# Patient Record
Sex: Male | Born: 1950
Health system: Southern US, Community
[De-identification: ages and names within clinical notes are randomized; demographics above are authoritative.]

## PROBLEM LIST (undated history)

## (undated) DIAGNOSIS — I5020 Unspecified systolic (congestive) heart failure: Secondary | ICD-10-CM

## (undated) DIAGNOSIS — Z8739 Personal history of other diseases of the musculoskeletal system and connective tissue: Secondary | ICD-10-CM

## (undated) DIAGNOSIS — D689 Coagulation defect, unspecified: Secondary | ICD-10-CM

## (undated) DIAGNOSIS — R51 Headache: Secondary | ICD-10-CM

## (undated) DIAGNOSIS — J45909 Unspecified asthma, uncomplicated: Secondary | ICD-10-CM

## (undated) DIAGNOSIS — I639 Cerebral infarction, unspecified: Secondary | ICD-10-CM

## (undated) DIAGNOSIS — R519 Headache, unspecified: Secondary | ICD-10-CM

## (undated) DIAGNOSIS — I671 Cerebral aneurysm, nonruptured: Secondary | ICD-10-CM

## (undated) DIAGNOSIS — I509 Heart failure, unspecified: Secondary | ICD-10-CM

## (undated) DIAGNOSIS — R0602 Shortness of breath: Secondary | ICD-10-CM

## (undated) DIAGNOSIS — M1990 Unspecified osteoarthritis, unspecified site: Secondary | ICD-10-CM

## (undated) DIAGNOSIS — Z973 Presence of spectacles and contact lenses: Secondary | ICD-10-CM

## (undated) DIAGNOSIS — D573 Sickle-cell trait: Secondary | ICD-10-CM

## (undated) DIAGNOSIS — K08109 Complete loss of teeth, unspecified cause, unspecified class: Secondary | ICD-10-CM

## (undated) DIAGNOSIS — Z972 Presence of dental prosthetic device (complete) (partial): Secondary | ICD-10-CM

## (undated) DIAGNOSIS — I10 Essential (primary) hypertension: Secondary | ICD-10-CM

## (undated) HISTORY — PX: NM PET  LYMPHOMA INITIAL: HXRAD81

## (undated) HISTORY — PX: MULTIPLE TOOTH EXTRACTIONS: SHX2053

## (undated) HISTORY — PX: CARDIAC CATHETERIZATION: SHX172

## (undated) HISTORY — DX: Unspecified systolic (congestive) heart failure: I50.20

## (undated) HISTORY — PX: HERNIA REPAIR: SHX51

---
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
## 1988-12-24 DIAGNOSIS — I639 Cerebral infarction, unspecified: Secondary | ICD-10-CM

## 1988-12-24 HISTORY — DX: Cerebral infarction, unspecified: I63.9

## 1989-01-11 DIAGNOSIS — I671 Cerebral aneurysm, nonruptured: Secondary | ICD-10-CM

## 1989-01-11 HISTORY — DX: Cerebral aneurysm, nonruptured: I67.1

## 2013-01-18 ENCOUNTER — Encounter (HOSPITAL_COMMUNITY): Payer: Self-pay | Admitting: Emergency Medicine

## 2013-01-18 ENCOUNTER — Inpatient Hospital Stay (HOSPITAL_COMMUNITY)
Admission: EM | Admit: 2013-01-18 | Discharge: 2013-01-23 | DRG: 286 | Disposition: A | Discharge status: Home / Self Care | Payer: Medicaid Other | Attending: Internal Medicine | Admitting: Internal Medicine

## 2013-01-18 ENCOUNTER — Emergency Department (HOSPITAL_COMMUNITY): Payer: Medicaid Other

## 2013-01-18 DIAGNOSIS — I080 Rheumatic disorders of both mitral and aortic valves: Secondary | ICD-10-CM | POA: Diagnosis present

## 2013-01-18 DIAGNOSIS — I110 Hypertensive heart disease with heart failure: Secondary | ICD-10-CM | POA: Diagnosis present

## 2013-01-18 DIAGNOSIS — I5022 Chronic systolic (congestive) heart failure: Secondary | ICD-10-CM

## 2013-01-18 DIAGNOSIS — I130 Hypertensive heart and chronic kidney disease with heart failure and stage 1 through stage 4 chronic kidney disease, or unspecified chronic kidney disease: Secondary | ICD-10-CM | POA: Diagnosis present

## 2013-01-18 DIAGNOSIS — I2510 Atherosclerotic heart disease of native coronary artery without angina pectoris: Secondary | ICD-10-CM | POA: Diagnosis present

## 2013-01-18 DIAGNOSIS — N400 Enlarged prostate without lower urinary tract symptoms: Secondary | ICD-10-CM | POA: Diagnosis present

## 2013-01-18 DIAGNOSIS — I2789 Other specified pulmonary heart diseases: Secondary | ICD-10-CM | POA: Diagnosis present

## 2013-01-18 DIAGNOSIS — J45909 Unspecified asthma, uncomplicated: Secondary | ICD-10-CM

## 2013-01-18 DIAGNOSIS — F1210 Cannabis abuse, uncomplicated: Secondary | ICD-10-CM | POA: Diagnosis present

## 2013-01-18 DIAGNOSIS — I5023 Acute on chronic systolic (congestive) heart failure: Principal | ICD-10-CM | POA: Diagnosis present

## 2013-01-18 DIAGNOSIS — J811 Chronic pulmonary edema: Secondary | ICD-10-CM

## 2013-01-18 DIAGNOSIS — I5021 Acute systolic (congestive) heart failure: Secondary | ICD-10-CM

## 2013-01-18 DIAGNOSIS — I129 Hypertensive chronic kidney disease with stage 1 through stage 4 chronic kidney disease, or unspecified chronic kidney disease: Secondary | ICD-10-CM | POA: Diagnosis present

## 2013-01-18 DIAGNOSIS — J984 Other disorders of lung: Secondary | ICD-10-CM

## 2013-01-18 DIAGNOSIS — Z9119 Patient's noncompliance with other medical treatment and regimen: Secondary | ICD-10-CM | POA: Diagnosis present

## 2013-01-18 DIAGNOSIS — I509 Heart failure, unspecified: Secondary | ICD-10-CM

## 2013-01-18 DIAGNOSIS — I10 Essential (primary) hypertension: Secondary | ICD-10-CM

## 2013-01-18 DIAGNOSIS — Z91199 Patient's noncompliance with other medical treatment and regimen due to unspecified reason: Secondary | ICD-10-CM | POA: Diagnosis present

## 2013-01-18 DIAGNOSIS — N182 Chronic kidney disease, stage 2 (mild): Secondary | ICD-10-CM

## 2013-01-18 DIAGNOSIS — R0603 Acute respiratory distress: Secondary | ICD-10-CM

## 2013-01-18 DIAGNOSIS — N183 Chronic kidney disease, stage 3 unspecified: Secondary | ICD-10-CM | POA: Diagnosis present

## 2013-01-18 DIAGNOSIS — Z87891 Personal history of nicotine dependence: Secondary | ICD-10-CM

## 2013-01-18 DIAGNOSIS — I379 Nonrheumatic pulmonary valve disorder, unspecified: Secondary | ICD-10-CM

## 2013-01-18 DIAGNOSIS — M129 Arthropathy, unspecified: Secondary | ICD-10-CM | POA: Diagnosis present

## 2013-01-18 DIAGNOSIS — N179 Acute kidney failure, unspecified: Secondary | ICD-10-CM

## 2013-01-18 DIAGNOSIS — I079 Rheumatic tricuspid valve disease, unspecified: Secondary | ICD-10-CM | POA: Diagnosis present

## 2013-01-18 DIAGNOSIS — I160 Hypertensive urgency: Secondary | ICD-10-CM

## 2013-01-18 DIAGNOSIS — Z8673 Personal history of transient ischemic attack (TIA), and cerebral infarction without residual deficits: Secondary | ICD-10-CM | POA: Diagnosis present

## 2013-01-18 DIAGNOSIS — J9620 Acute and chronic respiratory failure, unspecified whether with hypoxia or hypercapnia: Secondary | ICD-10-CM | POA: Diagnosis present

## 2013-01-18 DIAGNOSIS — D573 Sickle-cell trait: Secondary | ICD-10-CM | POA: Diagnosis present

## 2013-01-18 HISTORY — DX: Unspecified osteoarthritis, unspecified site: M19.90

## 2013-01-18 HISTORY — DX: Unspecified asthma, uncomplicated: J45.909

## 2013-01-18 HISTORY — DX: Heart failure, unspecified: I50.9

## 2013-01-18 HISTORY — DX: Essential (primary) hypertension: I10

## 2013-01-18 HISTORY — DX: Sickle-cell trait: D57.3

## 2013-01-18 HISTORY — DX: Shortness of breath: R06.02

## 2013-01-18 HISTORY — DX: Cerebral infarction, unspecified: I63.9

## 2013-01-18 LAB — CBC
MCV: 93.9 fL (ref 78.0–100.0)
Platelets: 232 10*3/uL (ref 150–400)
RBC: 4.43 MIL/uL (ref 4.22–5.81)
RDW: 14.4 % (ref 11.5–15.5)
WBC: 12 10*3/uL — ABNORMAL HIGH (ref 4.0–10.5)

## 2013-01-18 LAB — BASIC METABOLIC PANEL
Calcium: 9.3 mg/dL (ref 8.4–10.5)
GFR calc Af Amer: 52 mL/min — ABNORMAL LOW (ref >90)
GFR calc non Af Amer: 45 mL/min — ABNORMAL LOW (ref >90)
Glucose, Bld: 135 mg/dL — ABNORMAL HIGH (ref 70–99)
Sodium: 141 mEq/L (ref 135–145)

## 2013-01-18 LAB — CBC WITH DIFFERENTIAL/PLATELET
Basophils Absolute: 0 10*3/uL (ref 0.0–0.1)
Basophils Relative: 0 % (ref 0–1)
Eosinophils Absolute: 0.1 10*3/uL (ref 0.0–0.7)
Eosinophils Relative: 1 % (ref 0–5)
HCT: 41.7 % (ref 39.0–52.0)
Lymphocytes Relative: 15 % (ref 12–46)
MCH: 31.8 pg (ref 26.0–34.0)
MCHC: 33.8 g/dL (ref 30.0–36.0)
MCV: 93.9 fL (ref 78.0–100.0)
Platelets: 272 10*3/uL (ref 150–400)
RDW: 14.5 % (ref 11.5–15.5)
WBC: 12.8 10*3/uL — ABNORMAL HIGH (ref 4.0–10.5)

## 2013-01-18 LAB — TROPONIN I
Troponin I: <0.3 ng/mL (ref <0.30)
Troponin I: <0.3 ng/mL (ref <0.30)

## 2013-01-18 LAB — TSH: TSH: 1.124 u[IU]/mL (ref 0.350–4.500)

## 2013-01-18 LAB — POCT I-STAT, CHEM 8
Calcium, Ion: 1.19 mmol/L (ref 1.13–1.30)
HCT: 46 % (ref 39.0–52.0)
Hemoglobin: 15.6 g/dL (ref 13.0–17.0)
TCO2: 30 mmol/L (ref 0–100)

## 2013-01-18 LAB — POCT I-STAT TROPONIN I: Troponin i, poc: 0.02 ng/mL (ref 0.00–0.08)

## 2013-01-18 LAB — CREATININE, SERUM
Creatinine, Ser: 1.56 mg/dL — ABNORMAL HIGH (ref 0.50–1.35)
GFR calc Af Amer: 54 mL/min — ABNORMAL LOW (ref >90)

## 2013-01-18 LAB — URINALYSIS, ROUTINE W REFLEX MICROSCOPIC
Hgb urine dipstick: NEGATIVE
Protein, ur: NEGATIVE mg/dL
Urobilinogen, UA: 0.2 mg/dL (ref 0.0–1.0)
pH: 7 (ref 5.0–8.0)

## 2013-01-18 LAB — PRO B NATRIURETIC PEPTIDE: Pro B Natriuretic peptide (BNP): 6048 pg/mL — ABNORMAL HIGH (ref 0–125)

## 2013-01-18 MED ORDER — ONDANSETRON HCL 4 MG/2ML IJ SOLN: 4.0000 mg | Freq: Four times a day (QID) | INTRAMUSCULAR | Status: DC | PRN

## 2013-01-18 MED ORDER — PNEUMOCOCCAL VAC POLYVALENT 25 MCG/0.5ML IJ INJ
0.5000 mL | INJECTION | INTRAMUSCULAR | Status: AC
  Administered 2013-01-20: 0.5 mL via INTRAMUSCULAR
  Filled 2013-01-18 (×2): qty 0.5

## 2013-01-18 MED ORDER — ASPIRIN EC 325 MG PO TBEC: 325.0000 mg | DELAYED_RELEASE_TABLET | Freq: Every day | ORAL | Status: DC

## 2013-01-18 MED ORDER — SODIUM CHLORIDE 0.9 % IV SOLN: 250.0000 mL | INTRAVENOUS | Status: DC | PRN

## 2013-01-18 MED ORDER — POTASSIUM CHLORIDE CRYS ER 20 MEQ PO TBCR: 40.0000 meq | EXTENDED_RELEASE_TABLET | Freq: Every day | ORAL | Status: DC

## 2013-01-18 MED ORDER — HEPARIN SODIUM (PORCINE) 5000 UNIT/ML IJ SOLN
5000.0000 [IU] | Freq: Three times a day (TID) | INTRAMUSCULAR | Status: DC
  Administered 2013-01-18 – 2013-01-23 (×14): 5000 [IU] via SUBCUTANEOUS
  Filled 2013-01-18 (×19): qty 1

## 2013-01-18 MED ORDER — SODIUM CHLORIDE 0.9 % IJ SOLN
3.0000 mL | Freq: Two times a day (BID) | INTRAMUSCULAR | Status: DC
  Administered 2013-01-18 – 2013-01-20 (×2): 3 mL via INTRAVENOUS

## 2013-01-18 MED ORDER — FUROSEMIDE 10 MG/ML IJ SOLN
80.0000 mg | Freq: Once | INTRAMUSCULAR | Status: AC
  Administered 2013-01-18: 80 mg via INTRAVENOUS
  Filled 2013-01-18: qty 8

## 2013-01-18 MED ORDER — POTASSIUM CHLORIDE CRYS ER 20 MEQ PO TBCR
40.0000 meq | EXTENDED_RELEASE_TABLET | Freq: Every day | ORAL | Status: DC
  Administered 2013-01-18 – 2013-01-23 (×6): 40 meq via ORAL
  Filled 2013-01-18 (×6): qty 2

## 2013-01-18 MED ORDER — INFLUENZA VAC SPLIT QUAD 0.5 ML IM SUSP
0.5000 mL | INTRAMUSCULAR | Status: DC
  Filled 2013-01-18: qty 0.5

## 2013-01-18 MED ORDER — FUROSEMIDE 10 MG/ML IJ SOLN
INTRAMUSCULAR | Status: AC
  Filled 2013-01-18: qty 4

## 2013-01-18 MED ORDER — FUROSEMIDE 10 MG/ML IJ SOLN
40.0000 mg | Freq: Once | INTRAMUSCULAR | Status: AC
  Administered 2013-01-18: 40 mg via INTRAVENOUS
  Filled 2013-01-18: qty 4

## 2013-01-18 MED ORDER — ONDANSETRON HCL 4 MG PO TABS: 4.0000 mg | ORAL_TABLET | Freq: Four times a day (QID) | ORAL | Status: DC | PRN

## 2013-01-18 MED ORDER — CARVEDILOL 6.25 MG PO TABS
6.2500 mg | ORAL_TABLET | Freq: Two times a day (BID) | ORAL | Status: DC
  Administered 2013-01-18 – 2013-01-23 (×11): 6.25 mg via ORAL
  Filled 2013-01-18 (×13): qty 1

## 2013-01-18 MED ORDER — DOCUSATE SODIUM 100 MG PO CAPS
100.0000 mg | ORAL_CAPSULE | Freq: Two times a day (BID) | ORAL | Status: DC
  Administered 2013-01-20: 100 mg via ORAL
  Filled 2013-01-18 (×12): qty 1

## 2013-01-18 MED ORDER — SODIUM CHLORIDE 0.9 % IJ SOLN: 3.0000 mL | INTRAMUSCULAR | Status: DC | PRN

## 2013-01-18 MED ORDER — FUROSEMIDE 40 MG PO TABS
40.0000 mg | ORAL_TABLET | Freq: Every day | ORAL | Status: DC
  Filled 2013-01-18: qty 1

## 2013-01-18 MED ORDER — ASPIRIN 81 MG PO CHEW
324.0000 mg | CHEWABLE_TABLET | Freq: Once | ORAL | Status: AC
  Administered 2013-01-18: 324 mg via ORAL
  Filled 2013-01-18: qty 4

## 2013-01-18 MED ORDER — NITROGLYCERIN 2 % TD OINT
1.0000 [in_us] | TOPICAL_OINTMENT | Freq: Once | TRANSDERMAL | Status: AC
  Administered 2013-01-18: 1 [in_us] via TOPICAL
  Filled 2013-01-18: qty 1

## 2013-01-18 MED ORDER — FUROSEMIDE 10 MG/ML IJ SOLN: 40.0000 mg | Freq: Two times a day (BID) | INTRAMUSCULAR | Status: DC

## 2013-01-18 MED ORDER — SODIUM CHLORIDE 0.9 % IJ SOLN
3.0000 mL | Freq: Two times a day (BID) | INTRAMUSCULAR | Status: DC
  Administered 2013-01-18 – 2013-01-22 (×7): 3 mL via INTRAVENOUS

## 2013-01-18 MED ORDER — DOXAZOSIN MESYLATE 8 MG PO TABS
8.0000 mg | ORAL_TABLET | Freq: Every day | ORAL | Status: DC
  Administered 2013-01-18 – 2013-01-21 (×4): 8 mg via ORAL
  Filled 2013-01-18 (×5): qty 1

## 2013-01-18 MED ORDER — METOPROLOL TARTRATE 1 MG/ML IV SOLN
2.5000 mg | INTRAVENOUS | Status: DC | PRN
  Administered 2013-01-18: 2.5 mg via INTRAVENOUS
  Filled 2013-01-18: qty 5

## 2013-01-18 MED ORDER — ASPIRIN 81 MG PO CHEW
81.0000 mg | CHEWABLE_TABLET | Freq: Every day | ORAL | Status: DC
  Administered 2013-01-18 – 2013-01-23 (×6): 81 mg via ORAL
  Filled 2013-01-18 (×5): qty 1

## 2013-01-18 MED ORDER — FUROSEMIDE 10 MG/ML IJ SOLN
40.0000 mg | Freq: Two times a day (BID) | INTRAMUSCULAR | Status: DC
  Administered 2013-01-19 – 2013-01-23 (×8): 40 mg via INTRAVENOUS
  Filled 2013-01-18 (×12): qty 4

## 2013-01-18 NOTE — Progress Notes (Signed)
  Echocardiogram 2D Echocardiogram has been performed.  [name] [name] 01/18/2013, 4:27 PM

## 2013-01-18 NOTE — ED Notes (Signed)
MD at bedside. 

## 2013-01-18 NOTE — H&P (Signed)
[facility] History and Physical  Tudor Chandley ZOX:[id number] DOB: [birth year]    PCP:   None.  Chief Complaint: shortness of breath and orthopnea.  HPI: [name] [name] is an 62 y.o. male with hx of longstanding HTN on Lisinopril/HCT combination for many years, hx of asthma, prior CVA, presents to the ER as he has progressive increase shortness of breath, increased DOE, and orthopnea for the past 3 weeks.  He denied chest pain, fever, chills or sputum productive coughs.  He was seen at the ER in [place], [place], where he used to live in March 2014, and was diagnosed with fluid overload where upon he was given Lasix 40mg  per day.  He continued to take this diuretic since then, without any blood work.  As far as he can tell, he never had any "kidney problem" before. He also has been taking Moloxicam for his arthritis.  He has hip pain and was contemplating hip surgery.   Evalaution in the ER tonight showed CXR with cardiomegaly, Cr of 1.8, K of 3.3 mEq/L, and EKG showed nonspecific ST T changes.  His BNP was elevated to 6K, and troponin was negative.  He was given 80mg  IV lasix, with some improvement of his breathing, and he started to diurese. He was found to be hypertensive with BP 170/130.  Hospitalist was asked to admit him for HTN CMP and pulmonary edema.  Rewiew of Systems:  Constitutional: Negative for malaise, fever and chills. No significant weight loss or weight gain Eyes: Negative for eye pain, redness and discharge, diplopia, visual changes, or flashes of light. ENMT: Negative for ear pain, hoarseness, nasal congestion, sinus pressure and sore throat. No headaches; tinnitus, drooling, or problem swallowing. Cardiovascular: Negative for chest pain, palpitations, diaphoresis. Respiratory: Negative for cough, hemoptysis, wheezing and stridor. No pleuritic chestpain. Gastrointestinal: Negative for nausea, vomiting, diarrhea, constipation, abdominal pain, melena, blood in stool,  hematemesis, jaundice and rectal bleeding.    Genitourinary: Negative for frequency, dysuria, incontinence,flank pain and hematuria; Musculoskeletal: Negative for back pain and neck pain. Negative for swelling and trauma.;  Skin: . Negative for pruritus, rash, abrasions, bruising and skin lesion.; ulcerations Neuro: Negative for headache, lightheadedness and neck stiffness. Negative for weakness, altered level of consciousness , altered mental status, extremity weakness, burning feet, involuntary movement, seizure and syncope.  Psych: negative for anxiety, depression, insomnia, tearfulness, panic attacks, hallucinations, paranoia, suicidal or homicidal ideation.   Past Medical History  Diagnosis Date  . Hypertension   . Stroke   . Asthma     Past Surgical History  Procedure Laterality Date  . Hernia repair    . Nm pet  lymphoma initial      Medications:  HOME MEDS: Prior to Admission medications   Medication Sig Start Date End Date Taking? Authorizing Provider  doxazosin (CARDURA) 8 MG tablet Take 8 mg by mouth daily.   Yes Historical Provider, MD  furosemide (LASIX) 40 MG tablet Take 40 mg by mouth daily.   Yes Historical Provider, MD  lisinopril-hydrochlorothiazide (PRINZIDE,ZESTORETIC) 20-25 MG per tablet Take 1 tablet by mouth daily.   Yes Historical Provider, MD  MAGNESIUM PO Take 1 tablet by mouth daily. OTC   Yes Historical Provider, MD  meloxicam (MOBIC) 15 MG tablet Take 15 mg by mouth daily.   Yes Historical Provider, MD  POTASSIUM PO Take 1 tablet by mouth daily. OTC   Yes Historical Provider, MD     Allergies:  No Known Allergies  Social History:   reports that  he has quit smoking. He does not have any smokeless tobacco history on file. He reports that he does not drink alcohol or use illicit drugs.  Family History: No family history on file.   Physical Exam: Filed Vitals:   01/18/13 0226 01/18/13 0230 01/18/13 0300 01/18/13 0403  BP: 165/108 150/107 170/60  170/100  Pulse: 98 96 96   Temp: 97.6 F (36.4 C)  98 F (36.7 C) 98 F (36.7 C)  TempSrc: Oral  Oral Oral  Resp: 24 33 23 18  SpO2: 93% 99% 98% 100%   Blood pressure 170/100, pulse 96, temperature 98 F (36.7 C), temperature source Oral, resp. rate 18, SpO2 100.00%.  GEN:  Pleasant patient lying in the stretcher in no acute distress; cooperative with exam. PSYCH:  alert and oriented x4; does not appear anxious or depressed; affect is appropriate. HEENT: Mucous membranes pink and anicteric; PERRLA; EOM intact; no cervical lymphadenopathy nor thyromegaly or carotid bruit; no JVD; There were no stridor. Neck is very supple. Breasts:: Not examined CHEST WALL: No tenderness CHEST: Normal respiration, No wheezing but he has bilateral rales. HEART: Regular rate and rhythm.  There are no murmur, rub, or gallops.   BACK: No kyphosis or scoliosis; no CVA tenderness ABDOMEN: soft and non-tender; no masses, no organomegaly, normal abdominal bowel sounds; no pannus; no intertriginous candida. There is no rebound and no distention. Rectal Exam: Not done EXTREMITIES: No bone or joint deformity; age-appropriate arthropathy of the hands and knees; no edema; no ulcerations.  There is no calf tenderness. Genitalia: not examined PULSES: 2+ and symmetric SKIN: Normal hydration no rash or ulceration CNS: Cranial nerves 2-12 grossly intact no focal lateralizing neurologic deficit.  Speech is fluent; uvula elevated with phonation, facial symmetry and tongue midline. DTR are normal bilaterally, cerebella exam is intact, barbinski is negative and strengths are equaled bilaterally.  No sensory loss.   Labs on Admission:  Basic Metabolic Panel:  Recent Labs Lab 01/18/13 0228 01/18/13 0230  NA 144 141  K 3.3* 3.3*  CL 101 100  CO2  --  30  GLUCOSE 136* 135*  BUN 24* 25*  CREATININE 1.80* 1.62*  CALCIUM  --  9.3   Liver Function Tests: No results found for this basename: AST, ALT, ALKPHOS, BILITOT,  PROT, ALBUMIN,  in the last 168 hours No results found for this basename: LIPASE, AMYLASE,  in the last 168 hours No results found for this basename: AMMONIA,  in the last 168 hours CBC:  Recent Labs Lab 01/18/13 0221 01/18/13 0228  WBC 12.8*  --   NEUTROABS 10.0*  --   HGB 14.1 15.6  HCT 41.7 46.0  MCV 93.9  --   PLT 272  --    Cardiac Enzymes: No results found for this basename: CKTOTAL, CKMB, CKMBINDEX, TROPONINI,  in the last 168 hours  CBG: No results found for this basename: GLUCAP,  in the last 168 hours   Radiological Exams on Admission: Dg Chest Port 1 View  01/18/2013   CLINICAL DATA:  Shortness of breath, history of hypertension, stroke and asthma.  EXAM: PORTABLE CHEST - 1 VIEW  COMPARISON:  None available for comparison at time of study interpretation.  FINDINGS: The cardiac silhouette appears mild to moderately enlarged. Mediastinal silhouette is unremarkable. Linear densities in right lung base. No pleural effusions or focal consolidations. No pneumothorax.  Soft tissue planes and included osseous structures are nonsuspicious. Moderate degenerative change of the right shoulder. Multiple EKG lines overlie the patient  and may obscure subtle underlying pathology.  IMPRESSION: Cardiomegaly ; right lower lobe atelectasis versus scarring.   Electronically Signed   By: Awilda Metro   On: 01/18/2013 02:46    EKG: Independently reviewed. NSR with nonspecific ST-T segment changes.   Assessment/Plan Present on Admission:  . CHF (congestive heart failure) . HTN (hypertension) . Asthma . AKI (acute kidney injury)  PLAN:  His shortness of breath is concerning for CHF.  I suspect his main problem is that of hypertensive cardiomyopathy, but I cannot exclude ischemic CMP.  His AKI is likely a combination of Moloxicam, ACE-I, and could be cardiorenal syndrome.  Unfortunately, if he has CKD, his baseline GFR is unclear.  I don't think his shortness of breath is his asthma  though he has hx of it.  Will admit him for further Tx.  For his HTN, will continue Cardura, and add Coreg at 6.25mg  BID to his regimen.  I will replenish his K, and d/c his lisinopril Hct and d/c Mobic.  He will need an ECHO of his heart, TSH, and will add nitropaste to his regimen for now.  Will obtain UA.  Currently he is stable, full code, and will be admitted to telemetry under [facility] service.  We will proceed to further evaluation at this time.  Record from [place] has been requested but will not be available at this time.  Thank you for asking me to participate in his care.  I explained in detail about his condition to him and his family, and all questions were answered.    Other plans as per orders.  Code Status: FULL Unk Lightning, MD. [facility] Pager [phone] 7pm to 7am.  01/18/2013, 4:21 AM

## 2013-01-18 NOTE — ED Notes (Signed)
Manly, MD at bedside.  

## 2013-01-18 NOTE — ED Notes (Signed)
Old records on 01/2012 unable to be obtain till the am dr Nedra Hai aware

## 2013-01-18 NOTE — ED Notes (Addendum)
Sob, congested in throat, heaviness, productive cough - clear. No fever/chills. When laying supine feels more sob. Noticed some swelling in ankles. Symptoms x 2 weeks.

## 2013-01-18 NOTE — ED Provider Notes (Signed)
CSN: [id number]     Arrival date & time 01/18/13  0210 History   First MD Initiated Contact with Patient 01/18/13 0214     Chief Complaint  Patient presents with  . Shortness of Breath   (Consider location/radiation/quality/duration/timing/severity/associated sxs/prior Treatment) HPI Mr. Breton is a pleasant 62 yo man with a history of HTN, stroke and asthma who presents with complaints of approximately 8 hrs of increasingly severe SOB. The patient reports a mild cough productive of frothy sputum. He denies chest pain. Denies fever. He has had 2 weeks of orthopnea and PND which has significantly interfered with his sleep.   Patient has noticed lower extremity dependent edema for the past 3 days and doubled his dose of lasix yesterday and the day before.  Denies any other changes in medications.   The patient reports that he recently moved to [place] from [facility], [place] and does not yet have a physician. He reports that he was admitted to [facility] in [place] earlier in the year for treatment of similar sx and started on diuretic at discharge. But, says he has not been told of a dx of CHF.   Past Medical History  Diagnosis Date  . Hypertension   . Stroke   . Asthma    Past Surgical History  Procedure Laterality Date  . Hernia repair    . Nm pet  lymphoma initial     No family history on file. History  Substance Use Topics  . Smoking status: Former Games developer  . Smokeless tobacco: Not on file  . Alcohol Use: No    Review of Systems Ten point review of symptoms performed and is negative with the exception of symptoms noted above.   Allergies  Review of patient's allergies indicates no known allergies.  Home Medications  Physical Exam Gen: well developed and well nourished appearing Head: NCAT Eyes: PERL, EOMI Nose: no epistaixis or rhinorrhea Mouth/throat: mucosa is moist and pink Neck: supple, no stridor Lungs: fiant bibiasilar rales, RR 28 to 32/min with  use of abdominal musculature, no wheezing, rhonchi or rales CV: rapid and regular rate and rythm, good distal pulses.  Abd: soft, obese, notender, nondistended Back: no ttp, no cva ttp Skin: warm and dry Ext: no edema, normal to inspection Neuro: CN ii-xii grossly intact, no focal deficits Psyche; normal affect,  calm and cooperative. ED Course  Procedures (including critical care time) Labs Review      DG Chest Port 1 View (Final result)  Result time: 01/18/13 02:46:22    Procedure changed from [facility] Chest Portable 2 Views       Final result by Rad Results In Interface (01/18/13 02:46:22)    Narrative:   CLINICAL DATA: Shortness of breath, history of hypertension, stroke and asthma.  EXAM: PORTABLE CHEST - 1 VIEW  COMPARISON: None available for comparison at time of study interpretation.  FINDINGS: The cardiac silhouette appears mild to moderately enlarged. Mediastinal silhouette is unremarkable. Linear densities in right lung base. No pleural effusions or focal consolidations. No pneumothorax.  Soft tissue planes and included osseous structures are nonsuspicious. Moderate degenerative change of the right shoulder. Multiple EKG lines overlie the patient and may obscure subtle underlying pathology.  IMPRESSION: Cardiomegaly ; right lower lobe atelectasis versus scarring.  Results for orders placed during the hospital encounter of 01/18/13 (from the past 24 hour(s))  CBC WITH DIFFERENTIAL     Status: Abnormal   Collection Time    01/18/13  2:21 AM  Result Value Range   WBC 12.8 (*) 4.0 - 10.5 K/uL   RBC 4.44  4.22 - 5.81 MIL/uL   Hemoglobin 14.1  13.0 - 17.0 g/dL   HCT 40.9  81.1 - 91.4 %   MCV 93.9  78.0 - 100.0 fL   MCH 31.8  26.0 - 34.0 pg   MCHC 33.8  30.0 - 36.0 g/dL   RDW 78.2  95.6 - 21.3 %   Platelets 272  150 - 400 K/uL   Neutrophils Relative % 78 (*) 43 - 77 %   Neutro Abs 10.0 (*) 1.7 - 7.7 K/uL   Lymphocytes Relative 15  12 - 46 %   Lymphs Abs  1.9  0.7 - 4.0 K/uL   Monocytes Relative 6  3 - 12 %   Monocytes Absolute 0.8  0.1 - 1.0 K/uL   Eosinophils Relative 1  0 - 5 %   Eosinophils Absolute 0.1  0.0 - 0.7 K/uL   Basophils Relative 0  0 - 1 %   Basophils Absolute 0.0  0.0 - 0.1 K/uL  PRO B NATRIURETIC PEPTIDE     Status: Abnormal   Collection Time    01/18/13  2:21 AM      Result Value Range   Pro B Natriuretic peptide (BNP) 6048.0 (*) 0 - 125 pg/mL  POCT I-STAT TROPONIN I     Status: None   Collection Time    01/18/13  2:27 AM      Result Value Range   Troponin i, poc 0.02  0.00 - 0.08 ng/mL   Comment 3           POCT I-STAT, CHEM 8     Status: Abnormal   Collection Time    01/18/13  2:28 AM      Result Value Range   Sodium 144  135 - 145 mEq/L   Potassium 3.3 (*) 3.5 - 5.1 mEq/L   Chloride 101  96 - 112 mEq/L   BUN 24 (*) 6 - 23 mg/dL   Creatinine, Ser 0.86 (*) 0.50 - 1.35 mg/dL   Glucose, Bld 578 (*) 70 - 99 mg/dL   Calcium, Ion 4.69  6.29 - 1.30 mmol/L   TCO2 30  0 - 100 mmol/L   Hemoglobin 15.6  13.0 - 17.0 g/dL   HCT 52.8  41.3 - 24.4 %    Electronically Signed By: Awilda Metro On: 01/18/2013 02:46    MDM    CRITICAL CARE Performed by: Brandt Loosen   Total critical care time 83m  Critical care time was exclusive of separately billable procedures and treating other patients.  Critical care was necessary to treat or prevent imminent or life-threatening deterioration.  Critical care was time spent personally by me on the following activities: development of treatment plan with patient and/or surrogate as well as nursing, discussions with consultants, evaluation of patient's response to treatment, examination of patient, obtaining history from patient or surrogate, ordering and performing treatments and interventions, ordering and review of laboratory studies, ordering and review of radiographic studies, pulse oximetry and re-evaluation of patient's condition.  Patient with acute respiratory  distress and hypertension. Hx and exam most consistent with pulmonary edema, although the patient denies any knowledge of history of cardiomyopathy. We are treating HTN with NTG and treating with IV Lasix. Awaiting results of BNP. First troponin is wnl and there are no acute ischemic changes identified on the patient's EKG. He is afebrile. Although, his sx could be consistent with  influenza/ILI.  We will check a d dimer as well. Anticipate admission.   I have requested copy of the patient's 2014 discharge summary from [facility] in [place], [place].     Brandt Loosen, MD 01/31/13 [phone]

## 2013-01-18 NOTE — Progress Notes (Signed)
Patient seen and examined admitted after midnight secondary to SOB and increase LE swelling. Patient with vasc congestion on CXR and elevated BNP. He reported medication non compliance and diet indiscretion. Referred to Dr. Irwin Brakeman H&P for further info/details on admission.   Plan: -continue IV lasix -follow 2D echo -Strict I's and O's -daily weights and low sodium diet   [name] [phone]

## 2013-01-19 DIAGNOSIS — J811 Chronic pulmonary edema: Secondary | ICD-10-CM

## 2013-01-19 DIAGNOSIS — I509 Heart failure, unspecified: Secondary | ICD-10-CM

## 2013-01-19 DIAGNOSIS — I5021 Acute systolic (congestive) heart failure: Secondary | ICD-10-CM

## 2013-01-19 DIAGNOSIS — N179 Acute kidney failure, unspecified: Secondary | ICD-10-CM

## 2013-01-19 LAB — BASIC METABOLIC PANEL
CO2: 32 mEq/L (ref 19–32)
Chloride: 101 mEq/L (ref 96–112)
Creatinine, Ser: 1.5 mg/dL — ABNORMAL HIGH (ref 0.50–1.35)
GFR calc Af Amer: 57 mL/min — ABNORMAL LOW (ref >90)
Potassium: 3.3 mEq/L — ABNORMAL LOW (ref 3.5–5.1)
Sodium: 141 mEq/L (ref 135–145)

## 2013-01-19 MED ORDER — LIVING BETTER WITH HEART FAILURE BOOK
Freq: Once | Status: AC
  Administered 2013-01-23: 15:00:00
  Filled 2013-01-19: qty 1

## 2013-01-19 MED ORDER — ISOSORB DINITRATE-HYDRALAZINE 20-37.5 MG PO TABS
1.0000 | ORAL_TABLET | Freq: Two times a day (BID) | ORAL | Status: DC
  Administered 2013-01-19 – 2013-01-21 (×6): 1 via ORAL
  Filled 2013-01-19 (×11): qty 1

## 2013-01-19 NOTE — Progress Notes (Signed)
At 2132 pt had 7 beats of Vtach/ SVT. Pt asymptomatic. Strip posted and M. Burnadette Peter, NP made aware. No new orders. Cont to monitor.

## 2013-01-19 NOTE — Consult Note (Addendum)
CONSULT NOTE  Date: 01/19/2013               Patient Name:  [name] [name] MRN: [id number]  DOB: 1950-11-26 Age / Sex: 62 y.o., male        PCP: No PCP Per Patient Primary Cardiologist: New/Nahser            Referring Physician: Vassie Loll, MD              Reason for Consult: CHf           History of Present Illness: Patient is a 62 y.o. male  From [place]. with a PMHx of hypertension, asthma, previous CVA, cerebral aneurism, who was admitted to [facility] on 01/18/2013 for evaluation of progressive shortness of breath, orthopnea for the past several weeks. He denies any chest pain.[name] He has mild to moderate chronic renal insufficiency at baseline.  He's previously been diagnosed with congestive heart failure when he lived out of [place]. He received Lasix but apparently has not been taking it on a regular basis..  An echocardiogram performed yesterday reveals moderately reduced left ventricle systolic function with an ejection fraction of 35-40%. He has diffuse hypokinesis. There is mild aortic insufficiency, mild mitral regurgitation, moderate tricuspid regurgitation and moderate pulmonic insufficiency. There is moderate pulmonary hypertension with an estimated PA pressure of 49.  He has a long hx of HTN - skips his meds frequently.   He runs out of meds.  He eats salty foods.   Rarely eats fast foods.  He prefers to do his own cooking.   He recently moved from [place].  He reports 2-3 weeks of progressive dyspnea.   No fever.  Has had a cough. White , frothy sputum.     Medications: Outpatient medications: Prescriptions prior to admission  Medication Sig Dispense Refill  . doxazosin (CARDURA) 8 MG tablet Take 8 mg by mouth daily.      . furosemide (LASIX) 40 MG tablet Take 40 mg by mouth daily.      [name] lisinopril-hydrochlorothiazide (PRINZIDE,ZESTORETIC) 20-25 MG per tablet Take 1 tablet by mouth daily.      [name] MAGNESIUM PO Take 1 tablet by mouth daily. OTC      .  meloxicam (MOBIC) 15 MG tablet Take 15 mg by mouth daily.      [name] POTASSIUM PO Take 1 tablet by mouth daily. OTC        Current medications: Current Facility-Administered Medications  Medication Dose Route Frequency Provider Last Rate Last Dose  . 0.9 %  sodium chloride infusion  250 mL Intravenous PRN Houston Siren, MD      . aspirin chewable tablet 81 mg  81 mg Oral Daily Vassie Loll, MD   81 mg at 01/19/13 1135  . carvedilol (COREG) tablet 6.25 mg  6.25 mg Oral BID WC Houston Siren, MD   6.25 mg at 01/19/13 4098  . docusate sodium (COLACE) capsule 100 mg  100 mg Oral BID Houston Siren, MD      . doxazosin (CARDURA) tablet 8 mg  8 mg Oral Daily Houston Siren, MD   8 mg at 01/19/13 1135  . furosemide (LASIX) injection 40 mg  40 mg Intravenous Q12H Vassie Loll, MD   40 mg at 01/19/13 0610  . heparin injection 5,000 Units  5,000 Units Subcutaneous Q8H Houston Siren, MD   5,000 Units at 01/19/13 [phone]  . influenza vac split quadrivalent PF (FLUARIX) injection 0.5 mL  0.5 mL  Intramuscular Tomorrow-1000 Houston Siren, MD      . isosorbide-hydrALAZINE (BIDIL) 20-37.5 MG per tablet 1 tablet  1 tablet Oral BID Vassie Loll, MD   1 tablet at 01/19/13 1318  . ondansetron (ZOFRAN) tablet 4 mg  4 mg Oral Q6H PRN Houston Siren, MD       Or  . ondansetron [facility]) injection 4 mg  4 mg Intravenous Q6H PRN Houston Siren, MD      . pneumococcal 23 valent vaccine (PNU-IMMUNE) injection 0.5 mL  0.5 mL Intramuscular Tomorrow-1000 Houston Siren, MD      . potassium chloride SA (K-DUR,KLOR-CON) CR tablet 40 mEq  40 mEq Oral Daily Houston Siren, MD   40 mEq at 01/19/13 1135  . sodium chloride 0.9 % injection 3 mL  3 mL Intravenous Q12H Houston Siren, MD   3 mL at 01/18/13 1051  . sodium chloride 0.9 % injection 3 mL  3 mL Intravenous Q12H Houston Siren, MD   3 mL at 01/19/13 1146  . sodium chloride 0.9 % injection 3 mL  3 mL Intravenous PRN Houston Siren, MD         No Known Allergies   Past Medical History  Diagnosis Date  . Hypertension   . CHF (congestive heart  failure)     "just today" (01/18/2013)  . Asthma     'as a child"   . Shortness of breath     "comes on at anytime" (01/18/2013)  . Sickle cell trait   . Stroke 12/1988  . Arthritis     "left hip; right knee" (01/18/2013)    Past Surgical History  Procedure Laterality Date  . 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 Hernia repair  2004?    "above the navel" (01/18/2013)  . Cardiac catheterization  12/1988    History reviewed. No pertinent family history.  Social History:  reports that he has never smoked. He has never used smokeless tobacco. He reports that he drinks alcohol. He reports that he uses illicit drugs (Marijuana).   Review of Systems: Constitutional:  denies fever, chills, diaphoresis, appetite change and fatigue.  HEENT: denies photophobia, eye pain, redness, hearing loss, ear pain, congestion, sore throat, rhinorrhea, sneezing, neck pain, neck stiffness and tinnitus.  Respiratory: admits to SOB, DOE, cough, chest tightness, and wheezing.  Cardiovascular: denies chest pain, palpitations and leg swelling.  Gastrointestinal: denies nausea, vomiting, abdominal pain, diarrhea, constipation, blood in stool.  Genitourinary: denies dysuria, urgency, frequency, hematuria, flank pain and difficulty urinating.  Musculoskeletal: denies  myalgias, back pain, joint swelling, arthralgias and gait problem.   Skin: denies pallor, rash and wound.  Neurological: denies dizziness, seizures, syncope, weakness, light-headedness, numbness and headaches.   Hematological: denies adenopathy, easy bruising, personal or family bleeding history.  Psychiatric/ Behavioral: denies suicidal ideation, mood changes, confusion, nervousness, sleep disturbance and agitation.    Physical Exam: BP 141/97  Pulse 67  Temp(Src) 97.5 F (36.4 C) (Oral)  Resp 17  Ht 6\' 4"  (1.93 m)  Wt 269 lb (122.018 kg)  BMI 32.76 kg/m2  SpO2 93%  General: Vital signs reviewed and noted. Well-developed,  well-nourished, in no acute distress; alert, appropriate and cooperative throughout examination.  Head: Normocephalic, atraumatic, sclera anicteric, mucus membranes are moist  Neck: Supple. Negative for carotid bruits. JVD not elevated.  Lungs:  Clear bilaterally to auscultation without wheezes, rales, or rhonchi. Breathing is unlabored.  Heart: RRR with S1 S2. No murmurs, rubs, or gallops  Abdomen:  Soft, non-tender, non-distended with normoactive bowel sounds. No hepatomegaly. No rebound/guarding. No obvious abdominal masses  MSK: Strength and the appear normal for age.  Extremities: No clubbing or cyanosis. No edema.  Distal pedal pulses are 2+ and equal bilaterally.  Neurologic: Alert and oriented X 3. Moves all extremities spontaneously.  Psych: Responds to questions appropriately with a normal affect.    Lab results: Basic Metabolic Panel:  Recent Labs Lab 01/18/13 0228 01/18/13 0230 01/18/13 0645  NA 144 141  --   K 3.3* 3.3*  --   CL 101 100  --   CO2  --  30  --   GLUCOSE 136* 135*  --   BUN 24* 25*  --   CREATININE 1.80* 1.62* 1.56*  CALCIUM  --  9.3  --     Liver Function Tests: No results found for this basename: AST, ALT, ALKPHOS, BILITOT, PROT, ALBUMIN,  in the last 168 hours No results found for this basename: LIPASE, AMYLASE,  in the last 168 hours No results found for this basename: AMMONIA,  in the last 168 hours  CBC:  Recent Labs Lab 01/18/13 0221 01/18/13 0228 01/18/13 0645  WBC 12.8*  --  12.0*  NEUTROABS 10.0*  --   --   HGB 14.1 15.6 13.9  HCT 41.7 46.0 41.6  MCV 93.9  --  93.9  PLT 272  --  232    Cardiac Enzymes:  Recent Labs Lab 01/18/13 0645 01/18/13 1158 01/18/13 1916  TROPONINI <0.30 <0.30 <0.30    BNP: No components found with this basename: POCBNP,   CBG: No results found for this basename: GLUCAP,  in the last 168 hours  Coagulation Studies: No results found for this basename: LABPROT, INR,  in the last 72  hours   Other results:  EK    Imaging: Dg Chest Port 1 View  01/18/2013   CLINICAL DATA:  Shortness of breath, history of hypertension, stroke and asthma.  EXAM: PORTABLE CHEST - 1 VIEW  COMPARISON:  None available for comparison at time of study interpretation.  FINDINGS: The cardiac silhouette appears mild to moderately enlarged. Mediastinal silhouette is unremarkable. Linear densities in right lung base. No pleural effusions or focal consolidations. No pneumothorax.  Soft tissue planes and included osseous structures are nonsuspicious. Moderate degenerative change of the right shoulder. Multiple EKG lines overlie the patient and may obscure subtle underlying pathology.  IMPRESSION: Cardiomegaly ; right lower lobe atelectasis versus scarring.   Electronically Signed   By: Awilda Metro   On: 01/18/2013 02:46      Last Echo (01/18/13) Left ventricle: The cavity size was moderately dilated. Wall thickness was increased in a pattern of moderate LVH. Systolic function was moderately reduced. The estimated ejection fraction was in the range of 35% to 40%. Diffuse hypokinesis. Regional wall motion abnormalities cannot be excluded. The study is not technically sufficient to allow evaluation of LV diastolic function. - Aortic valve: Mild regurgitation. - Mitral valve: Mild regurgitation. - Left atrium: The atrium was mildly dilated. - Right ventricle: The cavity size was mildly dilated. - Right atrium: The atrium was mildly dilated. - Tricuspid valve: Moderate regurgitation. - Pulmonic valve: Moderate regurgitation. - Pulmonary arteries: PA peak pressure: 49mm Hg      Assessment & Plan:  1. Acute on Chronic systolic congestive heart failure:  The patient presents with acute on chronic systolic congestive heart failure. His ejection fraction by echo was 35-40%. I suspect that the etiology is  due to long-standing, poorly controlled hypertension. He also takes nonsteroidal  anti-inflammatory agents.  He's never had episodes of angina but I cannot completely exclude coronary artery disease.  He is already feeling better having received Lasix. He has had some leg edema which has since resolved.  We will schedule him for a right left heart catheterization on Monday. We will start him on carvedilol. We will resume his lisinopril. We will also continue Lasix and will likely at Aldactone.  I have asked him to check with his medical Dr. to see if he can arthritis medications that are not nonsteroidal anti-inflammatory agents.  We talked about the importance of sticking to a good diet and exercise program. He does have lots of arthritis in his right knee and left hip which seem to limit his activity.  2. Hypertension: The patient has long-standing hypertension. He admits to not taking his medications all the time. I stressed the importance of a good low-salt diet.  3. Chronic renal insufficiency: I suspect this is also due to is chronic hypertension. He also has been taking nonsteroidal anti-inflammatory agents for quite some time. Further evaluation per the internal medicine team.  Alvia Grove., MD, [facility] 01/19/2013, 2:44 PM

## 2013-01-19 NOTE — Progress Notes (Signed)
[facility] PROGRESS NOTE  Filed Weights   01/18/13 0513 01/19/13 0500  Weight: 122.743 kg (270 lb 9.6 oz) 122.018 kg (269 lb)        Intake/Output Summary (Last 24 hours) at 01/19/13 1531 Last data filed at 01/19/13 1300  Gross per 24 hour  Intake    600 ml  Output   2075 ml  Net  -1475 ml     Assessment/Plan: 1-acute resp failure with hypoxia due to acute on chronic systolic heart failure: -continue daily weight, strict I's and O's and low sodium diet -will continue IV lasix one more day and transition to PO/adjust dose base on clinical response -start bidil BID (better BP controlled and improved perfusion) -will hold lisinopril until patient received heart cath; at that time if renal function remains stable will resume -PRN oxygen saturation -follow cardiology rec's -daily BMET and electrolytes repletion as needed  2-HTN (hypertension): uncontrolled. Will add bidil. Continue lasix and coreg. Plan as per cardiology,  is for aldactone and lisinopril after cath if renal function stable.  3-Asthma: no wheezing and SOB improved with diuresis. -continue PRN albuterol  4-AKI (acute kidney injury): unknown of baseline creatinine but suspect underline renal disease base on GFR.  -will monitor for now -continue holding lisinopril -Cr has improved some with diuresis.  5-BPH: continue cardura  DVT: heparin    Code Status: Full Family Communication: wife at bedside Disposition Plan: right and left cath on Monday, continue inpatient therapy   Consultants:  Cardiology (Dr. Elease Hashimoto)  Procedures: ECHO: diffuse hypokinesis, EF 35-40%; valvular disease with mild AI, MR and moderate tricuspid regurgitation and pulmonic insufficiency  Antibiotics:  None   HPI/Subjective: Feeling better and breathing better. No edema on his legs.  Objective: Filed Vitals:   01/19/13 0500 01/19/13 0917 01/19/13 1112 01/19/13 1344  BP: 137/106 157/114 138/100 141/97  Pulse: 71 72 72  67  Temp: 98.5 F (36.9 C)   97.5 F (36.4 C)  TempSrc:    Oral  Resp: 20   17  Height:      Weight: 122.018 kg (269 lb)     SpO2: 99%   93%     Exam:  General: Alert, awake, oriented x3, in no acute distress; reports he is breathing better.  HEENT: No bruits, no goiter and no JVD Heart: Regular rate and rhythm, without rubs or gallops. Positive SEM. No edema on LE Lungs: improved air movement bilaterally, still mild decreased at bases; no frank crackles  Abdomen: Soft, nontender, nondistended, positive bowel sounds.  Neuro: Grossly intact, nonfocal.   Data Reviewed: Basic Metabolic Panel:  Recent Labs Lab 01/18/13 0228 01/18/13 0230 01/18/13 0645 01/19/13 1320  NA 144 141  --  141  K 3.3* 3.3*  --  3.3*  CL 101 100  --  101  CO2  --  30  --  32  GLUCOSE 136* 135*  --  90  BUN 24* 25*  --  27*  CREATININE 1.80* 1.62* 1.56* 1.50*  CALCIUM  --  9.3  --  8.6   CBC:  Recent Labs Lab 01/18/13 0221 01/18/13 0228 01/18/13 0645  WBC 12.8*  --  12.0*  NEUTROABS 10.0*  --   --   HGB 14.1 15.6 13.9  HCT 41.7 46.0 41.6  MCV 93.9  --  93.9  PLT 272  --  232   Cardiac Enzymes:  Recent Labs Lab 01/18/13 0645 01/18/13 1158 01/18/13 1916  TROPONINI <0.30 <0.30 <0.30   BNP (last  3 results)  Recent Labs  01/18/13 0221  PROBNP 6048.0*    Studies: Dg Chest Port 1 View  01/18/2013   CLINICAL DATA:  Shortness of breath, history of hypertension, stroke and asthma.  EXAM: PORTABLE CHEST - 1 VIEW  COMPARISON:  None available for comparison at time of study interpretation.  FINDINGS: The cardiac silhouette appears mild to moderately enlarged. Mediastinal silhouette is unremarkable. Linear densities in right lung base. No pleural effusions or focal consolidations. No pneumothorax.  Soft tissue planes and included osseous structures are nonsuspicious. Moderate degenerative change of the right shoulder. Multiple EKG lines overlie the patient and may obscure subtle  underlying pathology.  IMPRESSION: Cardiomegaly ; right lower lobe atelectasis versus scarring.   Electronically Signed   By: Awilda Metro   On: 01/18/2013 02:46    Scheduled Meds: . aspirin  81 mg Oral Daily  . carvedilol  6.25 mg Oral BID WC  . docusate sodium  100 mg Oral BID  . doxazosin  8 mg Oral Daily  . furosemide  40 mg Intravenous Q12H  . heparin  5,000 Units Subcutaneous Q8H  . influenza vac split quadrivalent PF  0.5 mL Intramuscular Tomorrow-1000  . isosorbide-hydrALAZINE  1 tablet Oral BID  . pneumococcal 23 valent vaccine  0.5 mL Intramuscular Tomorrow-1000  . potassium chloride  40 mEq Oral Daily  . sodium chloride  3 mL Intravenous Q12H  . sodium chloride  3 mL Intravenous Q12H   Continuous Infusions:    Declin Rajan  [facility] Pager [phone]. If 8PM-8AM, please contact night-coverage at [email or web address], password [facility] 01/19/2013, 3:31 PM  LOS: 1 day

## 2013-01-20 DIAGNOSIS — J9600 Acute respiratory failure, unspecified whether with hypoxia or hypercapnia: Secondary | ICD-10-CM

## 2013-01-20 DIAGNOSIS — I10 Essential (primary) hypertension: Secondary | ICD-10-CM

## 2013-01-20 LAB — BASIC METABOLIC PANEL
BUN: 27 mg/dL — ABNORMAL HIGH (ref 6–23)
Chloride: 102 mEq/L (ref 96–112)
Creatinine, Ser: 1.43 mg/dL — ABNORMAL HIGH (ref 0.50–1.35)
GFR calc Af Amer: 60 mL/min — ABNORMAL LOW (ref >90)
Sodium: 141 mEq/L (ref 135–145)

## 2013-01-20 LAB — MAGNESIUM: Magnesium: 2 mg/dL (ref 1.5–2.5)

## 2013-01-20 LAB — GLUCOSE, CAPILLARY: Glucose-Capillary: 133 mg/dL — ABNORMAL HIGH (ref 70–99)

## 2013-01-20 MED ORDER — ACETAMINOPHEN 325 MG PO TABS
650.0000 mg | ORAL_TABLET | Freq: Once | ORAL | Status: AC
  Administered 2013-01-20: 650 mg via ORAL
  Filled 2013-01-20: qty 2

## 2013-01-20 MED ORDER — SODIUM CHLORIDE 0.9 % IJ SOLN
3.0000 mL | Freq: Two times a day (BID) | INTRAMUSCULAR | Status: DC
  Administered 2013-01-20: 3 mL via INTRAVENOUS

## 2013-01-20 MED ORDER — ASPIRIN 81 MG PO CHEW
81.0000 mg | CHEWABLE_TABLET | ORAL | Status: AC
  Administered 2013-01-21: 81 mg via ORAL
  Filled 2013-01-20: qty 1

## 2013-01-20 MED ORDER — SODIUM CHLORIDE 0.9 % IV SOLN
1.0000 mL/kg/h | INTRAVENOUS | Status: DC
  Administered 2013-01-21: 0.164 mL/kg/h via INTRAVENOUS

## 2013-01-20 MED ORDER — ACETAMINOPHEN 325 MG PO TABS
650.0000 mg | ORAL_TABLET | Freq: Four times a day (QID) | ORAL | Status: DC | PRN
  Administered 2013-01-20 – 2013-01-23 (×4): 650 mg via ORAL
  Filled 2013-01-20 (×4): qty 2

## 2013-01-20 MED ORDER — SODIUM CHLORIDE 0.9 % IV SOLN: 250.0000 mL | INTRAVENOUS | Status: DC | PRN

## 2013-01-20 MED ORDER — SODIUM CHLORIDE 0.9 % IJ SOLN: 3.0000 mL | INTRAMUSCULAR | Status: DC | PRN

## 2013-01-20 NOTE — Progress Notes (Signed)
At 0311 pt had 16 beats of Vtach/ SVT.  Pt asymptomatic. BP 140/106. Strip posted and M. Burnadette Peter, NP made aware. No new orders. Cont to monitor.

## 2013-01-20 NOTE — Progress Notes (Signed)
[facility] PROGRESS NOTE  Filed Weights   01/18/13 0513 01/19/13 0500 01/20/13 0500  Weight: 122.743 kg (270 lb 9.6 oz) 122.018 kg (269 lb) 122.743 kg (270 lb 9.6 oz)        Intake/Output Summary (Last 24 hours) at 01/20/13 1027 Last data filed at 01/20/13 0920  Gross per 24 hour  Intake   1200 ml  Output   2250 ml  Net  -1050 ml     Assessment/Plan: 1-acute resp failure with hypoxia due to acute on chronic systolic heart failure: -continue daily weight, strict I's and O's and low sodium diet -will continue IV lasix one more day and transition to PO/adjust dose base on clinical response -started bidil BID (better BP controlled and improved perfusion) -will cont to hold lisinopril until patient received heart cath; at that time if renal function remains stable will resume -PRN oxygen saturation -daily BMET and electrolytes repletion as needed -Cardiology recs for heart cath tomorrow  2-HTN (hypertension): uncontrolled. Will add bidil. Continue lasix and coreg. Plan as per cardiology, consider aldactone and lisinopril after cath if renal function stable.  3-Asthma: no wheezing and SOB improved with diuresis. -continue PRN albuterol  4-AKI (acute kidney injury): unknown of baseline creatinine but suspect underline renal disease base on GFR.  -will monitor for now -continue holding lisinopril -Cr has improved some with diuresis.  5-BPH: continue cardura  DVT: heparin    Code Status: Full Family Communication: Pt in room Disposition Plan: right and left cath on Monday, continue inpatient therapy   Consultants:  Cardiology (Dr. Elease Hashimoto)  Procedures: ECHO: diffuse hypokinesis, EF 35-40%; valvular disease with mild AI, MR and moderate tricuspid regurgitation and pulmonic insufficiency  Antibiotics:  None   HPI/Subjective: No acute events noted overnight.  Objective: Filed Vitals:   01/19/13 1344 01/19/13 1742 01/19/13 2100 01/20/13 0500  BP: 141/97  147/88 138/102 149/102  Pulse: 67 73 70 73  Temp: 97.5 F (36.4 C)  97.5 F (36.4 C) 97.5 F (36.4 C)  TempSrc: Oral     Resp: 17  20 20   Height:      Weight:    122.743 kg (270 lb 9.6 oz)  SpO2: 93%  98% 98%     Exam:  General: Alert, awake, oriented x3, in no acute distress; reports he is breathing better.  HEENT: No bruits, no goiter and no JVD Heart: Regular rate and rhythm, without rubs or gallops. Positive SEM. No edema on LE Lungs: improved air movement bilaterally, still mild decreased at bases; no frank crackles  Abdomen: Soft, nontender, nondistended, positive bowel sounds.  Neuro: Grossly intact, nonfocal.   Data Reviewed: Basic Metabolic Panel:  Recent Labs Lab 01/18/13 0228 01/18/13 0230 01/18/13 0645 01/19/13 1320 01/20/13 0600  NA 144 141  --  141 141  K 3.3* 3.3*  --  3.3* 3.4*  CL 101 100  --  101 102  CO2  --  30  --  32 31  GLUCOSE 136* 135*  --  90 115*  BUN 24* 25*  --  27* 27*  CREATININE 1.80* 1.62* 1.56* 1.50* 1.43*  CALCIUM  --  9.3  --  8.6 8.7  MG  --   --   --   --  2.0   CBC:  Recent Labs Lab 01/18/13 0221 01/18/13 0228 01/18/13 0645  WBC 12.8*  --  12.0*  NEUTROABS 10.0*  --   --   HGB 14.1 15.6 13.9  HCT 41.7 46.0 41.6  MCV  93.9  --  93.9  PLT 272  --  232   Cardiac Enzymes:  Recent Labs Lab 01/18/13 0645 01/18/13 1158 01/18/13 1916  TROPONINI <0.30 <0.30 <0.30   BNP (last 3 results)  Recent Labs  01/18/13 0221  PROBNP 6048.0*    Studies: No results found.  Scheduled Meds: . aspirin  81 mg Oral Daily  . carvedilol  6.25 mg Oral BID WC  . docusate sodium  100 mg Oral BID  . doxazosin  8 mg Oral Daily  . furosemide  40 mg Intravenous Q12H  . heparin  5,000 Units Subcutaneous Q8H  . influenza vac split quadrivalent PF  0.5 mL Intramuscular Tomorrow-1000  . isosorbide-hydrALAZINE  1 tablet Oral BID  . Living Better with Heart Failure Book   Does not apply Once  . pneumococcal 23 valent vaccine  0.5 mL  Intramuscular Tomorrow-1000  . potassium chloride  40 mEq Oral Daily  . sodium chloride  3 mL Intravenous Q12H  . sodium chloride  3 mL Intravenous Q12H   Continuous Infusions:    CHIU, STEPHEN K  [facility] Pager [phone]. If 8PM-8AM, please contact night-coverage at [email or web address], password [facility] 01/20/2013, 10:27 AM  LOS: 2 days

## 2013-01-20 NOTE — Progress Notes (Addendum)
Progress  NOTE  Date: 01/20/2013               Patient Name:  [name] [name] MRN: [id number]  DOB: 1950-08-14 Age / Sex: 62 y.o., male        PCP: No PCP Per Patient Primary Cardiologist: New/Layza Summa            Referring Physician: Vassie Loll, MD              Reason for Consult: CHf          History of Present Illness: Patient is a 62 y.o. male  From [place]. with a PMHx of hypertension, asthma, previous CVA, cerebral aneurism, who was admitted to [facility] on 01/18/2013 for evaluation of progressive shortness of breath, orthopnea for the past several weeks. He denies any chest pain.[name] He has mild to moderate chronic renal insufficiency at baseline.  He has diuresed well overnight    Medications: Outpatient medications: Prescriptions prior to admission  Medication Sig Dispense Refill  . doxazosin (CARDURA) 8 MG tablet Take 8 mg by mouth daily.      . furosemide (LASIX) 40 MG tablet Take 40 mg by mouth daily.      [name] lisinopril-hydrochlorothiazide (PRINZIDE,ZESTORETIC) 20-25 MG per tablet Take 1 tablet by mouth daily.      [name] MAGNESIUM PO Take 1 tablet by mouth daily. OTC      . meloxicam (MOBIC) 15 MG tablet Take 15 mg by mouth daily.      [name] POTASSIUM PO Take 1 tablet by mouth daily. OTC        Current medications: Current Facility-Administered Medications  Medication Dose Route Frequency Provider Last Rate Last Dose  . 0.9 %  sodium chloride infusion  250 mL Intravenous PRN Houston Siren, MD      . aspirin chewable tablet 81 mg  81 mg Oral Daily Vassie Loll, MD   81 mg at 01/19/13 1135  . carvedilol (COREG) tablet 6.25 mg  6.25 mg Oral BID WC Houston Siren, MD   6.25 mg at 01/19/13 1746  . docusate sodium (COLACE) capsule 100 mg  100 mg Oral BID Houston Siren, MD      . doxazosin (CARDURA) tablet 8 mg  8 mg Oral Daily Houston Siren, MD   8 mg at 01/19/13 1135  . furosemide (LASIX) injection 40 mg  40 mg Intravenous Q12H Vassie Loll, MD   40 mg at 01/20/13 0549  . heparin  injection 5,000 Units  5,000 Units Subcutaneous Q8H Houston Siren, MD   5,000 Units at 01/20/13 0549  . influenza vac split quadrivalent PF (FLUARIX) injection 0.5 mL  0.5 mL Intramuscular Tomorrow-1000 Houston Siren, MD      . isosorbide-hydrALAZINE (BIDIL) 20-37.5 MG per tablet 1 tablet  1 tablet Oral BID Vassie Loll, MD   1 tablet at 01/19/13 2237  . Living Better with Heart Failure Book   Does not apply Once Vassie Loll, MD      . ondansetron [facility]) tablet 4 mg  4 mg Oral Q6H PRN Houston Siren, MD       Or  . ondansetron [facility]) injection 4 mg  4 mg Intravenous Q6H PRN Houston Siren, MD      . pneumococcal 23 valent vaccine (PNU-IMMUNE) injection 0.5 mL  0.5 mL Intramuscular Tomorrow-1000 Houston Siren, MD      . potassium chloride SA (K-DUR,KLOR-CON) CR tablet 40 mEq  40 mEq Oral Daily Houston Siren, MD  40 mEq at 01/19/13 1135  . sodium chloride 0.9 % injection 3 mL  3 mL Intravenous Q12H Houston Siren, MD   3 mL at 01/18/13 1051  . sodium chloride 0.9 % injection 3 mL  3 mL Intravenous Q12H Houston Siren, MD   3 mL at 01/19/13 2229  . sodium chloride 0.9 % injection 3 mL  3 mL Intravenous PRN Houston Siren, MD         No Known Allergies   Past Medical History  Diagnosis Date  . Hypertension   . CHF (congestive heart failure)     "just today" (01/18/2013)  . Asthma     'as a child"   . Shortness of breath     "comes on at anytime" (01/18/2013)  . Sickle cell trait   . Stroke 12/1988  . Arthritis     "left hip; right knee" (01/18/2013)    Past Surgical History  Procedure Laterality Date  . 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 Hernia repair  2004?    "above the navel" (01/18/2013)  . Cardiac catheterization  12/1988    History reviewed. No pertinent family history.  Social History:  reports that he has never smoked. He has never used smokeless tobacco. He reports that he drinks alcohol. He reports that he uses illicit drugs (Marijuana).     Physical Exam: BP 149/102  Pulse 73  Temp(Src)  97.5 F (36.4 C) (Oral)  Resp 20  Ht 6\' 4"  (1.93 m)  Wt 270 lb 9.6 oz (122.743 kg)  BMI 32.95 kg/m2  SpO2 98%  General: Vital signs reviewed and noted. Well-developed, well-nourished, in no acute distress; alert, appropriate and cooperative throughout examination.  Head: Normocephalic, atraumatic, sclera anicteric, mucus membranes are moist  Neck: Supple. Negative for carotid bruits. JVD not elevated.  Lungs:  Clear bilaterally to auscultation without wheezes, rales, or rhonchi. Breathing is unlabored.  Heart: RRR with S1 S2. No murmurs, rubs, or gallops    Abdomen:  Soft, non-tender, non-distended with normoactive bowel sounds. No hepatomegaly. No rebound/guarding. No obvious abdominal masses  MSK: Strength and the appear normal for age.  Extremities: No clubbing or cyanosis. No edema.  Distal pedal pulses are 2+ and equal bilaterally.  Neurologic: Alert and oriented X 3. Moves all extremities spontaneously.  Psych: Responds to questions appropriately with a normal affect.    Lab results: Basic Metabolic Panel:  Recent Labs Lab 01/18/13 0230 01/18/13 0645 01/19/13 1320 01/20/13 0600  NA 141  --  141 141  K 3.3*  --  3.3* 3.4*  CL 100  --  101 102  CO2 30  --  32 31  GLUCOSE 135*  --  90 115*  BUN 25*  --  27* 27*  CREATININE 1.62* 1.56* 1.50* 1.43*  CALCIUM 9.3  --  8.6 8.7  MG  --   --   --  2.0    Liver Function Tests: No results found for this basename: AST, ALT, ALKPHOS, BILITOT, PROT, ALBUMIN,  in the last 168 hours No results found for this basename: LIPASE, AMYLASE,  in the last 168 hours No results found for this basename: AMMONIA,  in the last 168 hours  CBC:  Recent Labs Lab 01/18/13 0221 01/18/13 0228 01/18/13 0645  WBC 12.8*  --  12.0*  NEUTROABS 10.0*  --   --   HGB 14.1 15.6 13.9  HCT 41.7 46.0 41.6  MCV 93.9  --  93.9  PLT 272  --  232    Cardiac Enzymes:  Recent Labs Lab 01/18/13 0645 01/18/13 1158 01/18/13 1916  TROPONINI <0.30 <0.30  <0.30    BNP: No components found with this basename: POCBNP,   CBG: No results found for this basename: GLUCAP,  in the last 168 hours  Coagulation Studies: No results found for this basename: LABPROT, INR,  in the last 72 hours   Other results:  EK    Imaging: No results found.   Last Echo (01/18/13) Left ventricle: The cavity size was moderately dilated. Wall thickness was increased in a pattern of moderate LVH. Systolic function was moderately reduced. The estimated ejection fraction was in the range of 35% to 40%. Diffuse hypokinesis. Regional wall motion abnormalities cannot be excluded. The study is not technically sufficient to allow evaluation of LV diastolic function. - Aortic valve: Mild regurgitation. - Mitral valve: Mild regurgitation. - Left atrium: The atrium was mildly dilated. - Right ventricle: The cavity size was mildly dilated. - Right atrium: The atrium was mildly dilated. - Tricuspid valve: Moderate regurgitation. - Pulmonic valve: Moderate regurgitation. - Pulmonary arteries: PA peak pressure: 49mm Hg      Assessment & Plan:  1. Acute on Chronic systolic congestive heart failure:  The patient presents with acute on chronic systolic congestive heart failure. His ejection fraction by echo was 35-40%. I suspect that the etiology is due to long-standing, poorly controlled hypertension. He also takes nonsteroidal anti-inflammatory agents.  He's never had episodes of angina but I cannot completely exclude coronary artery disease.  He is already feeling better having received Lasix. He has had some leg edema which has since resolved.  We will schedule him for a right left heart catheterization on Monday. We will start him on carvedilol. We will resume his lisinopril. We will also continue Lasix and will likely at Aldactone.  He reports having a "blood clotting disorder" .  He has had other surgical procedures in the past ( ie hernia repair) without  problems.   I do not think this will be an issue.    2. Hypertension: The patient has long-standing hypertension. He admits to not taking his medications all the time. I stressed the importance of a good low-salt diet.  3. Chronic renal insufficiency: I suspect this is also due to is chronic hypertension. He also has been taking nonsteroidal anti-inflammatory agents for quite some time. Further evaluation per the internal medicine team.  4.  "clotting disorder"  He informed me this am that he has "thin blood" similar to hemophilia.  He does not know the name of his disease.  He has had hernia surgery in the past without problems.    [name] [name], Montez Hageman., MD, [facility] 01/20/2013, 9:10 AM

## 2013-01-21 ENCOUNTER — Encounter (HOSPITAL_COMMUNITY)
Admission: EM | Disposition: A | Discharge status: Home / Self Care | Payer: Self-pay | Source: Home / Self Care | Attending: Internal Medicine

## 2013-01-21 DIAGNOSIS — I428 Other cardiomyopathies: Secondary | ICD-10-CM

## 2013-01-21 DIAGNOSIS — I2510 Atherosclerotic heart disease of native coronary artery without angina pectoris: Secondary | ICD-10-CM

## 2013-01-21 HISTORY — PX: LEFT AND RIGHT HEART CATHETERIZATION WITH CORONARY ANGIOGRAM: SHX5449

## 2013-01-21 LAB — POCT I-STAT 3, VENOUS BLOOD GAS (G3P V)
Bicarbonate: 30.9 mEq/L — ABNORMAL HIGH (ref 20.0–24.0)
Bicarbonate: 31.1 mEq/L — ABNORMAL HIGH (ref 20.0–24.0)
O2 Saturation: 33 %
O2 Saturation: 39 %
TCO2: 32 mmol/L (ref 0–100)
TCO2: 33 mmol/L (ref 0–100)
pCO2, Ven: 50.7 mmHg — ABNORMAL HIGH (ref 45.0–50.0)
pCO2, Ven: 51.6 mmHg — ABNORMAL HIGH (ref 45.0–50.0)
pH, Ven: 7.386 — ABNORMAL HIGH (ref 7.250–7.300)
pH, Ven: 7.396 — ABNORMAL HIGH (ref 7.250–7.300)
pO2, Ven: 21 mmHg — CL (ref 30.0–45.0)
pO2, Ven: 23 mmHg — CL (ref 30.0–45.0)

## 2013-01-21 LAB — BASIC METABOLIC PANEL
BUN: 22 mg/dL (ref 6–23)
CO2: 31 mEq/L (ref 19–32)
Calcium: 8.8 mg/dL (ref 8.4–10.5)
Chloride: 103 mEq/L (ref 96–112)
GFR calc non Af Amer: 58 mL/min — ABNORMAL LOW (ref >90)
Potassium: 3.5 mEq/L (ref 3.5–5.1)
Sodium: 143 mEq/L (ref 135–145)

## 2013-01-21 LAB — POCT I-STAT 3, ART BLOOD GAS (G3+)
Acid-Base Excess: 2 mmol/L (ref 0.0–2.0)
Bicarbonate: 28.4 mEq/L — ABNORMAL HIGH (ref 20.0–24.0)
O2 Saturation: 91 %
TCO2: 30 mmol/L (ref 0–100)
pO2, Arterial: 65 mmHg — ABNORMAL LOW (ref 80.0–100.0)

## 2013-01-21 LAB — PROTIME-INR
INR: 1.12 (ref 0.00–1.49)
Prothrombin Time: 14.2 seconds (ref 11.6–15.2)

## 2013-01-21 SURGERY — LEFT AND RIGHT HEART CATHETERIZATION WITH CORONARY ANGIOGRAM
Anesthesia: LOCAL

## 2013-01-21 MED ORDER — LIDOCAINE HCL (PF) 1 % IJ SOLN
INTRAMUSCULAR | Status: AC
  Filled 2013-01-21: qty 30

## 2013-01-21 MED ORDER — FENTANYL CITRATE 0.05 MG/ML IJ SOLN
INTRAMUSCULAR | Status: AC
Start: 2013-01-21 — End: 2013-01-21
  Filled 2013-01-21: qty 2

## 2013-01-21 MED ORDER — HEPARIN (PORCINE) IN NACL 2-0.9 UNIT/ML-% IJ SOLN
INTRAMUSCULAR | Status: AC
  Filled 2013-01-21: qty 1500

## 2013-01-21 MED ORDER — SODIUM CHLORIDE 0.9 % IV SOLN: INTRAVENOUS | Status: AC

## 2013-01-21 MED ORDER — SODIUM CHLORIDE 0.9 % IV SOLN: INTRAVENOUS | Status: DC

## 2013-01-21 MED ORDER — NITROGLYCERIN 0.2 MG/ML ON CALL CATH LAB
INTRAVENOUS | Status: AC
  Filled 2013-01-21: qty 1

## 2013-01-21 MED ORDER — LABETALOL HCL 5 MG/ML IV SOLN
INTRAVENOUS | Status: AC
  Filled 2013-01-21: qty 4

## 2013-01-21 NOTE — Progress Notes (Signed)
   SUBJECTIVE:  He denies any acute SOB.  No chest pain.     PHYSICAL EXAM Filed Vitals:   01/20/13 0500 01/20/13 1355 01/20/13 2100 01/21/13 0500  BP: 149/102 137/78 159/108 166/95  Pulse: 73 74 76 83  Temp: 97.5 F (36.4 C) 97.7 F (36.5 C) 98.3 F (36.8 C) 97.9 F (36.6 C)  TempSrc:  Oral    Resp: 20 20 18 18  Height:      Weight: 270 lb 9.6 oz (122.743 kg)   272 lb 9.6 oz (123.651 kg)  SpO2: 98% 96% 98% 96%   General:  No distress Neck:  JVD to jaw at 45 degrees Lungs:  Clear Heart:  RRR Abdomen:  Positive bowel sounds, no rebound no guarding Extremities:  No edema Neuro:  Nonfocal  LABS: Lab Results  Component Value Date   TROPONINI <0.30 01/18/2013   Results for orders placed during the hospital encounter of 01/18/13 (from the past 24 hour(s))  GLUCOSE, CAPILLARY     Status: Abnormal   Collection Time    01/20/13  9:11 PM      Result Value Range   Glucose-Capillary 133 (*) 70 - 99 mg/dL  BASIC METABOLIC PANEL     Status: Abnormal   Collection Time    01/21/13  6:40 AM      Result Value Range   Sodium 143  135 - 145 mEq/L   Potassium 3.5  3.5 - 5.1 mEq/L   Chloride 103  96 - 112 mEq/L   CO2 31  19 - 32 mEq/L   Glucose, Bld 105 (*) 70 - 99 mg/dL   BUN 22  6 - 23 mg/dL   Creatinine, Ser 1.31  0.50 - 1.35 mg/dL   Calcium 8.8  8.4 - 10.5 mg/dL   GFR calc non Af Amer 58 (*) >90 mL/min   GFR calc Af Amer 67 (*) >90 mL/min  PROTIME-INR     Status: None   Collection Time    01/21/13  6:40 AM      Result Value Range   Prothrombin Time 14.2  11.6 - 15.2 seconds   INR 1.12  0.00 - 1.49    Intake/Output Summary (Last 24 hours) at 01/21/13 0755 Last data filed at 01/21/13 0600  Gross per 24 hour  Intake    720 ml  Output    900 ml  Net   -180 ml     ASSESSMENT AND PLAN:  ACUTE ON CHRONIC SYSTOLIC AND DIASTOLIC HF:  Right and left heart cath today.  The patient understands that risks included but are not limited to stroke (1 in 1000), death (1 in 1000),  kidney failure [usually temporary] (1 in 500), bleeding (1 in 200), allergic reaction [possibly serious] (1 in 200).  The patient understands and agrees to proceed.   CKD:  CKD is improved.  Limit dye however.    HTN:  He can have his Bidil and Coreg titrated over time.    [name] [name] 01/21/2013 7:55 AM   

## 2013-01-21 NOTE — H&P (View-Only) (Signed)
   SUBJECTIVE:  He denies any acute SOB.  No chest pain.     PHYSICAL EXAM Filed Vitals:   01/20/13 0500 01/20/13 1355 01/20/13 2100 01/21/13 0500  BP: 149/102 137/78 159/108 166/95  Pulse: 73 74 76 83  Temp: 97.5 F (36.4 C) 97.7 F (36.5 C) 98.3 F (36.8 C) 97.9 F (36.6 C)  TempSrc:  Oral    Resp: 20 20 18 18   Height:      Weight: 270 lb 9.6 oz (122.743 kg)   272 lb 9.6 oz (123.651 kg)  SpO2: 98% 96% 98% 96%   General:  No distress Neck:  JVD to jaw at 45 degrees Lungs:  Clear Heart:  RRR Abdomen:  Positive bowel sounds, no rebound no guarding Extremities:  No edema Neuro:  Nonfocal  LABS: Lab Results  Component Value Date   TROPONINI <0.30 01/18/2013   Results for orders placed during the hospital encounter of 01/18/13 (from the past 24 hour(s))  GLUCOSE, CAPILLARY     Status: Abnormal   Collection Time    01/20/13  9:11 PM      Result Value Range   Glucose-Capillary 133 (*) 70 - 99 mg/dL  BASIC METABOLIC PANEL     Status: Abnormal   Collection Time    01/21/13  6:40 AM      Result Value Range   Sodium 143  135 - 145 mEq/L   Potassium 3.5  3.5 - 5.1 mEq/L   Chloride 103  96 - 112 mEq/L   CO2 31  19 - 32 mEq/L   Glucose, Bld 105 (*) 70 - 99 mg/dL   BUN 22  6 - 23 mg/dL   Creatinine, Ser 0.34  0.50 - 1.35 mg/dL   Calcium 8.8  8.4 - 74.2 mg/dL   GFR calc non Af Amer 58 (*) >90 mL/min   GFR calc Af Amer 67 (*) >90 mL/min  PROTIME-INR     Status: None   Collection Time    01/21/13  6:40 AM      Result Value Range   Prothrombin Time 14.2  11.6 - 15.2 seconds   INR 1.12  0.00 - 1.49    Intake/Output Summary (Last 24 hours) at 01/21/13 0755 Last data filed at 01/21/13 0600  Gross per 24 hour  Intake    720 ml  Output    900 ml  Net   -180 ml     ASSESSMENT AND PLAN:  ACUTE ON CHRONIC SYSTOLIC AND DIASTOLIC HF:  Right and left heart cath today.  The patient understands that risks included but are not limited to stroke (1 in 1000), death (1 in 1000),  kidney failure [usually temporary] (1 in 500), bleeding (1 in 200), allergic reaction [possibly serious] (1 in 200).  The patient understands and agrees to proceed.   CKD:  CKD is improved.  Limit dye however.    HTN:  He can have his Bidil and Coreg titrated over time.    Rollene Rotunda 01/21/2013 7:55 AM

## 2013-01-21 NOTE — Progress Notes (Signed)
[facility] PROGRESS NOTE  Filed Weights   01/19/13 0500 01/20/13 0500 01/21/13 0500  Weight: 122.018 kg (269 lb) 122.743 kg (270 lb 9.6 oz) 123.651 kg (272 lb 9.6 oz)        Intake/Output Summary (Last 24 hours) at 01/21/13 1605 Last data filed at 01/21/13 0600  Gross per 24 hour  Intake    480 ml  Output    700 ml  Net   -220 ml     Assessment/Plan: 1-acute resp failure with hypoxia due to acute on chronic systolic heart failure: -continue daily weight, strict I's and O's and low sodium diet -will continue IV lasix one more day and transition to PO/adjust dose base on clinical response -started bidil BID (better BP controlled and improved perfusion) -if renal function remains stable will resume acei -PRN oxygen saturation -daily BMET and electrolytes repletion as needed -Cardiology following - s/p cath today. Results noted.  2-HTN (hypertension): uncontrolled. Will add bidil. Continue lasix and coreg. Plan as per cardiology, consider aldactone and lisinopril after cath if renal function stable.  3-Asthma: no wheezing and SOB improved with diuresis. -continue PRN albuterol  4-AKI (acute kidney injury): unknown of baseline creatinine but suspect underline renal disease base on GFR.  -will monitor for now -continue holding lisinopril -Cr has improved some with diuresis.  5-BPH: continue cardura  DVT: heparin    Code Status: Full Family Communication: Pt in room Disposition Plan: right and left cath on Monday, continue inpatient therapy   Consultants:  Cardiology (Dr. Elease Hashimoto)  Procedures: ECHO: diffuse hypokinesis, EF 35-40%; valvular disease with mild AI, MR and moderate tricuspid regurgitation and pulmonic insufficiency  Antibiotics:  None   HPI/Subjective: No acute events noted overnight.  Objective: Filed Vitals:   01/21/13 1304 01/21/13 1315 01/21/13 1330 01/21/13 1345  BP: 159/112 152/118 176/113 162/110  Pulse:    79  Temp:      TempSrc:       Resp:      Height:      Weight:      SpO2:         Exam:  General: Alert, awake, oriented x3, in no acute distress; reports he is breathing better.  HEENT: No bruits, no goiter and no JVD Heart: Regular rate and rhythm, without rubs or gallops. Positive SEM. No edema on LE Lungs: improved air movement bilaterally, still mild decreased at bases; no frank crackles  Abdomen: Soft, nontender, nondistended, positive bowel sounds.  Neuro: Grossly intact, nonfocal.   Data Reviewed: Basic Metabolic Panel:  Recent Labs Lab 01/18/13 0228 01/18/13 0230 01/18/13 0645 01/19/13 1320 01/20/13 0600 01/21/13 0640  NA 144 141  --  141 141 143  K 3.3* 3.3*  --  3.3* 3.4* 3.5  CL 101 100  --  101 102 103  CO2  --  30  --  32 31 31  GLUCOSE 136* 135*  --  90 115* 105*  BUN 24* 25*  --  27* 27* 22  CREATININE 1.80* 1.62* 1.56* 1.50* 1.43* 1.31  CALCIUM  --  9.3  --  8.6 8.7 8.8  MG  --   --   --   --  2.0  --    CBC:  Recent Labs Lab 01/18/13 0221 01/18/13 0228 01/18/13 0645  WBC 12.8*  --  12.0*  NEUTROABS 10.0*  --   --   HGB 14.1 15.6 13.9  HCT 41.7 46.0 41.6  MCV 93.9  --  93.9  PLT 272  --  232   Cardiac Enzymes:  Recent Labs Lab 01/18/13 0645 01/18/13 1158 01/18/13 1916  TROPONINI <0.30 <0.30 <0.30   BNP (last 3 results)  Recent Labs  01/18/13 0221  PROBNP 6048.0*    Studies: No results found.  Scheduled Meds: . aspirin  81 mg Oral Daily  . carvedilol  6.25 mg Oral BID WC  . docusate sodium  100 mg Oral BID  . doxazosin  8 mg Oral Daily  . furosemide  40 mg Intravenous Q12H  . heparin  5,000 Units Subcutaneous Q8H  . influenza vac split quadrivalent PF  0.5 mL Intramuscular Tomorrow-1000  . isosorbide-hydrALAZINE  1 tablet Oral BID  . Living Better with Heart Failure Book   Does not apply Once  . potassium chloride  40 mEq Oral Daily  . sodium chloride  3 mL Intravenous Q12H  . sodium chloride  3 mL Intravenous Q12H   Continuous Infusions: .  sodium chloride 50 mL/hr at 01/21/13 1315     Zephyr Ridley K  [facility] Pager [phone]. If 8PM-8AM, please contact night-coverage at [email or web address], password [facility] 01/21/2013, 4:05 PM  LOS: 3 days

## 2013-01-21 NOTE — Interval H&P Note (Signed)
History and Physical Interval Note:  01/21/2013 10:09 AM  [name] [name]  has presented today for surgery, with the diagnosis of cp  The various methods of treatment have been discussed with the patient and family. After consideration of risks, benefits and other options for treatment, the patient has consented to  Procedure(s): LEFT AND RIGHT HEART CATHETERIZATION WITH CORONARY ANGIOGRAM (N/A) as a surgical intervention .  The patient's history has been reviewed, patient examined, no change in status, stable for surgery.  I have reviewed the patient's chart and labs.  Questions were answered to the patient's satisfaction.     Rollene Rotunda

## 2013-01-21 NOTE — CV Procedure (Signed)
   Cardiac Catheterization Procedure Note  Name: [name] [name] MRN: [id number] DOB: 1950-07-22  Procedure: Right Heart Cath, Left Heart Cath, Selective Coronary Angiography, LV angiography  Indication:  Cardiomyopathy.  Newly diagnosed.    Procedural Details: The right groin and left groin were prepped, draped, and anesthetized with 1% lidocaine. Using the modified Seldinger technique a 5 French sheath was placed in the right femoral artery and a 7 French sheath was placed in the left femoral vein. A Swan-Ganz catheter was used for the right heart catheterization. Standard protocol was followed for recording of right heart pressures and sampling of oxygen saturations. Fick cardiac output was calculated. Standard Judkins catheters were used for selective coronary angiography. There were no immediate procedural complications. The patient was transferred to the post catheterization recovery area for further monitoring.  I was unable to cannulate the femoral vein from the right side and I had to switch to the left side.    Procedural Findings:  Hemodynamics:               RA 21    RV 55/19    PA 61/21  Mean 42    PCWP 26    LV 146/19  29 EDP    AO 160/117   Oxygen saturations:    PA 36    AO 91%      Cardiac Output (Fick) 3.01                               Cardiac Index (Fick) 1.21   Coronary angiography:  Coronary dominance: right  Left mainstem:   Long and normal.  Left anterior descending (LAD): Normal.  D1 large and normal.    Left circumflex (LCx):   AV groove normal.  OM large and normal.  Right coronary artery (RCA): Very large.  Mild diffuse luminal irregularities.  Long mid 40% stenosis.  AM very large with PDA.  Mild diffuse luminal irregularities.  PL large nd normal  Left ventriculography:   Left ventricle was crossed for pressures but I did not do an LV gram secondary to elevated end diastolic pressure and CKD  Final Conclusions:  Mild coronary plaque.  Elevated  right sided pressures.    Recommendations:  Elevated right sided pressures appear to be related in part to systolic and diastolic heart failure with elevated LVEDP.  However, the elevated right sided pressures were somewhat out of proportion to the EDP elevation.  If he does not improve with usual treatment and lifestyle changes for acute on chronic systolic and diastolic HF I would consider pulmonary vasodilator therapy.    [name] 01/21/2013, 11:08 AM

## 2013-01-22 LAB — BASIC METABOLIC PANEL
BUN: 22 mg/dL (ref 6–23)
CO2: 30 mEq/L (ref 19–32)
Chloride: 102 mEq/L (ref 96–112)
GFR calc Af Amer: 66 mL/min — ABNORMAL LOW (ref >90)
Potassium: 3.7 mEq/L (ref 3.7–5.3)

## 2013-01-22 MED ORDER — LISINOPRIL 5 MG PO TABS
5.0000 mg | ORAL_TABLET | Freq: Every day | ORAL | Status: DC
  Administered 2013-01-22 – 2013-01-23 (×2): 5 mg via ORAL
  Filled 2013-01-22 (×3): qty 1

## 2013-01-22 MED ORDER — DOXAZOSIN MESYLATE 4 MG PO TABS
4.0000 mg | ORAL_TABLET | Freq: Every day | ORAL | Status: DC
  Administered 2013-01-22: 4 mg via ORAL
  Filled 2013-01-22 (×2): qty 1

## 2013-01-22 MED ORDER — ISOSORB DINITRATE-HYDRALAZINE 20-37.5 MG PO TABS
2.0000 | ORAL_TABLET | Freq: Two times a day (BID) | ORAL | Status: DC
  Administered 2013-01-22 – 2013-01-23 (×3): 2 via ORAL
  Filled 2013-01-22 (×4): qty 2

## 2013-01-22 MED ORDER — LISINOPRIL 20 MG PO TABS
20.0000 mg | ORAL_TABLET | Freq: Every day | ORAL | Status: DC
  Filled 2013-01-22: qty 1

## 2013-01-22 NOTE — Care Management Note (Unsigned)
    Page 1 of 1   01/22/2013     10:33:39 AM   CARE MANAGEMENT NOTE 01/22/2013  Patient:  [name] [name]   Account Number:  [id number]  Date Initiated:  01/22/2013  Documentation initiated by:  GRAVES-BIGELOW,Bellami Farrelly  Subjective/Objective Assessment:   Pt admitted for shortness of breath and orthopnea. Treating for CHF and HTN. Pt continues on iv lasix today and possible d/c home Wed.     Action/Plan:   CM will conitnue to monitor for disposiiton needs.   Anticipated DC Date:  01/23/2013   Anticipated DC Plan:  HOME/SELF CARE      DC Planning Services  CM consult      Choice offered to / List presented to:             Status of service:  In process, will continue to follow Medicare Important Message given?   (If response is "NO", the following Medicare IM given date fields will be blank) Date Medicare IM given:   Date Additional Medicare IM given:    Discharge Disposition:    Per UR Regulation:  Reviewed for med. necessity/level of care/duration of stay  If discussed at Long Length of Stay Meetings, dates discussed:    Comments:

## 2013-01-22 NOTE — Plan of Care (Signed)
Problem: Food- and Nutrition-Related Knowledge Deficit (NB-1.1) Goal: Nutrition education Formal process to instruct or train a patient/client in a skill or to impart knowledge to help patients/clients voluntarily manage or modify food choices and eating behavior to maintain or improve health. Outcome: Completed/Met Date Met:  01/22/13 Nutrition Education Note  RD consulted for nutrition education regarding new onset CHF.  RD provided "Low Sodium Nutrition Therapy" handout from the [employer]. Reviewed patient's dietary recall. Provided examples on ways to decrease sodium intake in diet. Discouraged intake of processed foods and use of salt shaker. Encouraged fresh fruits and vegetables as well as whole grain sources of carbohydrates to maximize fiber intake.   RD discussed why it is important for patient to adhere to diet recommendations, and emphasized the role of fluids, foods to avoid, and importance of weighing self daily. Teach back method used.  Expect good compliance.  Body mass index is 33.2 kg/(m^2). Pt meets criteria for obesity, class 1, based on current BMI.  Current diet order is heart healthy, patient is consuming approximately >75% of meals at this time. Labs and medications reviewed. No further nutrition interventions warranted at this time. RD contact information provided. If additional nutrition issues arise, please re-consult RD.   Joaquin Courts, RD, LDN, CNSC Pager [phone] After Hours Pager [phone]

## 2013-01-22 NOTE — Progress Notes (Signed)
[facility] PROGRESS NOTE  Filed Weights   01/19/13 0500 01/20/13 0500 01/21/13 0500  Weight: 122.018 kg (269 lb) 122.743 kg (270 lb 9.6 oz) 123.651 kg (272 lb 9.6 oz)        Intake/Output Summary (Last 24 hours) at 01/22/13 1505 Last data filed at 01/21/13 1934  Gross per 24 hour  Intake      0 ml  Output   1100 ml  Net  -1100 ml     Assessment/Plan: 1-acute resp failure with hypoxia due to acute on chronic systolic heart failure: -continue daily weight, strict I's and O's and low sodium diet -started bidil BID (better BP controlled and improved perfusion) -if renal function remains stable will resume acei -PRN oxygen saturation -daily BMET and electrolytes repletion as needed -Cardiology following - s/p cath today. Results noted.  2-HTN (hypertension): uncontrolled. Added bidil. Continue lasix and coreg. Plan as per cardiology, will resume lisinopril, albeit at lower dose, as renal function stable  3-Asthma: no wheezing and SOB improved with diuresis. -continue PRN albuterol  4-AKI (acute kidney injury): unknown of baseline creatinine but suspect underline renal disease base on GFR.  -will monitor for now -consider resuming lisinopril -Cr has improved some with diuresis.  5-BPH: continue cardura  DVT: heparin    Code Status: Full Family Communication: Pt in room Disposition Plan: poss home in AM   Consultants:  Cardiology (Dr. Elease Hashimoto)  Procedures: ECHO: diffuse hypokinesis, EF 35-40%; valvular disease with mild AI, MR and moderate tricuspid regurgitation and pulmonic insufficiency  Antibiotics:  None   HPI/Subjective: No acute events noted overnight.  Objective: Filed Vitals:   01/21/13 1345 01/21/13 2052 01/22/13 0507 01/22/13 1342  BP: 162/110 151/90 159/99 125/75  Pulse: 79 74 80 85  Temp:  98 F (36.7 C) 97.9 F (36.6 C) 98 F (36.7 C)  TempSrc:  Oral Oral Oral  Resp:  18 18 17   Height:      Weight:      SpO2:  96% 95% 95%      Exam:  General: Alert, awake, oriented x3, in no acute distress; reports he is breathing better.  HEENT: No bruits, no goiter and no JVD Heart: Regular rate and rhythm, without rubs or gallops. Positive SEM. No edema on LE Lungs: improved air movement bilaterally, still mild decreased at bases; no frank crackles  Abdomen: Soft, nontender, nondistended, positive bowel sounds.  Neuro: Grossly intact, nonfocal.   Data Reviewed: Basic Metabolic Panel:  Recent Labs Lab 01/18/13 0230 01/18/13 0645 01/19/13 1320 01/20/13 0600 01/21/13 0640 01/22/13 0543  NA 141  --  141 141 143 144  K 3.3*  --  3.3* 3.4* 3.5 3.7  CL 100  --  101 102 103 102  CO2 30  --  32 31 31 30   GLUCOSE 135*  --  90 115* 105* 106*  BUN 25*  --  27* 27* 22 22  CREATININE 1.62* 1.56* 1.50* 1.43* 1.31 1.32  CALCIUM 9.3  --  8.6 8.7 8.8 8.8  MG  --   --   --  2.0  --   --    CBC:  Recent Labs Lab 01/18/13 0221 01/18/13 0228 01/18/13 0645  WBC 12.8*  --  12.0*  NEUTROABS 10.0*  --   --   HGB 14.1 15.6 13.9  HCT 41.7 46.0 41.6  MCV 93.9  --  93.9  PLT 272  --  232   Cardiac Enzymes:  Recent Labs Lab 01/18/13 0645 01/18/13 1158  01/18/13 1916  TROPONINI <0.30 <0.30 <0.30   BNP (last 3 results)  Recent Labs  01/18/13 0221  PROBNP 6048.0*    Studies: No results found.  Scheduled Meds: . aspirin  81 mg Oral Daily  . carvedilol  6.25 mg Oral BID WC  . docusate sodium  100 mg Oral BID  . doxazosin  4 mg Oral Daily  . furosemide  40 mg Intravenous Q12H  . heparin  5,000 Units Subcutaneous Q8H  . influenza vac split quadrivalent PF  0.5 mL Intramuscular Tomorrow-1000  . isosorbide-hydrALAZINE  2 tablet Oral BID  . Living Better with Heart Failure Book   Does not apply Once  . potassium chloride  40 mEq Oral Daily  . sodium chloride  3 mL Intravenous Q12H  . sodium chloride  3 mL Intravenous Q12H   Continuous Infusions:     CHIU, STEPHEN K  [facility] Pager [phone].  If 8PM-8AM, please contact night-coverage at [email or web address], password [facility] 01/22/2013, 3:05 PM  LOS: 4 days

## 2013-01-22 NOTE — Progress Notes (Signed)
SUBJECTIVE:  He is breathing better but not at baseline.  No acute distress   PHYSICAL EXAM Filed Vitals:   01/21/13 1330 01/21/13 1345 01/21/13 2052 01/22/13 0507  BP: 176/113 162/110 151/90 159/99  Pulse:  79 74 80  Temp:   98 F (36.7 C) 97.9 F (36.6 C)  TempSrc:   Oral Oral  Resp:   18 18  Height:      Weight:      SpO2:   96% 95%   General:  No distress Neck:  JVD to jaw at 45 degrees Lungs:  Clear Heart:  RRR Abdomen:  Positive bowel sounds, no rebound no guarding Extremities:  No edema  LABS:  Results for orders placed during the hospital encounter of 01/18/13 (from the past 24 hour(s))  POCT I-STAT 3, BLOOD GAS (G3+)     Status: Abnormal   Collection Time    01/21/13 10:31 AM      Result Value Range   pH, Arterial 7.359  7.350 - 7.450   pCO2 arterial 50.4 (*) 35.0 - 45.0 mmHg   pO2, Arterial 65.0 (*) 80.0 - 100.0 mmHg   Bicarbonate 28.4 (*) 20.0 - 24.0 mEq/L   TCO2 30  0 - 100 mmol/L   O2 Saturation 91.0     Acid-Base Excess 2.0  0.0 - 2.0 mmol/L   Sample type ARTERIAL    POCT I-STAT 3, BLOOD GAS (G3P V)     Status: Abnormal   Collection Time    01/21/13 11:10 AM      Result Value Range   pH, Ven 7.396 (*) 7.250 - 7.300   pCO2, Ven 50.7 (*) 45.0 - 50.0 mmHg   pO2, Ven 23.0 (*) 30.0 - 45.0 mmHg   Bicarbonate 31.1 (*) 20.0 - 24.0 mEq/L   TCO2 33  0 - 100 mmol/L   O2 Saturation 39.0     Acid-Base Excess 5.0 (*) 0.0 - 2.0 mmol/L   Sample type VENOUS     Comment NOTIFIED PHYSICIAN    POCT I-STAT 3, BLOOD GAS (G3P V)     Status: Abnormal   Collection Time    01/21/13 11:14 AM      Result Value Range   pH, Ven 7.386 (*) 7.250 - 7.300   pCO2, Ven 51.6 (*) 45.0 - 50.0 mmHg   pO2, Ven 21.0 (*) 30.0 - 45.0 mmHg   Bicarbonate 30.9 (*) 20.0 - 24.0 mEq/L   TCO2 32  0 - 100 mmol/L   O2 Saturation 33.0     Acid-Base Excess 5.0 (*) 0.0 - 2.0 mmol/L   Sample type VENOUS     Comment NOTIFIED PHYSICIAN    BASIC METABOLIC PANEL     Status: Abnormal   Collection Time    01/22/13  5:43 AM      Result Value Range   Sodium 144  137 - 147 mEq/L   Potassium 3.7  3.7 - 5.3 mEq/L   Chloride 102  96 - 112 mEq/L   CO2 30  19 - 32 mEq/L   Glucose, Bld 106 (*) 70 - 99 mg/dL   BUN 22  6 - 23 mg/dL   Creatinine, Ser 1.61  0.50 - 1.35 mg/dL   Calcium 8.8  8.4 - 09.6 mg/dL   GFR calc non Af Amer 57 (*) >90 mL/min   GFR calc Af Amer 66 (*) >90 mL/min    Intake/Output Summary (Last 24 hours) at 01/22/13 0454 Last data filed at 01/21/13 0981  Gross per 24 hour  Intake      0 ml  Output   1100 ml  Net  -1100 ml     ASSESSMENT AND PLAN:  ACUTE ON CHRONIC SYSTOLIC AND DIASTOLIC HF:  No CAD.  Elevated right sided pressures.  I will increase the Bidil today.  I am going to reduce and then eventually eliminate the Cardura and try to titrate the beta blocker further.  I will continue IV Lasix today.  Possibly home in the AM.  Ambulate.  Consult dietary for low Na diet.   CKD:  CKD is stable today.    HTN:  He can have his Bidil and Coreg titrated over time as above.   Rollene Rotunda 01/22/2013 8:22 AM

## 2013-01-23 ENCOUNTER — Telehealth: Payer: Self-pay | Admitting: Cardiovascular Disease

## 2013-01-23 LAB — BASIC METABOLIC PANEL
CO2: 32 mEq/L (ref 19–32)
Chloride: 106 mEq/L (ref 96–112)
Creatinine, Ser: 1.23 mg/dL (ref 0.50–1.35)
Glucose, Bld: 106 mg/dL — ABNORMAL HIGH (ref 70–99)
Sodium: 148 mEq/L — ABNORMAL HIGH (ref 137–147)

## 2013-01-23 LAB — CBC
HCT: 37.4 % — ABNORMAL LOW (ref 39.0–52.0)
MCH: 30.8 pg (ref 26.0–34.0)
MCV: 94.4 fL (ref 78.0–100.0)
RBC: 3.96 MIL/uL — ABNORMAL LOW (ref 4.22–5.81)
RDW: 14.6 % (ref 11.5–15.5)
WBC: 10.6 10*3/uL — ABNORMAL HIGH (ref 4.0–10.5)

## 2013-01-23 MED ORDER — CARVEDILOL 6.25 MG PO TABS: 6.2500 mg | ORAL_TABLET | Freq: Two times a day (BID) | ORAL | Status: DC

## 2013-01-23 MED ORDER — FUROSEMIDE 40 MG PO TABS: 40.0000 mg | ORAL_TABLET | Freq: Two times a day (BID) | ORAL | Status: DC

## 2013-01-23 MED ORDER — POTASSIUM CHLORIDE CRYS ER 20 MEQ PO TBCR: 40.0000 meq | EXTENDED_RELEASE_TABLET | Freq: Every day | ORAL | Status: DC

## 2013-01-23 MED ORDER — ISOSORB DINITRATE-HYDRALAZINE 20-37.5 MG PO TABS: 2.0000 | ORAL_TABLET | Freq: Two times a day (BID) | ORAL | Status: DC

## 2013-01-23 MED ORDER — LISINOPRIL 5 MG PO TABS: 5.0000 mg | ORAL_TABLET | Freq: Every day | ORAL | Status: DC

## 2013-01-23 NOTE — Progress Notes (Signed)
Discharge instructions and prescriptions as well as note given to patient.  Left via wheelchair with family member.  Rubye Oaks

## 2013-01-23 NOTE — Discharge Summary (Signed)
Physician Discharge Summary  [name] [name] GEX:[id number] DOB: 20-Oct-1950 DOA: 01/18/2013  PCP: No PCP Per Patient  Admit date: 01/18/2013 Discharge date: 01/23/2013  Time spent: 35 minutes  Recommendations for Outpatient Follow-up:  1. Establish and follow up with PCP in 1-2 weeks 2. Would repeat Basic Metabolic panel in 1-2 weeks 3. Follow up with Cardiology as scheduled  Discharge Diagnoses:  Principal Problem:   CHF (congestive heart failure) Active Problems:   HTN (hypertension)   Asthma   AKI (acute kidney injury)   Discharge Condition: Improved  Diet recommendation: heart healthy  Filed Weights   01/19/13 0500 01/20/13 0500 01/21/13 0500  Weight: 122.018 kg (269 lb) 122.743 kg (270 lb 9.6 oz) 123.651 kg (272 lb 9.6 oz)   History of present illness:  [name] [name] is an 62 y.o. male with hx of longstanding HTN on Lisinopril/HCT combination for many years, hx of asthma, prior CVA, presents to the ER as he has progressive increase shortness of breath, increased DOE, and orthopnea for the past 3 weeks. He denied chest pain, fever, chills or sputum productive coughs. He was seen at the ER in [place], [place], where he used to live in March 2014, and was diagnosed with fluid overload where upon he was given Lasix 40mg  per day. He continued to take this diuretic since then, without any blood work. As far as he can tell, he never had any "kidney problem" before. He also has been taking Moloxicam for his arthritis. He has hip pain and was contemplating hip surgery. Evalaution in the ER tonight showed CXR with cardiomegaly, Cr of 1.8, K of 3.3 mEq/L, and EKG showed nonspecific ST T changes. His BNP was elevated to 6K, and troponin was negative. He was given 80mg  IV lasix, with some improvement of his breathing, and he started to diurese. He was found to be hypertensive with BP 170/130. Hospitalist was asked to admit him for HTN CMP and pulmonary edema.  Hospital Course:  1-acute  resp failure with hypoxia due to acute on chronic systolic heart failure:  -continued daily weights, strict I's and O's and low sodium diet  -started bidil BID (better BP controlled and improved perfusion) and resumed acei  -Cardiology was consulted and was following - s/p cath 01/21/13 with findings of mild coronary plaque with elevated right sided pressures.  2-HTN (hypertension): uncontrolled. Added bidil. Continued BID lasix and coreg. Plan as per cardiology, resumed lisinopril, albeit at lower dose, as renal function later improved  3-Asthma: no wheezing and SOB improved with diuresis.  -continued PRN albuterol  -remained stable 4-AKI (acute kidney injury): unknown of baseline creatinine but suspect underline renal disease base on GFR.   -Cr has improved with diuresis -Lisinopril was resumed per above. 5-BPH: weaned Cardura to off per Cardiology recommendations  Procedures: ECHO: diffuse hypokinesis, EF 35-40%; valvular disease with mild AI, MR and moderate tricuspid regurgitation and pulmonic insufficiency  Heart cath 01/21/13  Consultations:  Cardiology  Discharge Exam: Filed Vitals:   01/22/13 1342 01/22/13 2106 01/23/13 0522 01/23/13 0700  BP: 125/75 131/75 171/98   Pulse: 85 76 78   Temp: 98 F (36.7 C) 98.4 F (36.9 C) 98 F (36.7 C)   TempSrc: Oral     Resp: 17 18 20    Height:      Weight:      SpO2: 95% 95% 98% 94%    General: Awake, in nad Cardiovascular: regular, s1, s2 Respiratory: normal resp effort, no wheezing  Discharge Instructions  Future Appointments Provider Department Dept Phone   02/01/2013 11:30 AM Rosalio Macadamia, NP [name] [place] Office [phone]       Medication List    STOP taking these medications       doxazosin 8 MG tablet  Commonly known as:  CARDURA     lisinopril-hydrochlorothiazide 20-25 MG per tablet  Commonly known as:  PRINZIDE,ZESTORETIC      TAKE these medications       carvedilol 6.25 MG  tablet  Commonly known as:  COREG  Take 1 tablet (6.25 mg total) by mouth 2 (two) times daily with a meal.     furosemide 40 MG tablet  Commonly known as:  LASIX  Take 1 tablet (40 mg total) by mouth 2 (two) times daily.     isosorbide-hydrALAZINE 20-37.5 MG per tablet  Commonly known as:  BIDIL  Take 2 tablets by mouth 2 (two) times daily.     lisinopril 5 MG tablet  Commonly known as:  PRINIVIL,ZESTRIL  Take 1 tablet (5 mg total) by mouth daily.     MAGNESIUM PO  Take 1 tablet by mouth daily. OTC     meloxicam 15 MG tablet  Commonly known as:  MOBIC  Take 15 mg by mouth daily.     potassium chloride SA 20 MEQ tablet  Commonly known as:  K-DUR,KLOR-CON  Take 2 tablets (40 mEq total) by mouth daily.     POTASSIUM PO  Take 1 tablet by mouth daily. OTC       No Known Allergies Follow-up Information   Call establish and follow up with PCP in 1-2 weeks.      Follow up with Norma Fredrickson, NP On 02/01/2013. (11:30 AM - Dr. Harvie Bridge Nurse Practitioner)    Specialty:  Nurse Practitioner   Contact information:   [address] [place] [address] [phone]        The results of significant diagnostics from this hospitalization (including imaging, microbiology, ancillary and laboratory) are listed below for reference.    Significant Diagnostic Studies: Dg Chest Port 1 View  01/18/2013   CLINICAL DATA:  Shortness of breath, history of hypertension, stroke and asthma.  EXAM: PORTABLE CHEST - 1 VIEW  COMPARISON:  None available for comparison at time of study interpretation.  FINDINGS: The cardiac silhouette appears mild to moderately enlarged. Mediastinal silhouette is unremarkable. Linear densities in right lung base. No pleural effusions or focal consolidations. No pneumothorax.  Soft tissue planes and included osseous structures are nonsuspicious. Moderate degenerative change of the right shoulder. Multiple EKG lines overlie the patient and may obscure  subtle underlying pathology.  IMPRESSION: Cardiomegaly ; right lower lobe atelectasis versus scarring.   Electronically Signed   By: Awilda Metro   On: 01/18/2013 02:46    Microbiology: No results found for this or any previous visit (from the past 240 hour(s)).   Labs: Basic Metabolic Panel:  Recent Labs Lab 01/19/13 1320 01/20/13 0600 01/21/13 0640 01/22/13 0543 01/23/13 0455  NA 141 141 143 144 148*  K 3.3* 3.4* 3.5 3.7 4.0  CL 101 102 103 102 106  CO2 32 31 31 30  32  GLUCOSE 90 115* 105* 106* 106*  BUN 27* 27* 22 22 19   CREATININE 1.50* 1.43* 1.31 1.32 1.23  CALCIUM 8.6 8.7 8.8 8.8 9.3  MG  --  2.0  --   --   --    Liver Function Tests: No results found for this basename:  AST, ALT, ALKPHOS, BILITOT, PROT, ALBUMIN,  in the last 168 hours No results found for this basename: LIPASE, AMYLASE,  in the last 168 hours No results found for this basename: AMMONIA,  in the last 168 hours CBC:  Recent Labs Lab 01/18/13 0221 01/18/13 0228 01/18/13 0645 01/23/13 0455  WBC 12.8*  --  12.0* 10.6*  NEUTROABS 10.0*  --   --   --   HGB 14.1 15.6 13.9 12.2*  HCT 41.7 46.0 41.6 37.4*  MCV 93.9  --  93.9 94.4  PLT 272  --  232 253   Cardiac Enzymes:  Recent Labs Lab 01/18/13 0645 01/18/13 1158 01/18/13 1916  TROPONINI <0.30 <0.30 <0.30   BNP: BNP (last 3 results)  Recent Labs  01/18/13 0221  PROBNP 6048.0*   CBG:  Recent Labs Lab 01/20/13 2111  GLUCAP 133*    Signed:  CHIU, STEPHEN K  [facility] 01/23/2013, 11:29 AM

## 2013-01-23 NOTE — Progress Notes (Signed)
    SUBJECTIVE:  He is breathing better but not at baseline.  No acute distress.  I ambulated him in the hallway and his sats stayed in the high 90s.     PHYSICAL EXAM Filed Vitals:   01/22/13 0507 01/22/13 1342 01/22/13 2106 01/23/13 0522  BP: 159/99 125/75 131/75 171/98  Pulse: 80 85 76 78  Temp: 97.9 F (36.6 C) 98 F (36.7 C) 98.4 F (36.9 C) 98 F (36.7 C)  TempSrc: Oral Oral    Resp: 18 17 18 20   Height:      Weight:      SpO2: 95% 95% 95% 98%   General:  No distress Neck:  JVD to jaw at 45 degrees Lungs:  Clear Heart:  RRR Abdomen:  Positive bowel sounds, no rebound no guarding Extremities:  No edema  LABS:  Results for orders placed during the hospital encounter of 01/18/13 (from the past 24 hour(s))  BASIC METABOLIC PANEL     Status: Abnormal   Collection Time    01/23/13  4:55 AM      Result Value Range   Sodium 148 (*) 137 - 147 mEq/L   Potassium 4.0  3.7 - 5.3 mEq/L   Chloride 106  96 - 112 mEq/L   CO2 32  19 - 32 mEq/L   Glucose, Bld 106 (*) 70 - 99 mg/dL   BUN 19  6 - 23 mg/dL   Creatinine, Ser 1.61  0.50 - 1.35 mg/dL   Calcium 9.3  8.4 - 09.6 mg/dL   GFR calc non Af Amer 62 (*) >90 mL/min   GFR calc Af Amer 72 (*) >90 mL/min  CBC     Status: Abnormal   Collection Time    01/23/13  4:55 AM      Result Value Range   WBC 10.6 (*) 4.0 - 10.5 K/uL   RBC 3.96 (*) 4.22 - 5.81 MIL/uL   Hemoglobin 12.2 (*) 13.0 - 17.0 g/dL   HCT 04.5 (*) 40.9 - 81.1 %   MCV 94.4  78.0 - 100.0 fL   MCH 30.8  26.0 - 34.0 pg   MCHC 32.6  30.0 - 36.0 g/dL   RDW 91.4  78.2 - 95.6 %   Platelets 253  150 - 400 K/uL   No intake or output data in the 24 hours ending 01/23/13 0813   ASSESSMENT AND PLAN:  ACUTE ON CHRONIC SYSTOLIC AND DIASTOLIC HF:  No CAD.  Elevated right sided pressures.  I increased the Bidil yesterday.  I reduced the Cardura and I will discontinue this today.  Increase the beta blocker.  OK to transfer home with Lasix 40 mg bid.  Needs transition of care  follow up next week.    CKD:  CKD is stable today.    HTN:  This is being managed in the context of treating his CHF  Rollene Rotunda 01/23/2013 8:13 AM

## 2013-01-23 NOTE — Telephone Encounter (Signed)
New problem    7 days tcm with lori g appt on 1/9 @ 11:30 per chris b pa

## 2013-01-25 NOTE — Telephone Encounter (Signed)
Pt was called for TCM post hospital DC. Pt aware of date and time of OV, He has all of his meds/ reviewed 2 new meds carvedilol and bidil. Pt states recommendations as stated below / he has a sore ankle and will follow Dr Lavonia Drafts recommendation. Pt has an app with new establish pcp on Monday per pt.

## 2013-01-25 NOTE — Telephone Encounter (Signed)
I was called and the patient regarding painful ankle. Patient reported pain 9/10 in severity. Very tender to touch. Patient denies previous symptoms he does before. Denied chills or fevers, nausea vomiting. Patient was wondering if lites he'll could be the cause of that his symptoms.  Given recent heart failure admission, IV diuresis and description of symptoms the patient likely having a flare of gout or pseudogout. Recommended ibuprofen 800 mg now and one more dose in the morning. Given recent kidney dysfunction and heart failure exacerbation the patient was instructed to call his primary care physician or go to the urgent care to get his ankle and evaluated. He was instructed in a not to take more than 2 doses of NSAIDs without further evaluation. he will likely needs colchicine prescription.

## 2013-02-01 ENCOUNTER — Encounter: Payer: Self-pay | Admitting: Nurse Practitioner

## 2013-02-01 ENCOUNTER — Ambulatory Visit (INDEPENDENT_AMBULATORY_CARE_PROVIDER_SITE_OTHER): Payer: Medicaid Other | Admitting: Nurse Practitioner

## 2013-02-01 VITALS — BP 160/120 | HR 80 | Ht 76.0 in | Wt 271.0 lb

## 2013-02-01 DIAGNOSIS — I10 Essential (primary) hypertension: Secondary | ICD-10-CM

## 2013-02-01 DIAGNOSIS — R0609 Other forms of dyspnea: Secondary | ICD-10-CM

## 2013-02-01 DIAGNOSIS — I5022 Chronic systolic (congestive) heart failure: Secondary | ICD-10-CM

## 2013-02-01 DIAGNOSIS — R0989 Other specified symptoms and signs involving the circulatory and respiratory systems: Secondary | ICD-10-CM

## 2013-02-01 DIAGNOSIS — R0600 Dyspnea, unspecified: Secondary | ICD-10-CM

## 2013-02-01 LAB — BASIC METABOLIC PANEL
BUN: 15 mg/dL (ref 6–23)
CO2: 29 mEq/L (ref 19–32)
Calcium: 9.4 mg/dL (ref 8.4–10.5)
Chloride: 105 mEq/L (ref 96–112)
Creatinine, Ser: 1.3 mg/dL (ref 0.4–1.5)
GFR: 72.83 mL/min (ref >60.00)
Glucose, Bld: 105 mg/dL — ABNORMAL HIGH (ref 70–99)
Potassium: 3.6 mEq/L (ref 3.5–5.1)
Sodium: 141 mEq/L (ref 135–145)

## 2013-02-01 LAB — BRAIN NATRIURETIC PEPTIDE: Pro B Natriuretic peptide (BNP): 723 pg/mL — ABNORMAL HIGH (ref 0.0–100.0)

## 2013-02-01 MED ORDER — POTASSIUM CHLORIDE CRYS ER 20 MEQ PO TBCR: 20.0000 meq | EXTENDED_RELEASE_TABLET | Freq: Every day | ORAL | Status: DC

## 2013-02-01 NOTE — Progress Notes (Signed)
[name] [name] Date of Birth: [birth year] Medical Record #[id number]  History of Present Illness: Mr. Desa is seen back today for a post hospital/TOC visit. Seen for Dr. Acie Fredrickson. He has had longstanding HTN, asthma, and prior CVA. Originally from the [place].   Most recently presented with increased SOB, DOE and orthopnea for 3 weeks - he was found to be hypertensive, using NSAID, etc. Had been taking some Lasix that he was prescribed while living in [place]. He was cathed - had mild CAD with elevated right sided pressures. Bidil added along with ACE. Renal function improved. EF was 35 to 40%.   Comes back today. Here alone. Medicines are all mixed up - on both OTC potassium and prescription and only taking the OTC agent. He is not taking Mobic (which was left on his discharge meds). Has not had his medicines today. His car had to be towed and so he did not eat and thus did not take his medicines. He notes that his breathing is much better. Weight is stable at home. His swelling has improved but not totally resolved. Some ankle pain. Has chronic hip pain and wanted to have THR in the future. No chest pain. Avoiding salt. Does not drink alcohol. Trying to get on Medicaid. Works part time in [place] and needs to return to work so he can buy his medicines. Some headache and he thought it was due to his medicine. He does not have a primary care doctor.   Current Outpatient Prescriptions  Medication Sig Dispense Refill  . carvedilol (COREG) 6.25 MG tablet Take 1 tablet (6.25 mg total) by mouth 2 (two) times daily with a meal.  60 tablet  0  . furosemide (LASIX) 40 MG tablet Take 1 tablet (40 mg total) by mouth 2 (two) times daily.  60 tablet  0  . isosorbide-hydrALAZINE (BIDIL) 20-37.5 MG per tablet Take 2 tablets by mouth 2 (two) times daily.  60 tablet  0  . lisinopril (PRINIVIL,ZESTRIL) 5 MG tablet Take 1 tablet (5 mg total) by mouth daily.  30 tablet  0  . MAGNESIUM PO Take 1 tablet by mouth  daily. OTC      . POTASSIUM PO Take 1 tablet by mouth daily. OTC      . meloxicam (MOBIC) 15 MG tablet Take 15 mg by mouth daily.      . potassium chloride SA (K-DUR,KLOR-CON) 20 MEQ tablet Take 2 tablets (40 mEq total) by mouth daily.  30 tablet  0   No current facility-administered medications for this visit.    No Known Allergies  Past Medical History  Diagnosis Date  . Hypertension   . CHF (congestive heart failure)     "just today" (01/18/2013)  . Asthma     'as a child"   . Shortness of breath     "comes on at anytime" (01/18/2013)  . Sickle cell trait   . Stroke 12/1988  . Arthritis     "left hip; right knee" (01/18/2013)    Past Surgical History  Procedure Laterality Date  . 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 Hernia repair  2004?    "above the navel" (01/18/2013)  . Cardiac catheterization  12/1988    History  Smoking status  . Never Smoker   Smokeless tobacco  . Never Used    Comment: 01/18/2013 "quit smoking weed ~ 2005"    History  Alcohol Use  . Yes    Comment:  01/18/2013 "might have part of a drink on special occasions"    History reviewed. No pertinent family history.  Review of Systems: The review of systems is per the HPI.  All other systems were reviewed and are negative.  Physical Exam: BP 160/120  Pulse 80  Ht 6\' 4"  (1.93 m)  Wt 271 lb (122.925 kg)  BMI 33.00 kg/m2  SpO2 96% He has had no medicines today.  Patient is very pleasant and in no acute distress. He is of large stature. Skin is warm and dry. Color is normal.  HEENT is unremarkable. Normocephalic/atraumatic. PERRL. Sclera are nonicteric. Neck is supple. No masses. No JVD. Lungs are clear. Cardiac exam shows a regular rate and rhythm. Rate a little faster. +S4.  Abdomen is soft. Extremities are with 1+ edema. Gait and ROM are intact. No gross neurologic deficits noted.  Wt Readings from Last 3 Encounters:  02/01/13 271 lb (122.925 kg)  01/21/13 272 lb 9.6 oz  (123.651 kg)  01/21/13 272 lb 9.6 oz (123.651 kg)     LABORATORY DATA: BMET and BNP are pending  Lab Results  Component Value Date   WBC 10.6* 01/23/2013   HGB 12.2* 01/23/2013   HCT 37.4* 01/23/2013   PLT 253 01/23/2013   GLUCOSE 106* 01/23/2013   NA 148* 01/23/2013   K 4.0 01/23/2013   CL 106 01/23/2013   CREATININE 1.23 01/23/2013   BUN 19 01/23/2013   CO2 32 01/23/2013   TSH 1.124 01/18/2013   INR 1.12 01/21/2013   Echo Study Conclusions from December 2014  - Left ventricle: The cavity size was moderately dilated. Wall thickness was increased in a pattern of moderate LVH. Systolic function was moderately reduced. The estimated ejection fraction was in the range of 35% to 40%. Diffuse hypokinesis. Regional wall motion abnormalities cannot be excluded. The study is not technically sufficient to allow evaluation of LV diastolic function. - Aortic valve: Mild regurgitation. - Mitral valve: Mild regurgitation. - Left atrium: The atrium was mildly dilated. - Right ventricle: The cavity size was mildly dilated. - Right atrium: The atrium was mildly dilated. - Tricuspid valve: Moderate regurgitation. - Pulmonic valve: Moderate regurgitation. - Pulmonary arteries: PA peak pressure: 33mm Hg (S).  Coronary angiography:  Coronary dominance: right  Left mainstem: Long and normal.  Left anterior descending (LAD): Normal. D1 large and normal.  Left circumflex (LCx): AV groove normal. OM large and normal.  Right coronary artery (RCA): Very large. Mild diffuse luminal irregularities. Long mid 40% stenosis. AM very large with PDA. Mild diffuse luminal irregularities. PL large nd normal  Left ventriculography: Left ventricle was crossed for pressures but I did not do an LV gram secondary to elevated end diastolic pressure and CKD  Final Conclusions: Mild coronary plaque. Elevated right sided pressures.  Recommendations: Elevated right sided pressures appear to be related in part to  systolic and diastolic heart failure with elevated LVEDP. However, the elevated right sided pressures were somewhat out of proportion to the EDP elevation. If he does not improve with usual treatment and lifestyle changes for acute on chronic systolic and diastolic HF I would consider pulmonary vasodilator therapy.  Jeneen Rinks Hochrein  01/21/2013, 11:08 AM   Assessment / Plan: 1. Systolic HF - EF of [id number] - probably from long standing HTN - no medicines taken today. I am increasing his Coreg to 12.5 mg BID. See him back in 2 weeks. Hope to increase his ACE on return. Consider aldactone if needed as well. Check follow  up labs today. Reviewed the need for salt restriction, daily weights, etc. He seems quite motivated.   2. HTN - Coreg is increased today.   3. CKD - rechecking labs today  Will try to arrange primary care follow up. He may return to work.   Patient is agreeable to this plan and will call if any problems develop in the interim.   Burtis Junes, RN, [facility] [address] [place], [place]  [id number] [phone]

## 2013-02-01 NOTE — Patient Instructions (Addendum)
We will check labs today  Stay on your current medicines but we are increasing your Coreg to 12.5 mg two times a day - you can take 2 of your 6.25 mg tablets two times a day to equal this dose - I have sent the prescription for the 12.5 mg tablet to the drug store  Stop your over the counter potassium  Take the prescription potassium but just one pill a day - 20 meq  Stay off the Mobic and all other NSAIDs  I will see you in 2 weeks - our goal is to increase your medicines so that the pumping function of your heart gets better  Avoid salt  Keep weighing every day  We will try to get you to a primary care doctor  Call the [place] office at [phone] if you have any questions, problems or concerns.

## 2013-02-07 ENCOUNTER — Telehealth: Payer: Self-pay | Admitting: Nurse Practitioner

## 2013-02-07 NOTE — Telephone Encounter (Signed)
New message   Patient is very concerned about the swelling in his legs, please call and advise.

## 2013-02-08 NOTE — Telephone Encounter (Signed)
Follow up     Called yesterday regarding legs swelling.  Waiting on a nurse to return his call

## 2013-02-08 NOTE — Telephone Encounter (Signed)
S/w pt is agreeable to plan to increase Lasix ( 60 mg) bid for three days and ( 20 meq) potassium for three days. Weight is not up and is wearing support hose. Stated out of Bidil, [name] stated that is probably why swelling, Pt stated could not afford so I left pt samples up front pt will pick up today pt agreeable to plan and will call us if any more concerns after the 3 days of increasing medication also has ov appt next week

## 2013-02-08 NOTE — Telephone Encounter (Signed)
?  weight up? Ok to increase his Lasix to 60 mg BID for the next 3 days with an extra potassium 20 meq x 3 days Really restrict salt, knee high support stockings, etc.  See back as planned unless no response.

## 2013-02-12 ENCOUNTER — Telehealth: Payer: Self-pay | Admitting: Nurse Practitioner

## 2013-02-12 ENCOUNTER — Other Ambulatory Visit: Payer: Self-pay | Admitting: *Deleted

## 2013-02-12 MED ORDER — CARVEDILOL 6.25 MG PO TABS: 6.2500 mg | ORAL_TABLET | Freq: Two times a day (BID) | ORAL | Status: DC

## 2013-02-12 NOTE — Telephone Encounter (Signed)
Pt received a call from our office today didn't know what it was about so called me, it was to confirm appointment on Friday, Pt also stated needed carvedilol filled sent into pharmacy, [facility],  confirmed with  Patient

## 2013-02-12 NOTE — Telephone Encounter (Signed)
New Problem:  Pt has questions for Danielle. Pt would like a call back.

## 2013-02-15 ENCOUNTER — Ambulatory Visit (INDEPENDENT_AMBULATORY_CARE_PROVIDER_SITE_OTHER): Payer: Medicaid Other | Admitting: Nurse Practitioner

## 2013-02-15 ENCOUNTER — Encounter: Payer: Self-pay | Admitting: Nurse Practitioner

## 2013-02-15 VITALS — BP 138/80 | HR 80 | Ht 76.0 in | Wt 274.1 lb

## 2013-02-15 DIAGNOSIS — R0609 Other forms of dyspnea: Secondary | ICD-10-CM

## 2013-02-15 DIAGNOSIS — R0600 Dyspnea, unspecified: Secondary | ICD-10-CM

## 2013-02-15 DIAGNOSIS — M25572 Pain in left ankle and joints of left foot: Secondary | ICD-10-CM

## 2013-02-15 DIAGNOSIS — I5022 Chronic systolic (congestive) heart failure: Secondary | ICD-10-CM

## 2013-02-15 DIAGNOSIS — M25579 Pain in unspecified ankle and joints of unspecified foot: Secondary | ICD-10-CM

## 2013-02-15 DIAGNOSIS — R0989 Other specified symptoms and signs involving the circulatory and respiratory systems: Secondary | ICD-10-CM

## 2013-02-15 LAB — BASIC METABOLIC PANEL
BUN: 19 mg/dL (ref 6–23)
CO2: 32 mEq/L (ref 19–32)
Calcium: 8.9 mg/dL (ref 8.4–10.5)
Chloride: 102 mEq/L (ref 96–112)
Creatinine, Ser: 1.3 mg/dL (ref 0.4–1.5)
GFR: 70.91 mL/min (ref >60.00)
Glucose, Bld: 105 mg/dL — ABNORMAL HIGH (ref 70–99)
Potassium: 3.7 mEq/L (ref 3.5–5.1)
Sodium: 139 mEq/L (ref 135–145)

## 2013-02-15 LAB — BRAIN NATRIURETIC PEPTIDE: Pro B Natriuretic peptide (BNP): 603 pg/mL — ABNORMAL HIGH (ref 0.0–100.0)

## 2013-02-15 LAB — URIC ACID: Uric Acid, Serum: 7.6 mg/dL (ref 4.0–7.8)

## 2013-02-15 MED ORDER — LISINOPRIL 10 MG PO TABS: 10.0000 mg | ORAL_TABLET | Freq: Every day | ORAL | Status: DC

## 2013-02-15 NOTE — Progress Notes (Signed)
[name] [name] Date of Birth: 1950-09-05 Medical Record #[id number]  History of Present Illness: [name] [name] is seen back today for a 2 week visit. Seen for Dr. Acie Fredrickson. He has had longstanding HTN, asthma, and prior CVA. Originally from the [place].   Most recently presented with increased SOB, DOE and orthopnea for 3 weeks - he was found to be hypertensive, using NSAID, etc. Had been taking some Lasix that he was prescribed while living in [place]. He was cathed - had mild CAD with elevated right sided pressures. Bidil added along with ACE. Renal function improved. EF was 35 to 40%.   Seen 2 weeks ago - medicines were all mixed up - on both OTC potassium and prescription and only taking the OTC agent. He was not taking Mobic (which was left on his discharge meds). Had not had his medicines today. Breathing was much better. Weight was stable at home. His swelling had improved. Trying to get on Medicaid. Works part time in [place] and needed to return to work so he could buy his medicines. Probably will not be able to afford his Bidil. No primary care doctor.   Called since his last visit - had to increase his Lasix for a couple of days.   Comes back today. Here alone. Has DOE - probably NYHA stage II/III. No cough. Weight averaging 268 at home. Trying to avoid salt. Trying to work. Already turned down for disability several months ago. Has not heard about his Medicaid yet. Has PCP arranged for next month. Still with left ankle joint pain/swelling. No chest pain. Little confused about his medicines. He is not going to be able to afford any brand drug.   Current Outpatient Prescriptions  Medication Sig Dispense Refill  . carvedilol (COREG) 6.25 MG tablet Take 1 tablet (6.25 mg total) by mouth 2 (two) times daily with a meal.  60 tablet  1  . furosemide (LASIX) 40 MG tablet Take 1 tablet (40 mg total) by mouth 2 (two) times daily.  60 tablet  0  . isosorbide-hydrALAZINE (BIDIL) 20-37.5 MG per  tablet Take 2 tablets by mouth 2 (two) times daily.  60 tablet  0  . lisinopril (PRINIVIL,ZESTRIL) 5 MG tablet Take 1 tablet (5 mg total) by mouth daily.  30 tablet  0  . MAGNESIUM PO Take 1 tablet by mouth daily. OTC      . potassium chloride SA (K-DUR,KLOR-CON) 20 MEQ tablet Take 1 tablet (20 mEq total) by mouth daily.  30 tablet  0   No current facility-administered medications for this visit.    No Known Allergies  Past Medical History  Diagnosis Date  . Hypertension   . CHF (congestive heart failure)     "just today" (01/18/2013)  . Asthma     'as a child"   . Shortness of breath     "comes on at anytime" (01/18/2013)  . Sickle cell trait   . Stroke 12/1988  . Arthritis     "left hip; right knee" (01/18/2013)    Past Surgical History  Procedure Laterality Date  . 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 Hernia repair  2004?    "above the navel" (01/18/2013)  . Cardiac catheterization  12/1988    History  Smoking status  . Never Smoker   Smokeless tobacco  . Never Used    Comment: 01/18/2013 "quit smoking weed ~ 2005"    History  Alcohol Use  . Yes  Comment: 01/18/2013 "might have part of a drink on special occasions"    Family History  Problem Relation Age of Onset  . Heart attack Mother   . Hypertension Mother     Review of Systems: The review of systems is per the HPI.  All other systems were reviewed and are negative.  Physical Exam: BP 138/80  Pulse 80  Ht 6\' 4"  (1.93 m)  Wt 274 lb 1.9 oz (124.34 kg)  BMI 33.38 kg/m2 Patient is very pleasant and in no acute distress. Skin is warm and dry. Color is normal.  HEENT is unremarkable. Normocephalic/atraumatic. PERRL. Sclera are nonicteric. Neck is supple. No masses. No JVD. Lungs are clear. Cardiac exam shows a regular rate and rhythm. Occasional ectopic noted. Abdomen is obese but soft. +HJR noted. Extremities are with trace edema - left greater than right. Gait and ROM are intact. No gross  neurologic deficits noted.  Wt Readings from Last 3 Encounters:  02/15/13 274 lb 1.9 oz (124.34 kg)  02/01/13 271 lb (122.925 kg)  01/21/13 272 lb 9.6 oz (123.651 kg)     LABORATORY DATA: PENDING  Lab Results  Component Value Date   WBC 10.6* 01/23/2013   HGB 12.2* 01/23/2013   HCT 37.4* 01/23/2013   PLT 253 01/23/2013   GLUCOSE 105* 02/01/2013   NA 141 02/01/2013   K 3.6 02/01/2013   CL 105 02/01/2013   CREATININE 1.3 02/01/2013   BUN 15 02/01/2013   CO2 29 02/01/2013   TSH 1.124 01/18/2013   INR 1.12 01/21/2013   Echo Study Conclusions from December 2014  - Left ventricle: The cavity size was moderately dilated. Wall thickness was increased in a pattern of moderate LVH. Systolic function was moderately reduced. The estimated ejection fraction was in the range of 35% to 40%. Diffuse hypokinesis. Regional wall motion abnormalities cannot be excluded. The study is not technically sufficient to allow evaluation of LV diastolic function. - Aortic valve: Mild regurgitation. - Mitral valve: Mild regurgitation. - Left atrium: The atrium was mildly dilated. - Right ventricle: The cavity size was mildly dilated. - Right atrium: The atrium was mildly dilated. - Tricuspid valve: Moderate regurgitation. - Pulmonic valve: Moderate regurgitation. - Pulmonary arteries: PA peak pressure: 97mm Hg (S).  Coronary angiography:  Coronary dominance: right  Left mainstem: Long and normal.  Left anterior descending (LAD): Normal. D1 large and normal.  Left circumflex (LCx): AV groove normal. OM large and normal.  Right coronary artery (RCA): Very large. Mild diffuse luminal irregularities. Long mid 40% stenosis. AM very large with PDA. Mild diffuse luminal irregularities. PL large nd normal  Left ventriculography: Left ventricle was crossed for pressures but I did not do an LV gram secondary to elevated end diastolic pressure and CKD  Final Conclusions: Mild coronary plaque. Elevated right sided  pressures.  Recommendations: Elevated right sided pressures appear to be related in part to systolic and diastolic heart failure with elevated LVEDP. However, the elevated right sided pressures were somewhat out of proportion to the EDP elevation. If he does not improve with usual treatment and lifestyle changes for acute on chronic systolic and diastolic HF I would consider pulmonary vasodilator therapy.  Jeneen Rinks Hochrein  01/21/2013, 11:08 AM     Assessment / Plan:  1. Systolic HF - EF of 08% - probably from long standing HTN - he is not going to be able to afford any brand name drug. Will stop the Bidil and focus on his other medicines instead. Increase the  Lisinopril today to 10 mg. Still looks to be stage II/III. See back in 3 weeks with Dr. Acie Fredrickson. Check BMET today as well. Continue with daily weights/salt restriction etc. Consider aldactone in the future. Will plan for repeat echo in a couple of months to reassess his EF.   2. HTN - BP ok - still have room to titrate his medicines  3. CKD   4. Left ankle pain - will check a uric acid - I wonder if he has gout.   See him back in 3 weeks. Check lab today.   Patient is agreeable to this plan and will call if any problems develop in the interim.   Burtis Junes, RN, [facility]  [address] [place]  [place], [place] [address]  [phone]

## 2013-02-15 NOTE — Patient Instructions (Addendum)
Verify your dose of Coreg - should be 12.5 mg two times a day. You should either have a 12.5 mg prescription or taking 2 of your 6.25 mg tablets - two times a day - Let me know if this is not the dose on your bottle  Increase the Lisinopril to 10 mg a day - this will be 2 of your 5 mg tablets once a day - the prescription for the 10 mg is at the drug store.   Stop the Bidil - I am going to work on increasing your other medicines in its place  We will check labs today  Keep weighing yourself each morning and record.  Take extra dose of diuretic (Furosemide) for weight gain of 3 pounds in 24 hours.   Limit sodium intake. Goal is to have less than 2000 mg (2gm) of salt per day.  See me in 2 to 3 weeks on a day that Dr. Acie Fredrickson is here as well  Call the [place] office at [phone] if you have any questions, problems or concerns.

## 2013-02-18 ENCOUNTER — Telehealth: Payer: Self-pay | Admitting: Nurse Practitioner

## 2013-02-18 NOTE — Telephone Encounter (Signed)
Called pt with his lab results and to continue current medications.  He states his ankle is more swollen and painful now than before.  Suggested he see his PCP for evaluation.  He has an appointment but it not until 2/16.  He sees Lori again 2/11.

## 2013-02-18 NOTE — Telephone Encounter (Signed)
Called pt and informed him to go to urgent care for his ankle pain and swelling.  Verbalizes understanding.

## 2013-02-18 NOTE — Telephone Encounter (Signed)
New message         Pt wants lab results and wanted to let lori know that his left ankle had swelled up on Saturday. Not swelled now.

## 2013-02-18 NOTE — Telephone Encounter (Signed)
He should go to the Urgent Care.

## 2013-02-19 ENCOUNTER — Other Ambulatory Visit: Payer: Self-pay | Admitting: *Deleted

## 2013-02-19 MED ORDER — FUROSEMIDE 40 MG PO TABS: 40.0000 mg | ORAL_TABLET | Freq: Two times a day (BID) | ORAL | Status: DC

## 2013-02-28 ENCOUNTER — Encounter (HOSPITAL_COMMUNITY): Payer: Self-pay | Admitting: Emergency Medicine

## 2013-02-28 ENCOUNTER — Telehealth: Payer: Self-pay | Admitting: Nurse Practitioner

## 2013-02-28 ENCOUNTER — Emergency Department (HOSPITAL_COMMUNITY): Payer: Medicaid Other

## 2013-02-28 ENCOUNTER — Inpatient Hospital Stay (HOSPITAL_COMMUNITY)
Admission: EM | Admit: 2013-02-28 | Discharge: 2013-03-06 | DRG: 292 | Disposition: A | Discharge status: Home / Self Care | Payer: Medicaid Other | Attending: Internal Medicine | Admitting: Internal Medicine

## 2013-02-28 DIAGNOSIS — Z79899 Other long term (current) drug therapy: Secondary | ICD-10-CM

## 2013-02-28 DIAGNOSIS — Z8673 Personal history of transient ischemic attack (TIA), and cerebral infarction without residual deficits: Secondary | ICD-10-CM

## 2013-02-28 DIAGNOSIS — I472 Ventricular tachycardia, unspecified: Secondary | ICD-10-CM | POA: Diagnosis not present

## 2013-02-28 DIAGNOSIS — I509 Heart failure, unspecified: Secondary | ICD-10-CM

## 2013-02-28 DIAGNOSIS — I4729 Other ventricular tachycardia: Secondary | ICD-10-CM | POA: Diagnosis not present

## 2013-02-28 DIAGNOSIS — D573 Sickle-cell trait: Secondary | ICD-10-CM | POA: Diagnosis present

## 2013-02-28 DIAGNOSIS — I130 Hypertensive heart and chronic kidney disease with heart failure and stage 1 through stage 4 chronic kidney disease, or unspecified chronic kidney disease: Secondary | ICD-10-CM | POA: Diagnosis present

## 2013-02-28 DIAGNOSIS — I5023 Acute on chronic systolic (congestive) heart failure: Principal | ICD-10-CM | POA: Diagnosis present

## 2013-02-28 DIAGNOSIS — M1710 Unilateral primary osteoarthritis, unspecified knee: Secondary | ICD-10-CM | POA: Diagnosis present

## 2013-02-28 DIAGNOSIS — I428 Other cardiomyopathies: Secondary | ICD-10-CM | POA: Diagnosis present

## 2013-02-28 DIAGNOSIS — R0989 Other specified symptoms and signs involving the circulatory and respiratory systems: Secondary | ICD-10-CM

## 2013-02-28 DIAGNOSIS — M109 Gout, unspecified: Secondary | ICD-10-CM | POA: Diagnosis present

## 2013-02-28 DIAGNOSIS — N400 Enlarged prostate without lower urinary tract symptoms: Secondary | ICD-10-CM | POA: Diagnosis present

## 2013-02-28 DIAGNOSIS — M25579 Pain in unspecified ankle and joints of unspecified foot: Secondary | ICD-10-CM

## 2013-02-28 DIAGNOSIS — Z8249 Family history of ischemic heart disease and other diseases of the circulatory system: Secondary | ICD-10-CM

## 2013-02-28 DIAGNOSIS — N182 Chronic kidney disease, stage 2 (mild): Secondary | ICD-10-CM | POA: Diagnosis present

## 2013-02-28 DIAGNOSIS — I129 Hypertensive chronic kidney disease with stage 1 through stage 4 chronic kidney disease, or unspecified chronic kidney disease: Secondary | ICD-10-CM | POA: Diagnosis present

## 2013-02-28 DIAGNOSIS — M25471 Effusion, right ankle: Secondary | ICD-10-CM

## 2013-02-28 DIAGNOSIS — I5020 Unspecified systolic (congestive) heart failure: Secondary | ICD-10-CM

## 2013-02-28 DIAGNOSIS — R635 Abnormal weight gain: Secondary | ICD-10-CM | POA: Diagnosis present

## 2013-02-28 DIAGNOSIS — I10 Essential (primary) hypertension: Secondary | ICD-10-CM

## 2013-02-28 DIAGNOSIS — I110 Hypertensive heart disease with heart failure: Secondary | ICD-10-CM | POA: Diagnosis present

## 2013-02-28 DIAGNOSIS — M1610 Unilateral primary osteoarthritis, unspecified hip: Secondary | ICD-10-CM | POA: Diagnosis present

## 2013-02-28 DIAGNOSIS — R0609 Other forms of dyspnea: Secondary | ICD-10-CM

## 2013-02-28 DIAGNOSIS — IMO0002 Reserved for concepts with insufficient information to code with codable children: Secondary | ICD-10-CM

## 2013-02-28 LAB — BASIC METABOLIC PANEL
BUN: 23 mg/dL (ref 6–23)
CO2: 29 mEq/L (ref 19–32)
CREATININE: 1.34 mg/dL (ref 0.50–1.35)
Calcium: 9.1 mg/dL (ref 8.4–10.5)
Chloride: 103 mEq/L (ref 96–112)
GFR calc Af Amer: 64 mL/min — ABNORMAL LOW (ref >90)
GFR calc non Af Amer: 56 mL/min — ABNORMAL LOW (ref >90)
Glucose, Bld: 109 mg/dL — ABNORMAL HIGH (ref 70–99)
POTASSIUM: 4.2 meq/L (ref 3.7–5.3)
SODIUM: 142 meq/L (ref 137–147)

## 2013-02-28 LAB — URINALYSIS, ROUTINE W REFLEX MICROSCOPIC
BILIRUBIN URINE: NEGATIVE
Glucose, UA: NEGATIVE mg/dL
HGB URINE DIPSTICK: NEGATIVE
Ketones, ur: NEGATIVE mg/dL
Leukocytes, UA: NEGATIVE
Nitrite: NEGATIVE
PH: 7 (ref 5.0–8.0)
Protein, ur: NEGATIVE mg/dL
Specific Gravity, Urine: 1.009 (ref 1.005–1.030)
Urobilinogen, UA: 0.2 mg/dL (ref 0.0–1.0)

## 2013-02-28 LAB — CBC
HEMATOCRIT: 36.9 % — AB (ref 39.0–52.0)
Hemoglobin: 12.7 g/dL — ABNORMAL LOW (ref 13.0–17.0)
MCH: 30.2 pg (ref 26.0–34.0)
MCHC: 34.4 g/dL (ref 30.0–36.0)
MCV: 87.6 fL (ref 78.0–100.0)
PLATELETS: 232 10*3/uL (ref 150–400)
RBC: 4.21 MIL/uL — ABNORMAL LOW (ref 4.22–5.81)
RDW: 14.8 % (ref 11.5–15.5)
WBC: 8.9 10*3/uL (ref 4.0–10.5)

## 2013-02-28 LAB — PRO B NATRIURETIC PEPTIDE: PRO B NATRI PEPTIDE: 6365 pg/mL — AB (ref 0–125)

## 2013-02-28 LAB — POCT I-STAT TROPONIN I: Troponin i, poc: 0.01 ng/mL (ref 0.00–0.08)

## 2013-02-28 LAB — TROPONIN I: Troponin I: <0.3 ng/mL (ref <0.30)

## 2013-02-28 LAB — MAGNESIUM: Magnesium: 2.2 mg/dL (ref 1.5–2.5)

## 2013-02-28 MED ORDER — CLONIDINE HCL 0.1 MG PO TABS
0.1000 mg | ORAL_TABLET | Freq: Every day | ORAL | Status: DC
  Filled 2013-02-28: qty 1

## 2013-02-28 MED ORDER — FUROSEMIDE 10 MG/ML IJ SOLN
80.0000 mg | Freq: Once | INTRAMUSCULAR | Status: AC
  Administered 2013-02-28: 80 mg via INTRAVENOUS
  Filled 2013-02-28: qty 8

## 2013-02-28 MED ORDER — POTASSIUM CHLORIDE CRYS ER 20 MEQ PO TBCR
20.0000 meq | EXTENDED_RELEASE_TABLET | Freq: Every day | ORAL | Status: DC
  Administered 2013-03-01 – 2013-03-06 (×6): 20 meq via ORAL
  Filled 2013-02-28 (×7): qty 1

## 2013-02-28 MED ORDER — FUROSEMIDE 10 MG/ML IJ SOLN: 60.0000 mg | Freq: Three times a day (TID) | INTRAMUSCULAR | Status: DC

## 2013-02-28 MED ORDER — FUROSEMIDE 10 MG/ML IJ SOLN
60.0000 mg | Freq: Three times a day (TID) | INTRAMUSCULAR | Status: DC
  Administered 2013-02-28 – 2013-03-02 (×8): 60 mg via INTRAVENOUS
  Filled 2013-02-28 (×10): qty 6

## 2013-02-28 MED ORDER — POTASSIUM CHLORIDE CRYS ER 20 MEQ PO TBCR
40.0000 meq | EXTENDED_RELEASE_TABLET | Freq: Once | ORAL | Status: AC
  Administered 2013-02-28: 40 meq via ORAL
  Filled 2013-02-28: qty 2

## 2013-02-28 MED ORDER — HEPARIN SODIUM (PORCINE) 5000 UNIT/ML IJ SOLN
5000.0000 [IU] | Freq: Three times a day (TID) | INTRAMUSCULAR | Status: DC
  Administered 2013-02-28 – 2013-03-06 (×16): 5000 [IU] via SUBCUTANEOUS
  Filled 2013-02-28 (×20): qty 1

## 2013-02-28 MED ORDER — CLONIDINE HCL 0.1 MG PO TABS
0.1000 mg | ORAL_TABLET | Freq: Once | ORAL | Status: AC
  Administered 2013-02-28: 0.1 mg via ORAL
  Filled 2013-02-28: qty 1

## 2013-02-28 MED ORDER — INFLUENZA VAC SPLIT QUAD 0.5 ML IM SUSP
0.5000 mL | INTRAMUSCULAR | Status: AC
  Administered 2013-03-01: 0.5 mL via INTRAMUSCULAR
  Filled 2013-02-28: qty 0.5

## 2013-02-28 MED ORDER — LISINOPRIL 20 MG PO TABS
20.0000 mg | ORAL_TABLET | Freq: Every day | ORAL | Status: DC
  Administered 2013-02-28 – 2013-03-06 (×7): 20 mg via ORAL
  Filled 2013-02-28 (×8): qty 1

## 2013-02-28 MED ORDER — CARVEDILOL 12.5 MG PO TABS
12.5000 mg | ORAL_TABLET | Freq: Two times a day (BID) | ORAL | Status: DC
  Administered 2013-03-01 – 2013-03-03 (×5): 12.5 mg via ORAL
  Filled 2013-02-28 (×7): qty 1

## 2013-02-28 MED ORDER — NITROGLYCERIN 2 % TD OINT
1.0000 [in_us] | TOPICAL_OINTMENT | Freq: Once | TRANSDERMAL | Status: AC
  Administered 2013-02-28: 1 [in_us] via TOPICAL
  Filled 2013-02-28: qty 1

## 2013-02-28 MED ORDER — ACETAMINOPHEN 500 MG PO TABS
1000.0000 mg | ORAL_TABLET | Freq: Three times a day (TID) | ORAL | Status: DC | PRN
  Administered 2013-02-28: 1000 mg via ORAL
  Filled 2013-02-28: qty 2

## 2013-02-28 MED ORDER — SODIUM CHLORIDE 0.9 % IJ SOLN
3.0000 mL | Freq: Two times a day (BID) | INTRAMUSCULAR | Status: DC
  Administered 2013-02-28 – 2013-03-05 (×10): 3 mL via INTRAVENOUS

## 2013-02-28 NOTE — Telephone Encounter (Signed)
Pt reports wt gain of 10 LBS since yesterday.  Was 35 yesterday and this am was 278.  Has some unchanged BLE edema but reports dyspnea.  Has felt SOB since yesterday.  Slept poorly in recliner overnight.  Has ordered 40 mg Lasix BID, take an extra dose if needed. He has taken 80 mg already this am.   He rechecked his weight during phone call and it is 274 lb.  He also reports a dry cough. Spoke with Dr. Cathie Olden, who recommends pt go to [facility] ED for evaluation.   Pt is agreeable.  Will call his daughter for a ride. Left message for card master Wannetta Sender) on cell phone.

## 2013-02-28 NOTE — H&P (Signed)
Date: 02/28/2013               Patient Name:  [name] [name] MRN: [id number]  DOB: 27-Dec-1950 Age / Sex: 63 y.o., male   PCP: Angelica Chessman, MD         Medical Service: Internal Medicine Teaching Service         Attending Physician: Dr. Karren Cobble, MD    First Contact: Dr. Duwaine Maxin Pager: [phone]  Second Contact: Dr. Clayburn Pert Pager: [phone]       After Hours (After 5p/  First Contact Pager: [phone]  weekends / holidays): Second Contact Pager: [phone]   Chief Complaint: increased weight   History of Present Illness: [name] is a 63 year old male with a PMH of systolic HF (EF 62-03%), HTN, BPH and prior CVA presents with complaint of 10 pound weight gain since yesterday.  He called his cardiology office and was instructed to come to the ED.  He also reports increasing lower extremity edema, PND, dry cough and dyspnea that has been worsening in the past few weeks.  He denies CP, palpitations, leg pain, N/V or diarrhea.  He takes Coreg, lisinopril and Lasix and reports compliance with these medications.  Bidil had been added at hospitalization 4 weeks ago and was discontinued 2 weeks ago due to cost. His wife feels he was doing better when he was on Bidil.  He denies dietary indiscretions.  Meds: Current Facility-Administered Medications  Medication Dose Route Frequency Provider Last Rate Last Dose  . acetaminophen (TYLENOL) tablet 1,000 mg  1,000 mg Oral TID PRN Othella Boyer, MD      . furosemide (LASIX) injection 60 mg  60 mg Intravenous TID Othella Boyer, MD   60 mg at 02/28/13 1756   Current Outpatient Prescriptions  Medication Sig Dispense Refill  . acetaminophen (TYLENOL) 500 MG tablet Take 1,000 mg by mouth 2 (two) times daily as needed for moderate pain.      . carvedilol (COREG) 6.25 MG tablet Take 12.5 mg by mouth 2 (two) times daily with a meal.      . furosemide (LASIX) 40 MG tablet Take 60 mg by mouth 2 (two) times daily.      [name] lisinopril  (PRINIVIL,ZESTRIL) 10 MG tablet Take 1 tablet (10 mg total) by mouth daily.  30 tablet  6  . Magnesium 250 MG TABS Take 250 mg by mouth daily.      . potassium chloride SA (K-DUR,KLOR-CON) 20 MEQ tablet Take 1 tablet (20 mEq total) by mouth daily.  30 tablet  0    Allergies: Allergies as of 02/28/2013  . (No Known Allergies)   Past Medical History  Diagnosis Date  . Hypertension   . CHF (congestive heart failure)     "just today" (01/18/2013)  . Asthma     'as a child"   . Shortness of breath     "comes on at anytime" (01/18/2013)  . Sickle cell trait   . Stroke 12/1988  . Arthritis     "left hip; right knee" (01/18/2013)   Past Surgical History  Procedure Laterality Date  . 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 Hernia repair  2004?    "above the navel" (01/18/2013)  . Cardiac catheterization  12/1988   Family History  Problem Relation Age of Onset  . Heart attack Mother   . Hypertension Mother    History   Social History  .  Marital Status: Married    Spouse Name: N/A    Number of Children: N/A  . Years of Education: N/A   Occupational History  . Not on file.   Social History Main Topics  . Smoking status: Never Smoker   . Smokeless tobacco: Never Used     Comment: 01/18/2013 "quit smoking weed ~ 2005"  . Alcohol Use: Yes     Comment: 01/18/2013 "might have part of a drink on special occasions"  . Drug Use: Yes    Special: Marijuana  . Sexual Activity: Yes   Other Topics Concern  . Not on file   Social History Narrative  . No narrative on file    Review of Systems: Pertinent items noted in HPI.  Physical Exam: Blood pressure 145/106, pulse 65, temperature 97.9 F (36.6 C), temperature source Oral, resp. rate 19, height 6\' 4"  (1.93 m), weight 125.102 kg (275 lb 12.8 oz), SpO2 99.00%. General: sitting up in bed HEENT: PERRL, EOMI, no scleral icterus Cardiac: RRR, no rubs, murmurs or gallops, no JVD Pulm: clear to auscultation  bilaterally, moving normal volumes of air Abd: soft, nontender, nondistended, BS present Ext: warm and well perfused, 2-3 + pitting B/L lower extremity edema Neuro: alert and oriented X3, cranial nerves II-XII grossly intact, 5/5 strength upper and lower extremities  Lab results: Basic Metabolic Panel:  Recent Labs  02/28/13 1315  NA 142  K 4.2  CL 103  CO2 29  GLUCOSE 109*  BUN 23  CREATININE 1.34  CALCIUM 9.1  MG 2.2   CBC:  Recent Labs  02/28/13 1315  WBC 8.9  HGB 12.7*  HCT 36.9*  MCV 87.6  PLT 232   BNP:  Recent Labs  02/28/13 1315  PROBNP 6365.0*   Urinalysis:  Recent Labs  02/28/13 1433  COLORURINE YELLOW  LABSPEC 1.009  PHURINE 7.0  GLUCOSEU NEGATIVE  HGBUR NEGATIVE  BILIRUBINUR NEGATIVE  KETONESUR NEGATIVE  PROTEINUR NEGATIVE  UROBILINOGEN 0.2  NITRITE NEGATIVE  LEUKOCYTESUR NEGATIVE   Imaging results:  Dg Chest 2 View  02/28/2013   CLINICAL DATA:  63 year old male shortness of Breath. Initial encounter.  EXAM: CHEST  2 VIEW  COMPARISON:  01/18/2013.  FINDINGS: Stable cardiomegaly and mediastinal contours. Interval improved lung base ventilation. No pneumothorax, pulmonary edema, pleural effusion or consolidation. Visualized tracheal air column is within normal limits. No acute osseous abnormality identified. Mild thoracic scoliosis.  IMPRESSION: Moderate to severe cardiomegaly. Improved lung base ventilation and no acute cardiopulmonary abnormality.   Electronically Signed   By: Lars Pinks M.D.   On: 02/28/2013 13:54    Other results: EKG: 71 bpm, NSR, LAD, t wave inversions in lateral leads  Assessment & Plan by Problem: 63 year old male with a PMH of systolic HF (EF 82-99%), HTN, BPH and prior CVA presenting with symptoms of volume overload.  Systolic HF exacerbation:  proBNP elevated and patient with increasing edema, dyspnea, PND and now reports significant weight gain.  ACS unlikely given no CP and initial troponin negative.  PE unlikely  given absence of CP and stable vitals.  He denies dietary indiscretions.  He reports med compliance but medications have not been fully optimized. Bidil recently discontinued which may be contributing.  - admit to IMTS on telemetry - IV Lasix 60mg  TID, Coreg 12.5mg  BID, lisinopril 20mg  daily - daily weights and strict I&Os - CE x 3 - monitor BMP  HTN urgency: BP 173/126 in the ED. Reports compliance with lisinopril 10 daily, Coreg 12.5mg   BID and Lasix 60-80mg  BID. Bidil recently discontinued which may be contributing HF as above. - continue po antihypertensives, drop BP slowly - IV Lasix  - optimize BP medications   Diet:  Heart  VTE ppx:  SQ heparin  Dispo: Disposition is deferred at this time, awaiting improvement of current medical problems. Anticipated discharge in approximately 1-2 day(s).   The patient does have a current PCP (Angelica Chessman, MD) and does need an [facility] hospital follow-up appointment after discharge.  The patient does not know have transportation limitations that hinder transportation to clinic appointments.  Signed: Duwaine Maxin, DO 02/28/2013, 8:02 PM

## 2013-02-28 NOTE — ED Notes (Signed)
Cardiologist at bedside.  

## 2013-02-28 NOTE — ED Notes (Signed)
Hand off report not accepted by the floor due to pt B/P being elevated.  Pt given catapres and Lasix to lower with no success per admitting doctors request.  Floor RN advised that pt B/P is being monitored with medications by cardiologist and this is an ongoing issue and admitting doctor aware of the pt B/P concerns.  Floor RN speaking to charge to confirm they will accept the pt with current B/P 167/106.  Family at bedside and updated on status.

## 2013-02-28 NOTE — Telephone Encounter (Signed)
New message    Pt is a CHF pt.  He is retaining fluids.  Wt gain is from 268 yesterday--today 278.  Please advise

## 2013-02-28 NOTE — Progress Notes (Signed)
Spoke with MD on call tonight for IMTS. Stated pt okay to take 20mg  lisinopril tonight. Patient also ordered to have potassium supplement as patient is on his 3rd dose of IV lasix tonight. Ronnette Hila, RN

## 2013-02-28 NOTE — Progress Notes (Signed)
Pt arrived to floor in NAD pt BP 180/110 manual. Pt with 20mg  lisinopril due, pt states he already took this today, pt requests that RN notify MD. Pt with 14 beats of non-sustained v-tach. Pt asymptomatic. MD on call paged x2. Will continue to monitor. Ronnette Hila, RN

## 2013-02-28 NOTE — ED Provider Notes (Signed)
CSN: [id number]     Arrival date & time 02/28/13  1306 History   First MD Initiated Contact with Patient 02/28/13 1403     Chief Complaint  Patient presents with  . Shortness of Breath   (Consider location/radiation/quality/duration/timing/severity/associated sxs/prior Treatment) HPI Patient reports he has a history of congestive heart failure. He states he weighs himself daily. Yesterday his weight was 268. He states today his weight was 278. He states he has had some swelling of his lower extremities. He denies his abdomen  feeling bloated or swollen. He states he started having shortness of breath last night and had PND. He denies any chest pain or wheezing. He denies any fever. He talked to his cardiologist and was told to come to the ED "to get some fluid drawn off".   [facility] 16 Cardiologist Dr. Acie Fredrickson  Past Medical History  Diagnosis Date  . Hypertension   . CHF (congestive heart failure)     "just today" (01/18/2013)  . Asthma     'as a child"   . Shortness of breath     "comes on at anytime" (01/18/2013)  . Sickle cell trait   . Stroke 12/1988  . Arthritis     "left hip; right knee" (01/18/2013)   Past Surgical History  Procedure Laterality Date  . 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 Hernia repair  2004?    "above the navel" (01/18/2013)  . Cardiac catheterization  12/1988   Family History  Problem Relation Age of Onset  . Heart attack Mother   . Hypertension Mother    History  Substance Use Topics  . Smoking status: Never Smoker   . Smokeless tobacco: Never Used     Comment: 01/18/2013 "quit smoking weed ~ 2005"  . 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    Allergies   Review of patient's allergies indicates no known allergies.  Home Medications   Current Outpatient Rx  Name  Route  Sig  Dispense  Refill  . acetaminophen (TYLENOL) 500 MG tablet   Oral   Take 1,000 mg by mouth 2 (two) times daily as needed for moderate pain.         . carvedilol (COREG) 6.25 MG tablet   Oral   Take 12.5 mg by mouth 2 (two) times daily with a meal.         . furosemide (LASIX) 40 MG tablet   Oral   Take 60 mg by mouth 2 (two) times daily.         [name] lisinopril (PRINIVIL,ZESTRIL) 10 MG tablet   Oral   Take 1 tablet (10 mg total) by mouth daily.   30 tablet   6   . Magnesium 250 MG TABS   Oral   Take 250 mg by mouth daily.         . potassium chloride SA (K-DUR,KLOR-CON) 20 MEQ tablet   Oral   Take 1 tablet (20 mEq total) by mouth daily.   30 tablet   0    BP 169/122  Pulse 66  Temp(Src) 97.5 F (36.4 C) (Oral)  Resp 22  Ht 6\' 4"  (1.93 m)  Wt 275 lb 12.8 oz (125.102 kg)  BMI 33.59 kg/m2  SpO2 94%  Vital signs normal except hypertension  Physical Exam  Nursing note and vitals reviewed. Constitutional: He is oriented to person, place, and time. He appears well-developed and well-nourished.  Non-toxic appearance. He does not appear ill. No distress.  HENT:  Head: Normocephalic and atraumatic.  Right Ear: External ear normal.  Left Ear: External ear normal.  Nose: Nose normal. No mucosal edema or rhinorrhea.  Mouth/Throat: Oropharynx is clear and moist and mucous membranes are normal. No dental abscesses or uvula swelling.  Eyes: Conjunctivae and EOM are normal. Pupils are equal, round, and reactive to light.  Neck: Normal range of motion and full passive range of motion without pain. Neck supple.  Cardiovascular: Normal rate, regular rhythm and normal heart sounds.  Exam reveals no gallop and no friction rub.   No murmur heard. Pulmonary/Chest: Effort normal. No respiratory distress. He has decreased breath sounds. He has no wheezes. He  has no rhonchi. He has no rales. He exhibits no tenderness and no crepitus.  Abdominal: Soft. Normal appearance and bowel sounds are normal. He exhibits no distension. There is no tenderness. There is no rebound and no guarding.  Musculoskeletal: Normal range of motion. He exhibits edema. He exhibits no tenderness.  Moves all extremities well. Patient has edema of both legs almost up to his knees.  Neurological: He is alert and oriented to person, place, and time. He has normal strength. No cranial nerve deficit.  Skin: Skin is warm, dry and intact. No rash noted. No erythema. No pallor.  Psychiatric: He has a normal mood and affect. His speech is normal and behavior is normal. His mood appears not anxious.    ED Course  Procedures (including critical care time)  Medications  furosemide (LASIX) injection 80 mg (80 mg Intravenous Given 02/28/13 1442)  nitroGLYCERIN (NITROGLYN) 2 % ointment 1 inch (1 inch Topical Given 02/28/13 1511)   Pt has had good urine output, however, still too SOB to lay flat, sitting on side of stretcher. Agreeable to admit for further fluid diuresis.   16:37 Dr Burnard Bunting, [facility] will admit to tele, attending Dr Gus Rankin Review Results for orders placed during the hospital encounter of 02/28/13  PRO B NATRIURETIC PEPTIDE      Result Value Range   Pro B Natriuretic peptide (BNP) 6365.0 (*) 0 - 125 pg/mL  BASIC METABOLIC PANEL      Result Value Range   Sodium 142  137 - 147 mEq/L   Potassium 4.2  3.7 - 5.3 mEq/L   Chloride 103  96 - 112 mEq/L   CO2 29  19 - 32 mEq/L   Glucose, Bld 109 (*) 70 - 99 mg/dL   BUN 23  6 - 23 mg/dL   Creatinine, Ser 1.34  0.50 - 1.35 mg/dL   Calcium 9.1  8.4 - 10.5 mg/dL   GFR calc non Af Amer 56 (*) >90 mL/min   GFR calc Af Amer 64 (*) >90 mL/min  CBC      Result Value Range   WBC 8.9  4.0 - 10.5 K/uL   RBC 4.21 (*) 4.22 - 5.81 MIL/uL   Hemoglobin 12.7 (*) 13.0 - 17.0 g/dL   HCT 36.9 (*) 39.0 - 52.0 %   MCV 87.6  78.0 - 100.0 fL    MCH 30.2  26.0 - 34.0 pg   MCHC 34.4  30.0 - 36.0 g/dL   RDW 14.8  11.5 - 15.5 %   Platelets 232  150 - 400 K/uL  URINALYSIS, ROUTINE W REFLEX MICROSCOPIC      Result Value Range   Color, Urine YELLOW  YELLOW   APPearance CLEAR  CLEAR   Specific Gravity, Urine 1.009  1.005 - 1.030   pH 7.0  5.0 - 8.0   Glucose, UA NEGATIVE  NEGATIVE mg/dL   Hgb urine dipstick NEGATIVE  NEGATIVE   Bilirubin Urine NEGATIVE  NEGATIVE   Ketones, ur NEGATIVE  NEGATIVE mg/dL   Protein, ur NEGATIVE  NEGATIVE mg/dL   Urobilinogen, UA 0.2  0.0 - 1.0 mg/dL   Nitrite NEGATIVE  NEGATIVE   Leukocytes, UA NEGATIVE  NEGATIVE  POCT I-STAT TROPONIN I      Result Value Range   Troponin i, poc 0.01  0.00 - 0.08 ng/mL   Comment 3             Laboratory interpretation all normal except mild anemia, elevated BNP  Imaging Review Dg Chest 2 View  02/28/2013   CLINICAL DATA:  64 year old male shortness of Breath. Initial encounter.  EXAM: CHEST  2 VIEW  COMPARISON:  01/18/2013.  FINDINGS: Stable cardiomegaly and mediastinal contours. Interval improved lung base ventilation. No pneumothorax, pulmonary edema, pleural effusion or consolidation. Visualized tracheal air column is within normal limits. No acute osseous abnormality identified. Mild thoracic scoliosis.  IMPRESSION: Moderate to severe cardiomegaly. Improved lung base ventilation and no acute cardiopulmonary abnormality.   Electronically Signed   By: Lars Pinks M.D.   On: 02/28/2013 13:54    EKG Interpretation    Date/Time:  Thursday February 28 2013 13:10:20 EST Ventricular Rate:  71 PR Interval:  162 QRS Duration: 98 QT Interval:  408 QTC Calculation: 443 R Axis:   -48 Text Interpretation:  Normal sinus rhythm Biatrial enlargement Left axis deviation Pulmonary disease pattern Nonspecific ST and T wave abnormality Since last tracing 18 Jan 2013 T wave inversion more evident in Lateral leads Confirmed by Jachelle Fluty  MD-I, Margeart Allender ([id number]) on 02/28/2013 2:41:56 PM             MDM   1. CHF (congestive heart failure)     Plan Admit   Rolland Porter, MD, Abram SanderJanice Norrie, MD 02/28/13 [phone]

## 2013-02-28 NOTE — H&P (Signed)
Date: 02/28/2013               Patient Name:  [name] [name] MRN: [id number]  DOB: [birth year] Age / Sex: 63 y.o., male   PCP: Angelica Chessman, MD              Medical Service: Internal Medicine Teaching Service              Attending Physician: Dr. Karren Cobble, MD    First Contact: Maree Krabbe, Med Student Pager: [phone]  Second Contact: Dr. Duwaine Maxin Pager: [phone]  Third Contact Dr. Clayburn Pert Pager: [phone]       After Hours (After 5p/  First Contact Pager: [phone]  weekends / holidays): Second Contact Pager: [phone]    Chief Complaint: 10 lb weight gain in 24 hours  History of Present Illness: [name] [name] is a 63 yo man with a past medical history of CHF (LVEF 35-40% in 12/2012), HTN, sickle cell trait, stroke, and arthritis who presents with a reported 10 pound weight gain and shortness of breath in the last 24 hours. He reports that he was diagnosed with CHF in December when he was admitted to [facility] for shortness of breath, dyspnea on exertion, and orthopnea. Since then he has established care with [facility] and says he has been compliant with his home Lasix (40-80 mg bid), low-salt diet, and weighing himself at home. He says he weighed himself yesterday and his weight was 268 lbs. Overnight he could not breath well lying flat so slept in a recliner. This morning his weight was 278 lbs. He took 80 mg po lasix and called his cardiologist's office ([facility] Dr. Cathie Olden recommended that he go to the ED for evaluation. His weight in the ED was 275 lbs, from 274 lbs on 1/23.   [name] [name] notes that he has had chronic high blood pressure and says that he was prescribed Bidil during his last admission which worked well. Bidil was stopped at a recent visit to [facility] on 1/23 due to cost concerns and Lisinopril was increased at that time from 5 mg daily to 10 mg daily.  [name] [name] endorses chronic shortness of breath, dry cough, orthopnea, bilateral lower extremity pitting  edema (L>R). He says his dyspnea has gotten worse over the last 24 hours but his edema is unchanged. He denies chest pain, palpitations, nausea, vomiting, abdominal bloating, and diarrhea.   In the ED he received 80 mg IV lasix and nitroglycerin ointment. His BPs were elevated to 173/126 so he also received clonidine 0.1 mg.    Meds: Current Facility-Administered Medications  Medication Dose Route Frequency Provider Last Rate Last Dose  . acetaminophen (TYLENOL) tablet 1,000 mg  1,000 mg Oral TID PRN Othella Boyer, MD      . furosemide (LASIX) injection 60 mg  60 mg Intravenous TID Othella Boyer, MD   60 mg at 02/28/13 1756   Current Outpatient Prescriptions  Medication Sig Dispense Refill  . acetaminophen (TYLENOL) 500 MG tablet Take 1,000 mg by mouth 2 (two) times daily as needed for moderate pain.      . carvedilol (COREG) 6.25 MG tablet Take 12.5 mg by mouth 2 (two) times daily with a meal.      . furosemide (LASIX) 40 MG tablet Take 60 mg by mouth 2 (two) times daily.      [name] lisinopril (PRINIVIL,ZESTRIL) 10 MG tablet Take 1 tablet (10 mg total) by mouth daily.  30 tablet  6  .  Magnesium 250 MG TABS Take 250 mg by mouth daily.      . potassium chloride SA (K-DUR,KLOR-CON) 20 MEQ tablet Take 1 tablet (20 mEq total) by mouth daily.  30 tablet  0     Allergies: Allergies as of 02/28/2013  . (No Known Allergies)     Past Medical History: Past Medical History  Diagnosis Date  . Hypertension   . CHF (congestive heart failure)     "just today" (01/18/2013)  . Asthma     'as a child"   . Shortness of breath     "comes on at anytime" (01/18/2013)  . Sickle cell trait   . Stroke 12/1988  . Arthritis     "left hip; right knee" (01/18/2013)     Past Surgical History: Past Surgical History  Procedure Laterality Date  . 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 Hernia repair  2004?    "above the navel" (01/18/2013)  . Cardiac catheterization  12/1988     Family  History: Family History  Problem Relation Age of Onset  . Heart attack Mother   . Hypertension Mother      Social History: History   Social History  . Marital Status: Married    Spouse Name: N/A    Number of Children: N/A  . Years of Education: N/A   Occupational History  . Not on file.   Social History Main Topics  . Smoking status: Never Smoker   . Smokeless tobacco: Never Used     Comment: 01/18/2013 "quit smoking weed ~ 2005"  . Alcohol Use: Yes     Comment: 01/18/2013 "might have part of a drink on special occasions"  . Drug Use: Yes    Special: Marijuana  . Sexual Activity: Yes   Other Topics Concern  . Not on file   Social History Narrative  . No narrative on file     Review of Systems: As noted in HPI.  Physical Exam: Blood pressure 163/109, pulse 74, temperature 97.9 F (36.6 C), temperature source Oral, resp. rate 24, height $RemoveBe'6\' 4"'[id number]$  (1.93 m), weight 125.102 kg (275 lb 12.8 oz), SpO2 100.00%. General: Alert, well-appearing male sitting on side of bed, short of breath.  HEENT: PERRLA, no scleral icterus, moist mucus membranes Pulmonary: Clear to auscultation bilaterally, no wheezes or crackles noted. Decreased breath sounds in left lower lobe. Cardiovascular: Regular rate and rhythm, S1 and S2 normal, no murmur, rub, or gallop appreciated. No JVD. Bilateral 2+ lower extremity pitting edema, L slightly worse than right. Abdomen: Bowel sounds active. Soft, non-tender, non-distended. Skin: Skin color, texture, turgor normal, no rashes or lesions appreciated. Neurologic: CNII-XII intact. Strength 5/5 throughout.    Lab results: Recent Results (from the past 2160 hour(s))  CBC WITH DIFFERENTIAL     Status: Abnormal   Collection Time    01/18/13  2:21 AM      Result Value Range   WBC 12.8 (*) 4.0 - 10.5 K/uL   RBC 4.44  4.22 - 5.81 MIL/uL   Hemoglobin 14.1  13.0 - 17.0 g/dL   HCT 41.7  39.0 - 52.0 %   MCV 93.9  78.0 - 100.0 fL   MCH 31.8  26.0 - 34.0 pg    MCHC 33.8  30.0 - 36.0 g/dL   RDW 14.5  11.5 - 15.5 %   Platelets 272  150 - 400 K/uL   Neutrophils Relative % 78 (*) 43 - 77 %  Neutro Abs 10.0 (*) 1.7 - 7.7 K/uL   Lymphocytes Relative 15  12 - 46 %   Lymphs Abs 1.9  0.7 - 4.0 K/uL   Monocytes Relative 6  3 - 12 %   Monocytes Absolute 0.8  0.1 - 1.0 K/uL   Eosinophils Relative 1  0 - 5 %   Eosinophils Absolute 0.1  0.0 - 0.7 K/uL   Basophils Relative 0  0 - 1 %   Basophils Absolute 0.0  0.0 - 0.1 K/uL  PRO B NATRIURETIC PEPTIDE     Status: Abnormal   Collection Time    01/18/13  2:21 AM      Result Value Range   Pro B Natriuretic peptide (BNP) 6048.0 (*) 0 - 125 pg/mL  POCT I-STAT TROPONIN I     Status: None   Collection Time    01/18/13  2:27 AM      Result Value Range   Troponin i, poc 0.02  0.00 - 0.08 ng/mL   Comment 3            Comment: Due to the release kinetics of cTnI,     a negative result within the first hours     of the onset of symptoms does not rule out     myocardial infarction with certainty.     If myocardial infarction is still suspected,     repeat the test at appropriate intervals.  POCT I-STAT, CHEM 8     Status: Abnormal   Collection Time    01/18/13  2:28 AM      Result Value Range   Sodium 144  135 - 145 mEq/L   Potassium 3.3 (*) 3.5 - 5.1 mEq/L   Chloride 101  96 - 112 mEq/L   BUN 24 (*) 6 - 23 mg/dL   Creatinine, Ser 1.80 (*) 0.50 - 1.35 mg/dL   Glucose, Bld 136 (*) 70 - 99 mg/dL   Calcium, Ion 1.19  1.13 - 1.30 mmol/L   TCO2 30  0 - 100 mmol/L   Hemoglobin 15.6  13.0 - 17.0 g/dL   HCT 46.0  39.0 - 33.8 %  BASIC METABOLIC PANEL     Status: Abnormal   Collection Time    01/18/13  2:30 AM      Result Value Range   Sodium 141  135 - 145 mEq/L   Potassium 3.3 (*) 3.5 - 5.1 mEq/L   Chloride 100  96 - 112 mEq/L   CO2 30  19 - 32 mEq/L   Glucose, Bld 135 (*) 70 - 99 mg/dL   BUN 25 (*) 6 - 23 mg/dL   Creatinine, Ser 1.62 (*) 0.50 - 1.35 mg/dL   Calcium 9.3  8.4 - 10.5 mg/dL   GFR calc  non Af Amer 45 (*) >90 mL/min   GFR calc Af Amer 52 (*) >90 mL/min   Comment: (NOTE)     The eGFR has been calculated using the CKD EPI equation.     This calculation has not been validated in all clinical situations.     eGFR's persistently <90 mL/min signify possible Chronic Kidney     Disease.  URINALYSIS, ROUTINE W REFLEX MICROSCOPIC     Status: None   Collection Time    01/18/13  4:21 AM      Result Value Range   Color, Urine YELLOW  YELLOW   APPearance CLEAR  CLEAR   Specific Gravity, Urine 1.008  1.005 - 1.030  pH 7.0  5.0 - 8.0   Glucose, UA NEGATIVE  NEGATIVE mg/dL   Hgb urine dipstick NEGATIVE  NEGATIVE   Bilirubin Urine NEGATIVE  NEGATIVE   Ketones, ur NEGATIVE  NEGATIVE mg/dL   Protein, ur NEGATIVE  NEGATIVE mg/dL   Urobilinogen, UA 0.2  0.0 - 1.0 mg/dL   Nitrite NEGATIVE  NEGATIVE   Leukocytes, UA NEGATIVE  NEGATIVE   Comment: MICROSCOPIC NOT DONE ON URINES WITH NEGATIVE PROTEIN, BLOOD, LEUKOCYTES, NITRITE, OR GLUCOSE <1000 mg/dL.  CBC     Status: Abnormal   Collection Time    01/18/13  6:45 AM      Result Value Range   WBC 12.0 (*) 4.0 - 10.5 K/uL   RBC 4.43  4.22 - 5.81 MIL/uL   Hemoglobin 13.9  13.0 - 17.0 g/dL   HCT 92.7  06.0 - 52.8 %   MCV 93.9  78.0 - 100.0 fL   MCH 31.4  26.0 - 34.0 pg   MCHC 33.4  30.0 - 36.0 g/dL   RDW 03.6  60.0 - 65.9 %   Platelets 232  150 - 400 K/uL  CREATININE, SERUM     Status: Abnormal   Collection Time    01/18/13  6:45 AM      Result Value Range   Creatinine, Ser 1.56 (*) 0.50 - 1.35 mg/dL   GFR calc non Af Amer 47 (*) >90 mL/min   GFR calc Af Amer 54 (*) >90 mL/min   Comment: (NOTE)     The eGFR has been calculated using the CKD EPI equation.     This calculation has not been validated in all clinical situations.     eGFR's persistently <90 mL/min signify possible Chronic Kidney     Disease.  TSH     Status: None   Collection Time    01/18/13  6:45 AM      Result Value Range   TSH 1.124  0.350 - 4.500 uIU/mL    Comment: Performed at [facility]  TROPONIN I     Status: None   Collection Time    01/18/13  6:45 AM      Result Value Range   Troponin I <0.30  <0.30 ng/mL   Comment:            Due to the release kinetics of cTnI,     a negative result within the first hours     of the onset of symptoms does not rule out     myocardial infarction with certainty.     If myocardial infarction is still suspected,     repeat the test at appropriate intervals.  TROPONIN I     Status: None   Collection Time    01/18/13 11:58 AM      Result Value Range   Troponin I <0.30  <0.30 ng/mL   Comment:            Due to the release kinetics of cTnI,     a negative result within the first hours     of the onset of symptoms does not rule out     myocardial infarction with certainty.     If myocardial infarction is still suspected,     repeat the test at appropriate intervals.  TROPONIN I     Status: None   Collection Time    01/18/13  7:16 PM      Result Value Range   Troponin I <0.30  <0.30 ng/mL  Comment:            Due to the release kinetics of cTnI,     a negative result within the first hours     of the onset of symptoms does not rule out     myocardial infarction with certainty.     If myocardial infarction is still suspected,     repeat the test at appropriate intervals.  BASIC METABOLIC PANEL     Status: Abnormal   Collection Time    01/19/13  1:20 PM      Result Value Range   Sodium 141  135 - 145 mEq/L   Potassium 3.3 (*) 3.5 - 5.1 mEq/L   Chloride 101  96 - 112 mEq/L   CO2 32  19 - 32 mEq/L   Glucose, Bld 90  70 - 99 mg/dL   BUN 27 (*) 6 - 23 mg/dL   Creatinine, Ser 1.50 (*) 0.50 - 1.35 mg/dL   Calcium 8.6  8.4 - 10.5 mg/dL   GFR calc non Af Amer 49 (*) >90 mL/min   GFR calc Af Amer 57 (*) >90 mL/min   Comment: (NOTE)     The eGFR has been calculated using the CKD EPI equation.     This calculation has not been validated in all clinical situations.     eGFR's persistently  <90 mL/min signify possible Chronic Kidney     Disease.  MAGNESIUM     Status: None   Collection Time    01/20/13  6:00 AM      Result Value Range   Magnesium 2.0  1.5 - 2.5 mg/dL  BASIC METABOLIC PANEL     Status: Abnormal   Collection Time    01/20/13  6:00 AM      Result Value Range   Sodium 141  135 - 145 mEq/L   Potassium 3.4 (*) 3.5 - 5.1 mEq/L   Chloride 102  96 - 112 mEq/L   CO2 31  19 - 32 mEq/L   Glucose, Bld 115 (*) 70 - 99 mg/dL   BUN 27 (*) 6 - 23 mg/dL   Creatinine, Ser 1.43 (*) 0.50 - 1.35 mg/dL   Calcium 8.7  8.4 - 10.5 mg/dL   GFR calc non Af Amer 52 (*) >90 mL/min   GFR calc Af Amer 60 (*) >90 mL/min   Comment: (NOTE)     The eGFR has been calculated using the CKD EPI equation.     This calculation has not been validated in all clinical situations.     eGFR's persistently <90 mL/min signify possible Chronic Kidney     Disease.  GLUCOSE, CAPILLARY     Status: Abnormal   Collection Time    01/20/13  9:11 PM      Result Value Range   Glucose-Capillary 133 (*) 70 - 99 mg/dL  BASIC METABOLIC PANEL     Status: Abnormal   Collection Time    01/21/13  6:40 AM      Result Value Range   Sodium 143  135 - 145 mEq/L   Potassium 3.5  3.5 - 5.1 mEq/L   Chloride 103  96 - 112 mEq/L   CO2 31  19 - 32 mEq/L   Glucose, Bld 105 (*) 70 - 99 mg/dL   BUN 22  6 - 23 mg/dL   Creatinine, Ser 1.31  0.50 - 1.35 mg/dL   Calcium 8.8  8.4 - 10.5 mg/dL   GFR calc non Af  Amer 58 (*) >90 mL/min   GFR calc Af Amer 67 (*) >90 mL/min   Comment: (NOTE)     The eGFR has been calculated using the CKD EPI equation.     This calculation has not been validated in all clinical situations.     eGFR's persistently <90 mL/min signify possible Chronic Kidney     Disease.  PROTIME-INR     Status: None   Collection Time    01/21/13  6:40 AM      Result Value Range   Prothrombin Time 14.2  11.6 - 15.2 seconds   INR 1.12  0.00 - 1.49  POCT I-STAT 3, BLOOD GAS (G3+)     Status: Abnormal    Collection Time    01/21/13 10:31 AM      Result Value Range   pH, Arterial 7.359  7.350 - 7.450   pCO2 arterial 50.4 (*) 35.0 - 45.0 mmHg   pO2, Arterial 65.0 (*) 80.0 - 100.0 mmHg   Bicarbonate 28.4 (*) 20.0 - 24.0 mEq/L   TCO2 30  0 - 100 mmol/L   O2 Saturation 91.0     Acid-Base Excess 2.0  0.0 - 2.0 mmol/L   Sample type ARTERIAL    POCT I-STAT 3, BLOOD GAS (G3P V)     Status: Abnormal   Collection Time    01/21/13 11:10 AM      Result Value Range   pH, Ven 7.396 (*) 7.250 - 7.300   pCO2, Ven 50.7 (*) 45.0 - 50.0 mmHg   pO2, Ven 23.0 (*) 30.0 - 45.0 mmHg   Bicarbonate 31.1 (*) 20.0 - 24.0 mEq/L   TCO2 33  0 - 100 mmol/L   O2 Saturation 39.0     Acid-Base Excess 5.0 (*) 0.0 - 2.0 mmol/L   Sample type VENOUS     Comment NOTIFIED PHYSICIAN    POCT I-STAT 3, BLOOD GAS (G3P V)     Status: Abnormal   Collection Time    01/21/13 11:14 AM      Result Value Range   pH, Ven 7.386 (*) 7.250 - 7.300   pCO2, Ven 51.6 (*) 45.0 - 50.0 mmHg   pO2, Ven 21.0 (*) 30.0 - 45.0 mmHg   Bicarbonate 30.9 (*) 20.0 - 24.0 mEq/L   TCO2 32  0 - 100 mmol/L   O2 Saturation 33.0     Acid-Base Excess 5.0 (*) 0.0 - 2.0 mmol/L   Sample type VENOUS     Comment NOTIFIED PHYSICIAN    BASIC METABOLIC PANEL     Status: Abnormal   Collection Time    01/22/13  5:43 AM      Result Value Range   Sodium 144  137 - 147 mEq/L   Comment: Please note change in reference range.   Potassium 3.7  3.7 - 5.3 mEq/L   Comment: Please note change in reference range.   Chloride 102  96 - 112 mEq/L   CO2 30  19 - 32 mEq/L   Glucose, Bld 106 (*) 70 - 99 mg/dL   BUN 22  6 - 23 mg/dL   Creatinine, Ser 1.32  0.50 - 1.35 mg/dL   Calcium 8.8  8.4 - 10.5 mg/dL   GFR calc non Af Amer 57 (*) >90 mL/min   GFR calc Af Amer 66 (*) >90 mL/min   Comment: (NOTE)     The eGFR has been calculated using the CKD EPI equation.     This calculation has not  been validated in all clinical situations.     eGFR's persistently <90 mL/min  signify possible Chronic Kidney     Disease.  BASIC METABOLIC PANEL     Status: Abnormal   Collection Time    01/23/13  4:55 AM      Result Value Range   Sodium 148 (*) 137 - 147 mEq/L   Comment: Please note change in reference range.   Potassium 4.0  3.7 - 5.3 mEq/L   Comment: Please note change in reference range.   Chloride 106  96 - 112 mEq/L   CO2 32  19 - 32 mEq/L   Glucose, Bld 106 (*) 70 - 99 mg/dL   BUN 19  6 - 23 mg/dL   Creatinine, Ser 1.23  0.50 - 1.35 mg/dL   Calcium 9.3  8.4 - 10.5 mg/dL   GFR calc non Af Amer 62 (*) >90 mL/min   GFR calc Af Amer 72 (*) >90 mL/min   Comment: (NOTE)     The eGFR has been calculated using the CKD EPI equation.     This calculation has not been validated in all clinical situations.     eGFR's persistently <90 mL/min signify possible Chronic Kidney     Disease.  CBC     Status: Abnormal   Collection Time    01/23/13  4:55 AM      Result Value Range   WBC 10.6 (*) 4.0 - 10.5 K/uL   RBC 3.96 (*) 4.22 - 5.81 MIL/uL   Hemoglobin 12.2 (*) 13.0 - 17.0 g/dL   HCT 37.4 (*) 39.0 - 52.0 %   MCV 94.4  78.0 - 100.0 fL   MCH 30.8  26.0 - 34.0 pg   MCHC 32.6  30.0 - 36.0 g/dL   RDW 14.6  11.5 - 15.5 %   Platelets 253  150 - 400 K/uL  BASIC METABOLIC PANEL     Status: Abnormal   Collection Time    02/01/13 12:32 PM      Result Value Range   Sodium 141  135 - 145 mEq/L   Potassium 3.6  3.5 - 5.1 mEq/L   Chloride 105  96 - 112 mEq/L   CO2 29  19 - 32 mEq/L   Glucose, Bld 105 (*) 70 - 99 mg/dL   BUN 15  6 - 23 mg/dL   Creatinine, Ser 1.3  0.4 - 1.5 mg/dL   Calcium 9.4  8.4 - 10.5 mg/dL   GFR 72.83  >60.00 mL/min  BRAIN NATRIURETIC PEPTIDE     Status: Abnormal   Collection Time    02/01/13 12:32 PM      Result Value Range   Pro B Natriuretic peptide (BNP) 723.0 (*) 0.0 - 100.0 pg/mL  URIC ACID     Status: None   Collection Time    02/15/13 11:03 AM      Result Value Range   Uric Acid, Serum 7.6  4.0 - 7.8 mg/dL  BASIC METABOLIC PANEL      Status: Abnormal   Collection Time    02/15/13 11:03 AM      Result Value Range   Sodium 139  135 - 145 mEq/L   Potassium 3.7  3.5 - 5.1 mEq/L   Chloride 102  96 - 112 mEq/L   CO2 32  19 - 32 mEq/L   Glucose, Bld 105 (*) 70 - 99 mg/dL   BUN 19  6 - 23 mg/dL   Creatinine, Ser 1.3  0.4 - 1.5 mg/dL   Calcium 8.9  8.4 - 10.5 mg/dL   GFR 70.91  >60.00 mL/min  BRAIN NATRIURETIC PEPTIDE     Status: Abnormal   Collection Time    02/15/13 11:03 AM      Result Value Range   Pro B Natriuretic peptide (BNP) 603.0 (*) 0.0 - 100.0 pg/mL  PRO B NATRIURETIC PEPTIDE     Status: Abnormal   Collection Time    02/28/13  1:15 PM      Result Value Range   Pro B Natriuretic peptide (BNP) 6365.0 (*) 0 - 125 pg/mL  BASIC METABOLIC PANEL     Status: Abnormal   Collection Time    02/28/13  1:15 PM      Result Value Range   Sodium 142  137 - 147 mEq/L   Potassium 4.2  3.7 - 5.3 mEq/L   Chloride 103  96 - 112 mEq/L   CO2 29  19 - 32 mEq/L   Glucose, Bld 109 (*) 70 - 99 mg/dL   BUN 23  6 - 23 mg/dL   Creatinine, Ser 1.34  0.50 - 1.35 mg/dL   Calcium 9.1  8.4 - 10.5 mg/dL   GFR calc non Af Amer 56 (*) >90 mL/min   GFR calc Af Amer 64 (*) >90 mL/min   Comment: (NOTE)     The eGFR has been calculated using the CKD EPI equation.     This calculation has not been validated in all clinical situations.     eGFR's persistently <90 mL/min signify possible Chronic Kidney     Disease.  CBC     Status: Abnormal   Collection Time    02/28/13  1:15 PM      Result Value Range   WBC 8.9  4.0 - 10.5 K/uL   RBC 4.21 (*) 4.22 - 5.81 MIL/uL   Hemoglobin 12.7 (*) 13.0 - 17.0 g/dL   HCT 36.9 (*) 39.0 - 52.0 %   MCV 87.6  78.0 - 100.0 fL   MCH 30.2  26.0 - 34.0 pg   MCHC 34.4  30.0 - 36.0 g/dL   RDW 14.8  11.5 - 15.5 %   Platelets 232  150 - 400 K/uL  POCT I-STAT TROPONIN I     Status: None   Collection Time    02/28/13  2:02 PM      Result Value Range   Troponin i, poc 0.01  0.00 - 0.08 ng/mL   Comment 3             Comment: Due to the release kinetics of cTnI,     a negative result within the first hours     of the onset of symptoms does not rule out     myocardial infarction with certainty.     If myocardial infarction is still suspected,     repeat the test at appropriate intervals.  URINALYSIS, ROUTINE W REFLEX MICROSCOPIC     Status: None   Collection Time    02/28/13  2:33 PM      Result Value Range   Color, Urine YELLOW  YELLOW   APPearance CLEAR  CLEAR   Specific Gravity, Urine 1.009  1.005 - 1.030   pH 7.0  5.0 - 8.0   Glucose, UA NEGATIVE  NEGATIVE mg/dL   Hgb urine dipstick NEGATIVE  NEGATIVE   Bilirubin Urine NEGATIVE  NEGATIVE   Ketones, ur NEGATIVE  NEGATIVE mg/dL   Protein, ur NEGATIVE  NEGATIVE mg/dL   Urobilinogen, UA 0.2  0.0 - 1.0 mg/dL   Nitrite NEGATIVE  NEGATIVE   Leukocytes, UA NEGATIVE  NEGATIVE   Comment: MICROSCOPIC NOT DONE ON URINES WITH NEGATIVE PROTEIN, BLOOD, LEUKOCYTES, NITRITE, OR GLUCOSE <1000 mg/dL.     Imaging results:  Dg Chest 2 View  02/28/2013   CLINICAL DATA:  63 year old male shortness of Breath. Initial encounter.  EXAM: CHEST  2 VIEW  COMPARISON:  01/18/2013.  FINDINGS: Stable cardiomegaly and mediastinal contours. Interval improved lung base ventilation. No pneumothorax, pulmonary edema, pleural effusion or consolidation. Visualized tracheal air column is within normal limits. No acute osseous abnormality identified. Mild thoracic scoliosis.  IMPRESSION: Moderate to severe cardiomegaly. Improved lung base ventilation and no acute cardiopulmonary abnormality.   Electronically Signed   By: Lars Pinks M.D.   On: 02/28/2013 13:54   EKG: 71 bpm, normal sinus rhythm, left axis deviation, T wave inversions in V6  Assessment & Plan by Problem: [name] [name] is a 63 yo man with a past medical history of CHF, HTN, sickle cell trait, stroke, and arthritis who presents with a chief complaint of weight gain and shortness of breath.   #Weight gain and  dyspnea: Most likely 2/2 acute on chronic heart failure (proBNP elevated 6465, subjective weight gain, dyspnea) although another possibility given sudden onset is pulmonary embolism (less likely given no chest pain, hemodynamically stable, Wells=0). We decided to r/o ACS with serial troponins given the acute onset of reported weight gain. -Lasix 60 mg tid IV -Admit to telemetry -Follow up troponins -Monitor daily weights and ins/outs -BMP in the AM to monitor K, Mg, Cr  #Hypertensive urgency, improving: BP on admission 173/126-->now 167/106. Possibly due to volume overload versus medication non-compliance (patient reports compliance with lisinopril 10 mg, Coreg 12.5 mg bid, lasix 60-80 mg bid) or recent discontinuation of Bidil. Patient given 0.1 mg clonidine in ED. -Restart home carvedilol 12.5 mg bid, lisinopril 10 mg daily -Work on maximizing HTN treatment, potentially reinitiate Bidil vs increase lisinopril   #DVT PPx: Heparin 5000 U subcutaneous q8h  #Dispo: Disposition is deferred at this time; awaiting improvement of current medical problems.   The patient does have a current PCP (JEGEDE, OLUGBEMIGA, MD) and does need an [facility] hospital follow-up appointment after discharge.   The patient does not have transportation limitations that hinder transportation to clinic appointments.  No Follow-up on file.   This is a Careers information officer Note.  The care of the patient was discussed with Dr. Redmond Pulling and the assessment and plan was formulated with their assistance.  Please see their note for official documentation of the patient encounter.   Signed: Maree Krabbe, Med Student 02/28/2013, 6:58 PM

## 2013-02-28 NOTE — ED Notes (Signed)
Internal medicine paged.  MD released lasix and Clonidine to administer prior to admitting to the floor.

## 2013-02-28 NOTE — ED Notes (Signed)
Pt reports having sob since last night, increases when lying down. Denies any cp but is having swelling to legs. Hx of chf. ekg done at triage. Airway intact.

## 2013-03-01 DIAGNOSIS — N183 Chronic kidney disease, stage 3 unspecified: Secondary | ICD-10-CM

## 2013-03-01 DIAGNOSIS — I5023 Acute on chronic systolic (congestive) heart failure: Principal | ICD-10-CM

## 2013-03-01 DIAGNOSIS — M109 Gout, unspecified: Secondary | ICD-10-CM

## 2013-03-01 DIAGNOSIS — I129 Hypertensive chronic kidney disease with stage 1 through stage 4 chronic kidney disease, or unspecified chronic kidney disease: Secondary | ICD-10-CM

## 2013-03-01 LAB — BASIC METABOLIC PANEL
BUN: 21 mg/dL (ref 6–23)
CALCIUM: 8.8 mg/dL (ref 8.4–10.5)
CO2: 32 mEq/L (ref 19–32)
Chloride: 101 mEq/L (ref 96–112)
Creatinine, Ser: 1.31 mg/dL (ref 0.50–1.35)
GFR calc Af Amer: 66 mL/min — ABNORMAL LOW (ref >90)
GFR calc non Af Amer: 57 mL/min — ABNORMAL LOW (ref >90)
GLUCOSE: 96 mg/dL (ref 70–99)
POTASSIUM: 3.8 meq/L (ref 3.7–5.3)
Sodium: 144 mEq/L (ref 137–147)

## 2013-03-01 LAB — TROPONIN I

## 2013-03-01 MED ORDER — TRAMADOL HCL 50 MG PO TABS
100.0000 mg | ORAL_TABLET | Freq: Four times a day (QID) | ORAL | Status: DC | PRN
  Administered 2013-03-01 – 2013-03-02 (×2): 100 mg via ORAL
  Filled 2013-03-01 (×3): qty 2

## 2013-03-01 NOTE — H&P (Signed)
  I have seen and examined the patient myself, and I have reviewed the note by Rushina Cholera, MS 3 and was present during the interview and physical exam.  Please see my separate H&P for additional findings, assessment, and plan.   Signed: Duwaine Maxin, DO 03/01/2013, 2:17 PM

## 2013-03-01 NOTE — Progress Notes (Signed)
  I have seen and examined the patient myself, and I have reviewed the note by Rushina Cholera, MS 3 and was present during the interview and physical exam.  Please see my separate H&P for additional findings, assessment, and plan.   Signed: Duwaine Maxin, DO 03/01/2013, 7:56 PM

## 2013-03-01 NOTE — Progress Notes (Signed)
CSW order received to assist patient with mediations. This is a function of Case Management and I will notify them of referral. CSW will sign off for now but will be available to assist as needed. Thanks!  Lorie Phenix. [place], [place]

## 2013-03-01 NOTE — Plan of Care (Signed)
Problem: Phase I Progression Outcomes Goal: EF % per last Echo/documented,Core Reminder form on chart Outcome: Completed/Met Date Met:  03/01/13 EF 35-40% per echo on 01/18/13 Goal: Up in chair, BRP Outcome: Completed/Met Date Met:  03/01/13 Pt steady on feet, up ad lib in room

## 2013-03-01 NOTE — Telephone Encounter (Signed)
Noted  

## 2013-03-01 NOTE — H&P (Signed)
Internal Medicine Attending Admission Note Date: 03/01/2013  Patient name: [name] [name] Medical record number: [id number] Date of birth: 01/28/1950 Age: 63 y.o. Gender: male  I saw and evaluated the patient. I reviewed the resident's note and I agree with the resident's findings and plan as documented in the resident's note.  [name] [name] is a 63 year old man with a history of nonischemic systolic heart failure with a recent ejection fraction of 35-40%, hypertension, and prior cerebrovascular accident who presents to the emergency department with a two-week history of progressive dyspnea on exertion, paroxysmal nocturnal dyspnea, increasing lower extremity edema, and a recent 10 pound weight gain over baseline. His diagnosis of heart failure was initially made in December and BiDil was started. Two weeks ago the BiDil was discontinued secondary to costs. He claims to be compliant with all of his medications and denies any dietary indiscretions. He was admitted to the internal medicine teaching service for further evaluation and care.  Since admission, he has ruled out for myocardial infarction with serial enzymes and ECGs. He has diuresed 5 L with a resultant 13 pound weight loss. He currently is now 262 pounds. Laboratory evaluation after diuresis reveals a slight decrease in the creatinine. Symptomatically he is much improved.  [name] [name] presents with a decompensated nonischemic systolic cardiomyopathy. The cause of the decompensation is likely an increase in afterload that occurred when the BiDil was stopped. He has responded well to IV diuretics and we continue to work on his afterload. He will be discharged home on oral Lasix, lisinopril at an increased dose of 20 mg by mouth daily, and maintained on his Coreg 12.5 mg by mouth twice a day. At followup, his lisinopril should be increased to 40 mg by mouth daily, which is at target dose, if appropriate. The goal is a lower afterload. He wishes to  continue receiving his cardiac care at [facility] but is interested in receiving his primary care in the [place] with [name] [name] now that he has established a relationship with her. He is agreeable to staying at least one more day for additional aggressive IV diuresis as I believe he still is volume overloaded. I anticipate he will be discharged home tomorrow with followup in [facility] in the [place].

## 2013-03-01 NOTE — Progress Notes (Signed)
Patient alert and oriented x4 throughout shift.  Vital signs stable.  Patient denies any questions or concerns at this time.  Will continue to monitor.

## 2013-03-01 NOTE — Progress Notes (Signed)
Subjective: Saw and examined [name] [name] at the bedside today. He is feeling much better with substantial improvement in his shortness of breath. Says he was able to lay flat while sleeping last night and only woke up to urinate. Denies PND. He is amenable to staying one more night for further diuresis.  Two short runs of non-sustained vtach (14 beats and 3 beats) noted on telemetry overnight. Patient was asymptomatic.  Objective: Vital signs in last 24 hours: Filed Vitals:   03/01/13 0100 03/01/13 0516 03/01/13 0519 03/01/13 0955  BP: 156/99 155/108  141/92  Pulse: 72 69  71  Temp: 97.9 F (36.6 C) 97.5 F (36.4 C)    TempSrc: Oral Oral    Resp: 18 18    Height:      Weight:   118.933 kg (262 lb 3.2 oz)   SpO2: 91% 92%  95%   Weight change:   Filed Weights   02/28/13 1315 03/01/13 0519  Weight: 125.102 kg (275 lb 12.8 oz) 118.933 kg (262 lb 3.2 oz)    Intake/Output Summary (Last 24 hours) at 03/01/13 1417 Last data filed at 03/01/13 1300  Gross per 24 hour  Intake    963 ml  Output   6075 ml  Net  -5112 ml    Physical Exam General: Alert and oriented x3, sitting up on side of bed Cardiac: RRR, no rubs, murmurs or gallops, no JVD  Pulm: clear to auscultation bilaterally, no wheezes or crackles appreciated Abdomen: normal bowel sounds, soft, nontender, nondistended Ext: warm and well perfused, 1-2 + pitting bilateral lower extremity edema L>R Neuro: alert and oriented X3, cranial nerves II-XII grossly intact  Lab Results:  Recent Labs Lab 02/28/13 1315 03/01/13 0138  NA 142 144  K 4.2 3.8  CL 103 101  CO2 29 32  GLUCOSE 109* 96  BUN 23 21  CREATININE 1.34 1.31  CALCIUM 9.1 8.8  GFRNONAA 56* 57*  GFRAA 64* 66*     Recent Labs Lab 02/28/13 1315  WBC 8.9  HGB 12.7*  HCT 36.9*  PLT 232    Micro Results: No results found for this or any previous visit (from the past 240 hour(s)).  Studies/Results: Dg Chest 2 View  02/28/2013   CLINICAL DATA:   63 year old male shortness of Breath. Initial encounter.  EXAM: CHEST  2 VIEW  COMPARISON:  01/18/2013.  FINDINGS: Stable cardiomegaly and mediastinal contours. Interval improved lung base ventilation. No pneumothorax, pulmonary edema, pleural effusion or consolidation. Visualized tracheal air column is within normal limits. No acute osseous abnormality identified. Mild thoracic scoliosis.  IMPRESSION: Moderate to severe cardiomegaly. Improved lung base ventilation and no acute cardiopulmonary abnormality.   Electronically Signed   By: Lars Pinks M.D.   On: 02/28/2013 13:54    Medications: I have reviewed the patient's current medications. Scheduled Meds: . carvedilol  12.5 mg Oral BID WC  . furosemide  60 mg Intravenous TID  . heparin  5,000 Units Subcutaneous Q8H  . lisinopril  20 mg Oral Daily  . potassium chloride SA  20 mEq Oral Daily  . sodium chloride  3 mL Intravenous Q12H   Continuous Infusions:  PRN Meds:.acetaminophen   Assessment/Plan: [name] is a 63 yo man with a past medical history of CHF, HTN, sickle cell trait, stroke, and arthritis who presents with a chief complaint of weight gain and shortness of breath.   #CHF exacerbation, improving: Improving with 5 L diuresis and 13 lbs weight loss. Serum creatinine decreased  from 1.34-->1.31. Troponins negative x3. Plan to continue aggressive diuresis for at least 1 more day to maximize treatment and minimize chances of readmission. -Lasix 60 mg tid IV  -Continue telemetry   -Monitor daily weights and ins/outs  -Continue home KCl 20 mEq daily -BMP in the AM to monitor K, Cr   #Hypertensive urgency, improving: BP on admission 173/126-->now 141/92. Perhaps attributable to recent discontinuation of Bidil.  -Continue home carvedilol 12.5 mg bid -Increase lisinopril to 20 mg daily from 10 mg daily  -Consider increasing lisinopril to 40 mg in one week as outpatient  #DVT PPx: Heparin 5000 U subcutaneous q8h  Contacts/follow  up: Follow-up Information   Follow up with Ivor Costa, MD On 03/08/2013. (9:30 AM)    Specialty:  Internal Medicine   Contact information:   [place] [address] [phone]       This is a Medical Student Note.  The care of the patient was discussed with Dr. Redmond Pulling and the assessment and plan formulated with their assistance.  Please see their attached note for official documentation of the daily encounter.   LOS: 1 day   [employer], Med Student 03/01/2013, 2:17 PM

## 2013-03-01 NOTE — Telephone Encounter (Signed)
Follow Up ° °Pt returned call//  °

## 2013-03-01 NOTE — Discharge Summary (Signed)
Patient Name:  [name] [name]  MRN: [id number]  PCP: [name] Maxin, DO  DOB:  06-30-1950       Date of Admission:  02/28/2013  Date of Discharge:  03/06/2013      Attending Physician: Dr. Karren Cobble, MD         DISCHARGE DIAGNOSES: 1.   Acute on chronic systolic heart failure 2.   Hypertensive urgency 3.   Acute gout 4.   Chronic kidney disease, stage 2   DISPOSITION AND FOLLOW-UP: [name] [name] is to follow-up with the listed providers as detailed below, at patient's visiting, please address following issues:  1) Lisinopril will need to be increased from 20mg  to 40mg  daily. 2) Resolution of gout symptoms on Colcrys.  Allopurinol can be added after in 6 weeks if he has had no further flare.  Follow-up Information   Follow up with [name] Costa, MD On 03/08/2013. (9:30 AM)    Specialty:  Internal Medicine   Contact information:   [address] [place] [address] [phone]       Future Appointments Provider Department Dept Phone   03/06/2013 8:00 AM [name] Junes, NP [place] Office [phone]   03/08/2013 9:30 AM [name] Costa, MD [place] [phone]       DISCHARGE MEDICATIONS:   Medication List    STOP taking these medications       lisinopril 10 MG tablet  Commonly known as:  PRINIVIL,ZESTRIL      TAKE these medications       acetaminophen 500 MG tablet  Commonly known as:  TYLENOL  Take 1,000 mg by mouth 2 (two) times daily as needed for moderate pain.     carvedilol 25 MG tablet  Commonly known as:  COREG  Take 1 tablet (25 mg total) by mouth 2 (two) times daily with a meal.     colchicine 0.6 MG tablet  Take 1 tablet (0.6 mg total) by mouth 2 (two) times daily.     furosemide 40 MG tablet  Commonly known as:  LASIX  Take 60 mg by mouth 2 (two) times daily.     Magnesium 250 MG Tabs  Take 250 mg by mouth daily.     potassium chloride SA 20 MEQ tablet  Commonly known as:  K-DUR,KLOR-CON    Take 1 tablet (20 mEq total) by mouth daily.       Addendum:  Hydralazine 10mg  TID and Lisinopril 20mg     CONSULTS:   Orthopedic Surgery   PROCEDURES PERFORMED:  Dg Chest 2 View  02/28/2013   CLINICAL DATA:  63 year old male shortness of Breath. Initial encounter.  EXAM: CHEST  2 VIEW  COMPARISON:  01/18/2013.  FINDINGS: Stable cardiomegaly and mediastinal contours. Interval improved lung base ventilation. No pneumothorax, pulmonary edema, pleural effusion or consolidation. Visualized tracheal air column is within normal limits. No acute osseous abnormality identified. Mild thoracic scoliosis.  IMPRESSION: Moderate to severe cardiomegaly. Improved lung base ventilation and no acute cardiopulmonary abnormality.   Electronically Signed   By: [name] [name] M.D.   On: 02/28/2013 13:54      ADMISSION DATA: H&P: [name] [name] is a 63 year old male with a PMH of systolic HF (EF 59-56%), HTN, BPH and prior CVA presents with complaint of 10 pound weight gain since yesterday. He called his cardiology office and was instructed to come to the ED. He also reports increasing lower extremity edema, PND, dry cough and dyspnea  that has been worsening in the past few weeks. He denies CP, palpitations, leg pain, N/V or diarrhea. He takes Coreg, lisinopril and Lasix and reports compliance with these medications. Bidil had been added at hospitalization 4 weeks ago and was discontinued 2 weeks ago due to cost. His wife feels he was doing better when he was on Bidil. He denies dietary indiscretions.   Physical Exam: Blood pressure 145/106, pulse 65, temperature 97.9 F (36.6 C), temperature source Oral, resp. rate 19, height 6\' 4"  (1.93 m), weight 125.102 kg (275 lb 12.8 oz), SpO2 99.00%.  General: sitting up in bed  HEENT: PERRL, EOMI, no scleral icterus  Cardiac: RRR, no rubs, murmurs or gallops, no JVD  Pulm: clear to auscultation bilaterally, moving normal volumes of air  Abd: soft, nontender, nondistended, BS  present  Ext: warm and well perfused, 2-3 + pitting B/L lower extremity edema  Neuro: alert and oriented X3, cranial nerves II-XII grossly intact, 5/5 strength upper and lower extremities  Labs: Basic Metabolic Panel:   Recent Labs   02/28/13 1315   NA  142   K  4.2   CL  103   CO2  29   GLUCOSE  109*   BUN  23   CREATININE  1.34   CALCIUM  9.1   MG  2.2    CBC:   Recent Labs   02/28/13 1315   WBC  8.9   HGB  12.7*   HCT  36.9*   MCV  87.6   PLT  232    BNP:   Recent Labs   02/28/13 1315   PROBNP  6365.0*    Urinalysis:   Recent Labs   02/28/13 1433   COLORURINE  YELLOW   LABSPEC  1.009   PHURINE  7.0   GLUCOSEU  NEGATIVE   HGBUR  NEGATIVE   BILIRUBINUR  NEGATIVE   KETONESUR  NEGATIVE   PROTEINUR  NEGATIVE   UROBILINOGEN  0.2   NITRITE  NEGATIVE   LEUKOCYTESUR  NEGATIVE     HOSPITAL COURSE: Acute on chronic systolic heart failure:  The patient came presented with increasing dyspnea, PND, edema and a reported 10 pounds of weight gain in 24 hours.  His pro-BNP was elevated to 6365.  Wells score was 0 making PE unlikely.  ACS ruled out with serial troponins and EKG.  His clinical picture seemed consistent with exacerbation of his systolic heart failure likely due to increase in blood pressure after recent discontinuation of Bidil.  We initially diuresed with IV Lasix but resumed home po as his dyspnea improved.  There was room to increase his lisinopril and Coreg.  Coreg was increased to 25mg  BID and lisinopril was increased to 20mg  daily.  He diuresed a net total of 12 L during admission.  He was discharged home in stable condition.  Cardiology and hospital follow-up have been schedule for this week.    Hypertensive urgency:  He reported compliance with antihypertensive medications.  His elevated blood pressure was likely due to discontinuation of Bidil.   Coreg and lisinopril were increased during admission and Lasix was continued. His blood pressure trended  upwards during admission and hydralazine 10mg  TID was added.  His blood pressure was stable in the 140s/70s at discharge.  Lisinopril will need to be increased to 40mg  as an outpatient.    Acute gout:  Patient reports intermittent arthralgias of the feet and ankle joint that have been occurring since December when he was diagnosed  with heart failure. Gout was considered by his care team at [facility] and uric acid was drawn (7.6 in 01/2013).  Pain was not present on admission but developed by day 3 of admission.  Pain was relieved by Percocet prn. He states that previously pain has been relieved by diuresis with Lasix.  We considered edema secondary to volume overload versus gout.  However, as his bilateral lower extremity edema improved it was clear that his right ankle was swollen.  The joint was also erythematous and warm.  Synovial fluid analysis revealed monosodium urate crystals.  He had improvement in his pain after steroid injection and starting colchicine 0.6 mg BID.  Given his CKD he will need close follow-up as an outpatient to monitor renal function and make any dosage adjustment that may be necessary.  Allopurinol will need to be added in 6 weeks as long as he has not had another flare.  Chronic kidney disease, stage 2:  GFR in the 60s.  Creatinine 1.3 and at baseline at admission.  Increased to 1.40 but returned back to baseline prior to discharge.    DISCHARGE DATA: Vital Signs: BP 160/101  Pulse 76  Temp(Src) 97.6 F (36.4 C) (Oral)  Resp 18  Ht 6\' 4"  (1.93 m)  Wt 118.933 kg (262 lb 3.2 oz)  BMI 31.93 kg/m2  SpO2 94%  Labs: Results for orders placed during the hospital encounter of 02/28/13 (from the past 24 hour(s))  TROPONIN I     Status: None   Collection Time    02/28/13  8:28 PM      Result Value Range   Troponin I <0.30  <0.30 ng/mL  TROPONIN I     Status: None   Collection Time    03/01/13  1:38 AM      Result Value Range   Troponin I <0.30  <0.30 ng/mL  BASIC  METABOLIC PANEL     Status: Abnormal   Collection Time    03/01/13  1:38 AM      Result Value Range   Sodium 144  137 - 147 mEq/L   Potassium 3.8  3.7 - 5.3 mEq/L   Chloride 101  96 - 112 mEq/L   CO2 32  19 - 32 mEq/L   Glucose, Bld 96  70 - 99 mg/dL   BUN 21  6 - 23 mg/dL   Creatinine, Ser 1.31  0.50 - 1.35 mg/dL   Calcium 8.8  8.4 - 10.5 mg/dL   GFR calc non Af Amer 57 (*) >90 mL/min   GFR calc Af Amer 66 (*) >90 mL/min     Services Ordered on Discharge: Y = Yes; Blank = No PT:   OT:   RN:   Equipment:   Other:      Time Spent on Discharge: 35 min   Signed: Duwaine [name] PGY 1, Internal Medicine Resident 03/01/2013, 8:00 PM

## 2013-03-01 NOTE — Telephone Encounter (Signed)
Please call and talk with this patient. Needs to increase his Lasix to 2 pills two times a day for the weekend - then call with update on Monday.  Needs to really cut back his salt.

## 2013-03-01 NOTE — Progress Notes (Signed)
UR completed. Patient changed to inpatient- requiring IV lasix TID 

## 2013-03-01 NOTE — Telephone Encounter (Signed)
Pt calls in today stating he is in the hospital receiving IV  Lasix and that they will be keeping him another day. States he is feeling much better today with a current weight of 262  [name] is aware pt is in the hospital & would like him to continue regimen of medication as outlined at discharge [place]

## 2013-03-01 NOTE — Telephone Encounter (Signed)
[name] [name], I think this is for you and [name] [name]. Pt never saw Scott.

## 2013-03-01 NOTE — Progress Notes (Signed)
Subjective: He had 2 episodes of non-sustained VT on telemetry last night.  [name] was seen and examined this AM.  He is feeling much better and was hoping to go home but is agreeable to staying another day for more diuresis.  Objective: Vital signs in last 24 hours: Filed Vitals:   03/01/13 0100 03/01/13 0516 03/01/13 0519 03/01/13 0955  BP: 156/99 155/108  141/92  Pulse: 72 69  71  Temp: 97.9 F (36.6 C) 97.5 F (36.4 C)    TempSrc: Oral Oral    Resp: 18 18    Height:      Weight:   118.933 kg (262 lb 3.2 oz)   SpO2: 91% 92%  95%   Weight change:   Intake/Output Summary (Last 24 hours) at 03/01/13 1418 Last data filed at 03/01/13 1300  Gross per 24 hour  Intake    963 ml  Output   6075 ml  Net  -5112 ml   General: sitting up on side of bed using his laptop  Cardiac: RRR, no rubs, murmurs or gallops, no JVD Pulm: clear to auscultation bilaterally, moving normal volumes of air, he has some pauses for breaths but not using accessory muscles and is not in respiratory distress Abd: soft, nontender, nondistended, BS present Ext: warm and well perfused, 1-2 + B/L lower extremity edema Neuro: alert and oriented X3, following commands and mentating appropriately  Lab Results: Basic Metabolic Panel:  Recent Labs Lab 02/28/13 1315 03/01/13 0138  NA 142 144  K 4.2 3.8  CL 103 101  CO2 29 32  GLUCOSE 109* 96  BUN 23 21  CREATININE 1.34 1.31  CALCIUM 9.1 8.8  MG 2.2  --    CBC:  Recent Labs Lab 02/28/13 1315  WBC 8.9  HGB 12.7*  HCT 36.9*  MCV 87.6  PLT 232   Cardiac Enzymes:  Recent Labs Lab 02/28/13 2028 03/01/13 0138  TROPONINI <0.30 <0.30   BNP:  Recent Labs Lab 02/28/13 1315  PROBNP 6365.0*   Urinalysis:  Recent Labs Lab 02/28/13 1433  COLORURINE YELLOW  LABSPEC 1.009  PHURINE 7.0  GLUCOSEU NEGATIVE  HGBUR NEGATIVE  BILIRUBINUR NEGATIVE  KETONESUR NEGATIVE  PROTEINUR NEGATIVE  UROBILINOGEN 0.2  NITRITE NEGATIVE    LEUKOCYTESUR NEGATIVE   Studies/Results: Dg Chest 2 View  02/28/2013   CLINICAL DATA:  63 year old male shortness of Breath. Initial encounter.  EXAM: CHEST  2 VIEW  COMPARISON:  01/18/2013.  FINDINGS: Stable cardiomegaly and mediastinal contours. Interval improved lung base ventilation. No pneumothorax, pulmonary edema, pleural effusion or consolidation. Visualized tracheal air column is within normal limits. No acute osseous abnormality identified. Mild thoracic scoliosis.  IMPRESSION: Moderate to severe cardiomegaly. Improved lung base ventilation and no acute cardiopulmonary abnormality.   Electronically Signed   By: Lars Pinks M.D.   On: 02/28/2013 13:54   Medications: I have reviewed the patient's current medications. Scheduled Meds: . carvedilol  12.5 mg Oral BID WC  . furosemide  60 mg Intravenous TID  . heparin  5,000 Units Subcutaneous Q8H  . lisinopril  20 mg Oral Daily  . potassium chloride SA  20 mEq Oral Daily  . sodium chloride  3 mL Intravenous Q12H   Continuous Infusions:  PRN Meds:.acetaminophen  Assessment/Plan: 63 year old male with a PMH of systolic HF (EF 24-09%), HTN, BPH and prior CVA presenting with symptoms of volume overload.  Systolic HF exacerbation:  proBNP elevated and patient with increasing edema, dyspnea, PND and now reports  significant weight gain.  He is breathing better and says he was able to sleep well last night.  He is down 5L since yesterday but still will significant lower extremity edema and could benefit from additional diuresis.   He will likely be able to go home tomorrow pending continued clinical improvement.  - admit to IMTS on telemetry - IV Lasix 60mg  TID, Coreg 12.5mg  BID, lisinopril 20mg  daily - daily weights and strict I&Os - monitor BMP  HTN urgency: BP 173/126 in the ED. Reports compliance with lisinopril 10 daily, Coreg 12.5mg  BID and Lasix 60-80mg  BID. Bidil recently discontinued which may be contributing HF as above.  Improved to  141/92 this morning.   - continue po antihypertensives and optimize BP/HF medications   Diet:  Heart  VTE ppx:  SQ heparin   Dispo: Disposition is deferred at this time, awaiting improvement of current medical problems.  Anticipated discharge in approximately 1 day(s).   The patient does have a current PCP (Angelica Chessman, MD) and does need an [facility] hospital follow-up appointment after discharge.  The patient does not know have transportation limitations that hinder transportation to clinic appointments.  .Services Needed at time of discharge: Y = Yes, Blank = No PT:   OT:   RN:   Equipment:   Other:     LOS: 1 day   [name] Maxin, DO 03/01/2013, 2:18 PM

## 2013-03-02 DIAGNOSIS — I5020 Unspecified systolic (congestive) heart failure: Secondary | ICD-10-CM

## 2013-03-02 DIAGNOSIS — M25579 Pain in unspecified ankle and joints of unspecified foot: Secondary | ICD-10-CM | POA: Diagnosis present

## 2013-03-02 DIAGNOSIS — N182 Chronic kidney disease, stage 2 (mild): Secondary | ICD-10-CM

## 2013-03-02 LAB — BASIC METABOLIC PANEL
BUN: 21 mg/dL (ref 6–23)
CHLORIDE: 104 meq/L (ref 96–112)
CO2: 30 meq/L (ref 19–32)
Calcium: 8.8 mg/dL (ref 8.4–10.5)
Creatinine, Ser: 1.34 mg/dL (ref 0.50–1.35)
GFR calc Af Amer: 64 mL/min — ABNORMAL LOW (ref >90)
GFR calc non Af Amer: 56 mL/min — ABNORMAL LOW (ref >90)
Glucose, Bld: 96 mg/dL (ref 70–99)
POTASSIUM: 3.7 meq/L (ref 3.7–5.3)
Sodium: 145 mEq/L (ref 137–147)

## 2013-03-02 LAB — MAGNESIUM: Magnesium: 2.2 mg/dL (ref 1.5–2.5)

## 2013-03-02 MED ORDER — ONDANSETRON HCL 4 MG/2ML IJ SOLN
4.0000 mg | Freq: Four times a day (QID) | INTRAMUSCULAR | Status: DC | PRN
  Administered 2013-03-02: 4 mg via INTRAVENOUS
  Filled 2013-03-02: qty 2

## 2013-03-02 MED ORDER — OXYCODONE-ACETAMINOPHEN 5-325 MG PO TABS
1.0000 | ORAL_TABLET | Freq: Four times a day (QID) | ORAL | Status: DC | PRN
  Administered 2013-03-02 – 2013-03-03 (×2): 2 via ORAL
  Administered 2013-03-04: 1 via ORAL
  Administered 2013-03-04 – 2013-03-05 (×4): 2 via ORAL
  Filled 2013-03-02 (×4): qty 2
  Filled 2013-03-02: qty 1
  Filled 2013-03-02 (×4): qty 2

## 2013-03-02 NOTE — Progress Notes (Signed)
Patient states he feels nauseous and dizzy after ambulating to bathroom.  Patient back in bed, vital signs stable.  MD aware.  PRN antiemetic given.  Will continue to monitor.

## 2013-03-02 NOTE — Progress Notes (Signed)
Subjective: Pt states that he is feeling much better and was hoping to go home but is agreeable to staying another day for more diuresis. He feels that his breathing is improved but he is having transient arthralgias in his feet, first in the left and then in the right that have been intermittent since December, per the patient and seem to be related to being volume overloaded.   Objective: Vital signs in last 24 hours: Filed Vitals:   03/01/13 2038 03/02/13 0526 03/02/13 0941 03/02/13 1135  BP: 160/102 144/99 149/96 147/110  Pulse: 76 71 69 71  Temp: 97.7 F (36.5 C) 98.5 F (36.9 C) 97.4 F (36.3 C)   TempSrc: Oral Oral Oral   Resp: 20 18 18    Height:      Weight:  258 lb 13.1 oz (117.4 kg)    SpO2: 97% 95% 92%    Weight change: -16 lb 15.7 oz (-7.702 kg)  Intake/Output Summary (Last 24 hours) at 03/02/13 1306 Last data filed at 03/02/13 1015  Gross per 24 hour  Intake    723 ml  Output   3252 ml  Net  -2529 ml   General: sitting up on side of bed using his laptop  Cardiac: RRR, no rubs, murmurs or gallops, no JVD Pulm: clear to auscultation bilaterally, moving normal volumes of air, he has some pauses for breaths but not using accessory muscles and is not in respiratory distress Abd: soft, nontender, nondistended, BS present Ext: warm and well perfused, 1-2 + B/L lower extremity edema with edema at the ankles. Mild TTP at right medial malleolus. +DP pulses bilaterally. Neuro: alert and oriented X3, following commands and mentating appropriately  Lab Results: Basic Metabolic Panel:  Recent Labs Lab 02/28/13 1315 03/01/13 0138 03/02/13 0355  NA 142 144 145  K 4.2 3.8 3.7  CL 103 101 104  CO2 29 32 30  GLUCOSE 109* 96 96  BUN 23 21 21   CREATININE 1.34 1.31 1.34  CALCIUM 9.1 8.8 8.8  MG 2.2  --  2.2   CBC:  Recent Labs Lab 02/28/13 1315  WBC 8.9  HGB 12.7*  HCT 36.9*  MCV 87.6  PLT 232   Cardiac Enzymes:  Recent Labs Lab 02/28/13 2028  03/01/13 0138  TROPONINI <0.30 <0.30   BNP:  Recent Labs Lab 02/28/13 1315  PROBNP 6365.0*   Urinalysis:  Recent Labs Lab 02/28/13 1433  COLORURINE YELLOW  LABSPEC 1.009  PHURINE 7.0  GLUCOSEU NEGATIVE  HGBUR NEGATIVE  BILIRUBINUR NEGATIVE  KETONESUR NEGATIVE  PROTEINUR NEGATIVE  UROBILINOGEN 0.2  NITRITE NEGATIVE  LEUKOCYTESUR NEGATIVE   Studies/Results: Dg Chest 2 View  02/28/2013   CLINICAL DATA:  63 year old male shortness of Breath. Initial encounter.  EXAM: CHEST  2 VIEW  COMPARISON:  01/18/2013.  FINDINGS: Stable cardiomegaly and mediastinal contours. Interval improved lung base ventilation. No pneumothorax, pulmonary edema, pleural effusion or consolidation. Visualized tracheal air column is within normal limits. No acute osseous abnormality identified. Mild thoracic scoliosis.  IMPRESSION: Moderate to severe cardiomegaly. Improved lung base ventilation and no acute cardiopulmonary abnormality.   Electronically Signed   By: Lars Pinks M.D.   On: 02/28/2013 13:54   Medications: I have reviewed the patient's current medications. Scheduled Meds: . carvedilol  12.5 mg Oral BID WC  . furosemide  60 mg Intravenous TID  . heparin  5,000 Units Subcutaneous Q8H  . lisinopril  20 mg Oral Daily  . potassium chloride SA  20 mEq Oral Daily  .  sodium chloride  3 mL Intravenous Q12H   Continuous Infusions:  PRN Meds:.acetaminophen, ondansetron, oxyCODONE-acetaminophen, traMADol  Assessment/Plan: 63 year old male with a PMH of systolic HF (EF 06-26%), HTN, BPH and prior CVA presenting with symptoms of volume overload.  Systolic HF exacerbation:  proBNP elevated to >6000 and patient with increasing edema, dyspnea, PND and now reports significant weight gain.  He is breathing better and says he was able to sleep well last night.  He is down 7.5L since admission but still will significant lower extremity edema and could benefit from continued diuresis. Renal function has remained  stable. He will likely be able to go home tomorrow pending continued clinical improvement.  - IV Lasix 60mg  TID, Coreg 12.5mg  BID, lisinopril 20mg  daily - daily weights and strict I&Os - monitor BMP  HTN urgency: BP 173/126 in the ED. Reports compliance with lisinopril 10 daily, Coreg 12.5mg  BID and Lasix 60-80mg  BID. Bidil recently discontinued which may be contributing HF as above.  Improved since admission.  - continue po antihypertensives and optimize BP/HF medications   Bilateral arthralgias of the feet: Intermittent and have been occuring since December, per pt. He states that the pain is generally relieved with increasing his diuretic. This morning he receive Toradol which has only helped his pain somewhat. He normally takes Mobic for his pain which was d/c'd on admission due to his renal function with a Cr of 1.3. His joints are edematous, consistent with his volume overload and are not warm or erythematous, so a gout flare is less likely. He is mildly TTP at the joint line at the medial malleolus, so this pain seems to be more related to volume overload with expansion at the joint causing pain. - Start Percocet for pain control while in the hospital.  CKD, stage 2: Pt with GFR around mid 60s. This appears unchanged since January, which is the 1st recorded labs that we have in the system. This will need to be monitored as an outpatient.    Diet:  Heart  VTE ppx:  SQ heparin   Dispo: Disposition is deferred at this time, awaiting improvement of current medical problems.  Anticipated discharge in approximately 1 day(s).   The patient does have a current PCP Duwaine Maxin, DO) and does need an [facility] hospital follow-up appointment after discharge.  The patient does not know have transportation limitations that hinder transportation to clinic appointments.  .Services Needed at time of discharge: Y = Yes, Blank = No PT:   OT:   RN:   Equipment:   Other:     LOS: 2 days   Otho Bellows, MD 03/02/2013, 1:06 PM

## 2013-03-02 NOTE — Progress Notes (Signed)
Patient having c/o bilateral ankle pain, PRN Percocet administered as ordered. Patient currently resting comfortably, will continue to monitor. [name] [name]

## 2013-03-02 NOTE — Progress Notes (Signed)
Patient states he is having severe pain in his ankles, both left and right intermittently.  Patient describes pain as sharp with sudden onset.  Patient states PRN tramadol "does nothing".  MD notified.  No additional orders at this time, pending assessment by MD.  Will continue to monitor.

## 2013-03-02 NOTE — Progress Notes (Signed)
Pt. Alert and oriented this am. No s/s of distress noted. Pt. C/o intermittent, throbbing ankle pain during the night which the pt. States is excrutiating and causes him to have trouble walking at times. On call MD, Elnora Morrison, notified. New orders received and implemented. Call light placed within reach. RN will continue to monitor pt. For changes in condition.

## 2013-03-03 LAB — BASIC METABOLIC PANEL
BUN: 22 mg/dL (ref 6–23)
CHLORIDE: 98 meq/L (ref 96–112)
CO2: 29 meq/L (ref 19–32)
Calcium: 9.2 mg/dL (ref 8.4–10.5)
Creatinine, Ser: 1.4 mg/dL — ABNORMAL HIGH (ref 0.50–1.35)
GFR calc Af Amer: 61 mL/min — ABNORMAL LOW (ref >90)
GFR calc non Af Amer: 53 mL/min — ABNORMAL LOW (ref >90)
GLUCOSE: 99 mg/dL (ref 70–99)
POTASSIUM: 4 meq/L (ref 3.7–5.3)
SODIUM: 141 meq/L (ref 137–147)

## 2013-03-03 MED ORDER — CARVEDILOL 25 MG PO TABS
25.0000 mg | ORAL_TABLET | Freq: Two times a day (BID) | ORAL | Status: DC
  Administered 2013-03-03 – 2013-03-06 (×6): 25 mg via ORAL
  Filled 2013-03-03 (×8): qty 1

## 2013-03-03 MED ORDER — FUROSEMIDE 40 MG PO TABS
60.0000 mg | ORAL_TABLET | Freq: Two times a day (BID) | ORAL | Status: DC
  Administered 2013-03-03 – 2013-03-06 (×7): 60 mg via ORAL
  Filled 2013-03-03 (×9): qty 1

## 2013-03-03 MED ORDER — HYDRALAZINE HCL 20 MG/ML IJ SOLN
5.0000 mg | Freq: Once | INTRAMUSCULAR | Status: AC
  Administered 2013-03-03: 5 mg via INTRAVENOUS
  Filled 2013-03-03: qty 1

## 2013-03-03 NOTE — Progress Notes (Signed)
Subjective: Pt states that he is feeling ok today. He feels that his breathing is stable and he has not had foot pain since last night. He stats that he would like to stay another day to get his blood pressure under control.  Objective: Vital signs in last 24 hours: Filed Vitals:   03/02/13 2022 03/03/13 0550 03/03/13 0900 03/03/13 1046  BP: 146/98 150/102 165/118 175/116  Pulse: 66 70 72 71  Temp: 97.6 F (36.4 C) 97.9 F (36.6 C) 97.5 F (36.4 C)   TempSrc: Oral Oral Oral   Resp: 20 17 18    Height:      Weight:  260 lb 14.4 oz (118.343 kg)    SpO2: 97% 98% 95%    Weight change: 2 lb 1.3 oz (0.943 kg)  Intake/Output Summary (Last 24 hours) at 03/03/13 1050 Last data filed at 03/03/13 0900  Gross per 24 hour  Intake   1182 ml  Output   2125 ml  Net   -943 ml   General: Lying in bed asleep  Cardiac: RRR, no rubs, murmurs or gallops, no JVD Pulm: clear to auscultation bilaterally, moving normal volumes of air, no respiratory distress Abd: soft, nontender, nondistended, BS present Ext: warm and well perfused, 1+ BLE edema with edema at the ankles. Non-TTP at the ankles. +DP pulses bilaterally. Neuro: alert and oriented X3, following commands and mentating appropriately. CN 2-12 intact. No focal motor deficits in BU&LE  Lab Results: Basic Metabolic Panel:  Recent Labs Lab 02/28/13 1315  03/02/13 0355 03/03/13 0416  NA 142  < > 145 141  K 4.2  < > 3.7 4.0  CL 103  < > 104 98  CO2 29  < > 30 29  GLUCOSE 109*  < > 96 99  BUN 23  < > 21 22  CREATININE 1.34  < > 1.34 1.40*  CALCIUM 9.1  < > 8.8 9.2  MG 2.2  --  2.2  --   < > = values in this interval not displayed. CBC:  Recent Labs Lab 02/28/13 1315  WBC 8.9  HGB 12.7*  HCT 36.9*  MCV 87.6  PLT 232   Cardiac Enzymes:  Recent Labs Lab 02/28/13 2028 03/01/13 0138  TROPONINI <0.30 <0.30   BNP:  Recent Labs Lab 02/28/13 1315  PROBNP 6365.0*   Urinalysis:  Recent Labs Lab 02/28/13 1433    COLORURINE YELLOW  LABSPEC 1.009  PHURINE 7.0  GLUCOSEU NEGATIVE  HGBUR NEGATIVE  BILIRUBINUR NEGATIVE  KETONESUR NEGATIVE  PROTEINUR NEGATIVE  UROBILINOGEN 0.2  NITRITE NEGATIVE  LEUKOCYTESUR NEGATIVE   Studies/Results: No results found.  Medications: I have reviewed the patient's current medications. Scheduled Meds: . carvedilol  25 mg Oral BID WC  . furosemide  60 mg Oral BID  . heparin  5,000 Units Subcutaneous Q8H  . hydrALAZINE  5 mg Intravenous Once  . lisinopril  20 mg Oral Daily  . potassium chloride SA  20 mEq Oral Daily  . sodium chloride  3 mL Intravenous Q12H   Continuous Infusions:  PRN Meds:.acetaminophen, ondansetron, oxyCODONE-acetaminophen, traMADol  Assessment/Plan: 63 year old male with a PMH of systolic HF (EF 10-62% from 12/26), HTN, BPH and prior CVA presenting with symptoms of volume overload.  Systolic HF exacerbation:  ProBNP elevated to >6000 and patient with increasing edema, dyspnea, PND and now reports significant weight gain.  He is breathing better and says he was able to sleep well last night.  He is down 9L since  admission with improved BLE edema. Renal function has remained stable but has slightly increased today, so I think that we have adequately diuresed him. I am stopping the IV Lasix today and transitioning him to 60mg  po BID which is the dose he was taking when he came in to the hospital. Hopefully he will be able to go home tomorrow pending clinical improvement.  - Stopping IV Lasix  - Starting Lasix 60mg  po BID - Increasing Coreg to 25mg  BID,  - Continue Lisinopril 20mg  daily - Daily weights and strict I&Os - Monitor BMP  HTN urgency: BP 173/126 in the ED. Reports compliance with lisinopril 10 daily, Coreg 12.5mg  BID and Lasix 60-80mg  BID. Bidil recently discontinued which may be contributing HF as above.  Improved initially after admission, but trending upward yesterday into today. Continuing to maximize his current therapy. Holding  off on increasing the Lisinopril at this time given his recent Cr increase, likely from diuresis. - Plan as noted above: changing to home Lasix 60mg  po BID, increasing Coreg to 25mg  BID, continuing Lisinopril 10mg  daily.  Bilateral arthralgias of the feet: Intermittent and occuring since December, per pt. He states that the pain is generally relieved with increasing his diuretic. His ankle joints are still swollen this morning but improved; consistent with his volume overload and are not warm or erythematous, so a gout flare is less likely. He has no pain this morning and is non- TTP in his bilateral ankles. He has only required 1 percocet since starting the medication yesterday - Percocet  5-325mg  q6h PRN for pain control while in the hospital.    CKD, stage 2: Pt with GFR in the 60s. This appears unchanged since January, which is the 1st recorded labs that we have in the system. This will need to continue to be monitored as an outpatient.    Diet:  Heart healthy  DVT ppx:  [place] heparin   Dispo: Disposition is deferred at this time, awaiting improvement of current medical problems.  Anticipated discharge in approximately 1 day(s).   The patient does have a current PCP Duwaine Maxin, DO) and does need an [facility] hospital follow-up appointment after discharge.  The patient does not know have transportation limitations that hinder transportation to clinic appointments.  .Services Needed at time of discharge: Y = Yes, Blank = No PT:   OT:   RN:   Equipment:   Other:     LOS: 3 days   Otho Bellows, MD 03/03/2013, 10:50 AM

## 2013-03-04 ENCOUNTER — Inpatient Hospital Stay (HOSPITAL_COMMUNITY): Payer: Medicaid Other

## 2013-03-04 LAB — BASIC METABOLIC PANEL
BUN: 21 mg/dL (ref 6–23)
CHLORIDE: 102 meq/L (ref 96–112)
CO2: 28 mEq/L (ref 19–32)
Calcium: 9.2 mg/dL (ref 8.4–10.5)
Creatinine, Ser: 1.3 mg/dL (ref 0.50–1.35)
GFR calc non Af Amer: 58 mL/min — ABNORMAL LOW (ref >90)
GFR, EST AFRICAN AMERICAN: 67 mL/min — AB (ref >90)
Glucose, Bld: 100 mg/dL — ABNORMAL HIGH (ref 70–99)
POTASSIUM: 4 meq/L (ref 3.7–5.3)
SODIUM: 142 meq/L (ref 137–147)

## 2013-03-04 LAB — HIV ANTIBODY (ROUTINE TESTING W REFLEX): HIV: NONREACTIVE

## 2013-03-04 MED ORDER — HYDRALAZINE HCL 10 MG PO TABS
10.0000 mg | ORAL_TABLET | Freq: Three times a day (TID) | ORAL | Status: DC
  Administered 2013-03-04 – 2013-03-06 (×7): 10 mg via ORAL
  Filled 2013-03-04 (×9): qty 1

## 2013-03-04 MED ORDER — LISINOPRIL 20 MG PO TABS: 20.0000 mg | ORAL_TABLET | Freq: Every day | ORAL | Status: DC

## 2013-03-04 MED ORDER — FUROSEMIDE 10 MG/ML IJ SOLN
80.0000 mg | Freq: Once | INTRAMUSCULAR | Status: AC
  Administered 2013-03-04: 80 mg via INTRAVENOUS
  Filled 2013-03-04: qty 8

## 2013-03-04 MED ORDER — HYDRALAZINE HCL 20 MG/ML IJ SOLN
10.0000 mg | Freq: Once | INTRAMUSCULAR | Status: AC
  Administered 2013-03-04: 10 mg via INTRAVENOUS
  Filled 2013-03-04: qty 1

## 2013-03-04 MED ORDER — CARVEDILOL 25 MG PO TABS
25.0000 mg | ORAL_TABLET | Freq: Two times a day (BID) | ORAL | Status: DC
Start: 2013-03-04 — End: 2013-07-10

## 2013-03-04 MED ORDER — HYDRALAZINE HCL 10 MG PO TABS: 10.0000 mg | ORAL_TABLET | Freq: Three times a day (TID) | ORAL | Status: DC

## 2013-03-04 NOTE — Progress Notes (Signed)
Internal Medicine Attending  Date: 03/04/2013  Patient name: [name] [name] Medical record number: [id number] Date of birth: [birth year] Age: 63 y.o. Gender: male  I saw and evaluated the patient. I reviewed the resident's note by Dr. Redmond Pulling and I agree with the resident's findings and plans as documented in her progress note.  We continue to diurese [name] [name] and work on his afterload.  Hydralazine was started today for afterload and heart failure.  I agree with D/C home today with follow-up in [facility] clinic to further adjust his lisinopril, hydralazine, and if necessary add isosorbide dinitrate.

## 2013-03-04 NOTE — Discharge Instructions (Addendum)
1. You have hospital follow up appointments as follows: Dr. Acie Fredrickson  03/07/2013 at [address] [phone]  Dr. Ivor Costa  03/08/13 at [place] [address] [place], [place] [address] [phone]  2. Please take all medications as prescribed.    3. If you have worsening of your symptoms or new symptoms arise, please call the clinic ([phone]), or go to the ER immediately if symptoms are severe.   Gout Gout is an inflammatory arthritis caused by a buildup of uric acid crystals in the joints. Uric acid is a chemical that is normally present in the blood. When the level of uric acid in the blood is too high it can form crystals that deposit in your joints and tissues. This causes joint redness, soreness, and swelling (inflammation). Repeat attacks are common. Over time, uric acid crystals can form into masses (tophi) near a joint, destroying bone and causing disfigurement. Gout is treatable and often preventable. CAUSES  The disease begins with elevated levels of uric acid in the blood. Uric acid is produced by your body when it breaks down a naturally found substance called purines. Certain foods you eat, such as meats and fish, contain high amounts of purines. Causes of an elevated uric acid level include:  Being passed down from parent to child (heredity).  Diseases that cause increased uric acid production (such as obesity, psoriasis, and certain cancers).  Excessive alcohol use.  Diet, especially diets rich in meat and seafood.  Medicines, including certain cancer-fighting medicines (chemotherapy), water pills (diuretics), and aspirin.  Chronic kidney disease. The kidneys are no longer able to remove uric acid well.  Problems with metabolism. Conditions strongly associated with gout include:  Obesity.  High blood pressure.  High cholesterol.  Diabetes. Not everyone with elevated uric acid levels gets gout. It is  not understood why some people get gout and others do not. Surgery, joint injury, and eating too much of certain foods are some of the factors that can lead to gout attacks. SYMPTOMS   An attack of gout comes on quickly. It causes intense pain with redness, swelling, and warmth in a joint.  Fever can occur.  Often, only one joint is involved. Certain joints are more commonly involved:  Base of the big toe.  Knee.  Ankle.  Wrist.  Finger. Without treatment, an attack usually goes away in a few days to weeks. Between attacks, you usually will not have symptoms, which is different from many other forms of arthritis. DIAGNOSIS  Your caregiver will suspect gout based on your symptoms and exam. In some cases, tests may be recommended. The tests may include:  Blood tests.  Urine tests.  X-rays.  Joint fluid exam. This exam requires a needle to remove fluid from the joint (arthrocentesis). Using a microscope, gout is confirmed when uric acid crystals are seen in the joint fluid. TREATMENT  There are two phases to gout treatment: treating the sudden onset (acute) attack and preventing attacks (prophylaxis).  Treatment of an Acute Attack.  Medicines are used. These include anti-inflammatory medicines or steroid medicines.  An injection of steroid medicine into the affected joint is sometimes necessary.  The painful joint is rested. Movement can worsen the arthritis.  You may use warm or cold treatments on painful joints, depending which works best for you.  Treatment to Prevent Attacks.  If you suffer from frequent gout attacks, your caregiver may advise preventive medicine. These medicines are started after the  acute attack subsides. These medicines either help your kidneys eliminate uric acid from your body or decrease your uric acid production. You may need to stay on these medicines for a very long time.  The early phase of treatment with preventive medicine can be associated  with an increase in acute gout attacks. For this reason, during the first few months of treatment, your caregiver may also advise you to take medicines usually used for acute gout treatment. Be sure you understand your caregiver's directions. Your caregiver may make several adjustments to your medicine dose before these medicines are effective.  Discuss dietary treatment with your caregiver or dietitian. Alcohol and drinks high in sugar and fructose and foods such as meat, poultry, and seafood can increase uric acid levels. Your caregiver or dietician can advise you on drinks and foods that should be limited. HOME CARE INSTRUCTIONS   Do not take aspirin to relieve pain. This raises uric acid levels.  Only take over-the-counter or prescription medicines for pain, discomfort, or fever as directed by your caregiver.  Rest the joint as much as possible. When in bed, keep sheets and blankets off painful areas.  Keep the affected joint raised (elevated).  Apply warm or cold treatments to painful joints. Use of warm or cold treatments depends on which works best for you.  Use crutches if the painful joint is in your leg.  Drink enough fluids to keep your urine clear or pale yellow. This helps your body get rid of uric acid. Limit alcohol, sugary drinks, and fructose drinks.  Follow your dietary instructions. Pay careful attention to the amount of protein you eat. Your daily diet should emphasize fruits, vegetables, whole grains, and fat-free or low-fat milk products. Discuss the use of coffee, vitamin C, and cherries with your caregiver or dietician. These may be helpful in lowering uric acid levels.  Maintain a healthy body weight. SEEK MEDICAL CARE IF:   You develop diarrhea, vomiting, or any side effects from medicines.  You do not feel better in 24 hours, or you are getting worse. SEEK IMMEDIATE MEDICAL CARE IF:   Your joint becomes suddenly more tender, and you have chills or a  fever. MAKE SURE YOU:   Understand these instructions.  Will watch your condition.  Will get help right away if you are not doing well or get worse. Document Released: 01/08/2000 Document Revised: 05/07/2012 Document Reviewed: 08/24/2011 [facility] Patient Information 2014 [place].

## 2013-03-04 NOTE — Progress Notes (Signed)
  I have seen and examined the patient myself, and I have reviewed the note by Rushina Cholera, MS 3 and was present during the interview and physical exam.  Please see my separate H&P for additional findings, assessment, and plan.   Signed: Duwaine Maxin, DO 03/04/2013, 2:57 PM

## 2013-03-04 NOTE — Progress Notes (Signed)
Brief Interim Progress Note  Subj-  Paged by pts nurse that pt was having Shortness of breath. Pt is a 63 y o male being managed for Systolic heart failure on Lasix- 60mg  Po BID, switched to oral meds yesterday. Pt says he can not sleep due to the difficulty breathing, and he is beginning top feel like he is retaining fluid again. No worsening of pedal edema, no chest pain, no cough. As per chat review pt weight has increased over the past 2 days by 2 pounds, consecutively, but this could be due to errors with the measuring scale as Pt is net negative about 9L.  Objective-  Gen- Pt was saturating- 97% without O2, presently on O2, and saturating 100%. Vitals- Bp- 154/107. Chest- Moving equal volumes of air, no crackles appreciated. Cardiac- RRR, no murmurs, rubs of gallops. Extremities- +1 pitting pedal edema up to lower 3rd of legs.  Plan- - Continue current diuretic dose. - Chest xray- PA and lat, ifg signs of fluid overload, will give an additional low dose of IV lasix. - Cont O2 by nasal cannula.  Bing Neighbors, MD. 03/04/2013 5:05 AM

## 2013-03-04 NOTE — Progress Notes (Addendum)
I spoke briefly with Mr. Bihm about his Heart Failure diagnosis.  He lives home with his wife.  We discussed signs and symptoms of heart failure, when to call the physician, importance of daily weights, low sodium diet, and taking medications as  prescribed.  I gave him the "Living with Heart Failure" book and started the HF educational video for him.  He plans to continue weighing himself daily.  He seems still to be a bit short of breath with conversation.  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  I will plan to see him tomorrow before discharge to reinforce education.  Carole Binning RN, BSN, PCCN--Heart Failure Art therapist.

## 2013-03-04 NOTE — Progress Notes (Signed)
Subjective:  Overnight Mr. Mcgaugh complained of SOB when he laid down to go to sleep.  He had no chest pain.  His vitals were stable.  No pulmonary edema or infiltrate on CXR.  Mr. Waibel was seen and examined by me this morning he denies SOB/CP.  He continues to have ankle pain (5/10).  His appetite is good and he has no N/V or diarrhea.  Objective: Vital signs in last 24 hours: Filed Vitals:   03/04/13 0246 03/04/13 0336 03/04/13 0340 03/04/13 0617  BP:  184/109 152/107 163/103  Pulse:  80 82 79  Temp:  98.4 F (36.9 C)  98 F (36.7 C)  TempSrc:  Oral  Oral  Resp:   22 20  Height:      Weight: 118.978 kg (262 lb 4.8 oz)     SpO2:  97%     Weight change: 0.635 kg (1 lb 6.4 oz)  Intake/Output Summary (Last 24 hours) at 03/04/13 0749 Last data filed at 03/04/13 0246  Gross per 24 hour  Intake    720 ml  Output   1800 ml  Net  -1080 ml   General: sitting up in be in NAD  Cardiac: RRR, no rubs, murmurs or gallops, no JVD Pulm: clear to auscultation bilaterally, moving normal volumes of air, no respiratory distress Abd: soft, nontender, nondistended, BS present MSK:  Right ankle TTP.  Full ROM.  No asymmetry between ankles - no temperature or swelling difference Ext: warm and well perfused, 1+ B/L LE edema legs and ankles, 2+ DP pulses bilaterally. Neuro: alert and oriented X3, following commands and mentating appropriately  Lab Results: Basic Metabolic Panel:  Recent Labs Lab 02/28/13 1315  03/02/13 0355 03/03/13 0416 03/04/13 0543  NA 142  < > 145 141 142  K 4.2  < > 3.7 4.0 4.0  CL 103  < > 104 98 102  CO2 29  < > 30 29 28   GLUCOSE 109*  < > 96 99 100*  BUN 23  < > 21 22 21   CREATININE 1.34  < > 1.34 1.40* 1.30  CALCIUM 9.1  < > 8.8 9.2 9.2  MG 2.2  --  2.2  --   --   < > = values in this interval not displayed. CBC:  Recent Labs Lab 02/28/13 1315  WBC 8.9  HGB 12.7*  HCT 36.9*  MCV 87.6  PLT 232   Cardiac Enzymes:  Recent Labs Lab  02/28/13 2028 03/01/13 0138  TROPONINI <0.30 <0.30   BNP:  Recent Labs Lab 02/28/13 1315  PROBNP 6365.0*   Urinalysis:  Recent Labs Lab 02/28/13 1433  COLORURINE YELLOW  LABSPEC 1.009  PHURINE 7.0  GLUCOSEU NEGATIVE  HGBUR NEGATIVE  BILIRUBINUR NEGATIVE  KETONESUR NEGATIVE  PROTEINUR NEGATIVE  UROBILINOGEN 0.2  NITRITE NEGATIVE  LEUKOCYTESUR NEGATIVE   Studies/Results: No results found.  Medications: I have reviewed the patient's current medications. Scheduled Meds: . carvedilol  25 mg Oral BID WC  . furosemide  60 mg Oral BID  . heparin  5,000 Units Subcutaneous Q8H  . lisinopril  20 mg Oral Daily  . potassium chloride SA  20 mEq Oral Daily  . sodium chloride  3 mL Intravenous Q12H   Continuous Infusions:  PRN Meds:.acetaminophen, ondansetron, oxyCODONE-acetaminophen  Assessment/Plan: 63 year old male with a PMH of systolic HF (EF 25-85% from 12/26), HTN, BPH and prior CVA presenting with symptoms of volume overload.  Systolic HF exacerbation:  ProBNP elevated to >6000  and patient with increasing edema, dyspnea, PND and now reports significant weight gain.  His breathing has improved and he is down 9L since admission with improved BLE edema.  No crackles on exam and no edema on CXR but will give a dose of IV Lasix today since he complained of dyspnea last night.  Hopefully he will be able to go home this afternoon. - IV Lasix x 1 dose - Continue Lasix 60mg  po BID, Coreg to 25mg  BID, lisinopril 20mg  daily - Daily weights and strict I&Os - Monitor BMP   HTN urgency: BP 173/126 in the ED. Reports compliance with lisinopril 10 daily, Coreg 12.5mg  BID and Lasix 60-80mg  BID. Bidil recently discontinued which may be contributing HF as above.  Improved initially after admission, but trending upward yesterday into today. Continuing to maximize his current therapy. Holding off on increasing the Lisinopril at this time given his recent Cr increase, likely from diuresis. -  continue Lasix 60mg  po BID, increasing Coreg to 25mg  BID, continuing Lisinopril 20mg  daily.  Bilateral arthralgias of the feet: Intermittent and occuring since December, per pt.  He states that the pain is generally relieved with increasing his diuretic.  He has pain in B/L ankles since heart failure diagnosis 2 months ago.  The pain comes and goes and was not present at admission.  Ankles are not warm or erythematous and recent uric acid was normal so gout less likely.  His legs including ankle joints are swollen 2/2 to volume overload.  He says he does not want to take Percocet but would like to try heat pack. - heat pack - Tylenol prn  CKD, stage 2: Pt with GFR in the 60s. This appears unchanged since January, which is the 1st recorded labs that we have in the system. This will need to continue to be monitored as an outpatient.  Bump in creatine from 1.34 to 1.4 but now improved to 1.30.  Diet:  Heart healthy  DVT ppx:  [place] heparin   Dispo: He is clinically stable for discharge home today.  He has follow-up scheduled with Dr. Acie Fredrickson and [name] NP on 02/11.  The patient does have a current PCP Duwaine Maxin, DO) and does need an [facility] hospital follow-up appointment after discharge.  The patient does not know have transportation limitations that hinder transportation to clinic appointments.  .Services Needed at time of discharge: Y = Yes, Blank = No PT:   OT:   RN:   Equipment:   Other:     LOS: 4 days   Duwaine Maxin, DO 03/04/2013, 7:49 AM

## 2013-03-04 NOTE — Progress Notes (Signed)
Patient complaining of shortness of breath.  Says he is starting to feel like he did when he came in the hospital. VS recorded in EPIC. BP elevated.  Pt denies pain.  Bilateral pedal/ankle edema +2.  MD paged.

## 2013-03-04 NOTE — Progress Notes (Signed)
Subjective: Saw and examined [name] at the bedside today. Shortness of breath has resolved overnight. Still reporting arthralgias, currently in right ankle. He is concerned about getting his blood pressure under control.   Objective: Vital signs in last 24 hours: Filed Vitals:   03/04/13 0340 03/04/13 0617 03/04/13 1022 03/04/13 1024  BP: 152/107 163/103 148/96 158/78  Pulse: 82 79  75  Temp:  98 F (36.7 C)    TempSrc:  Oral    Resp: 22 20  20   Height:      Weight:      SpO2:    95%   Weight change: 0.635 kg (1 lb 6.4 oz)  Filed Weights   03/02/13 0526 03/03/13 0550 03/04/13 0246  Weight: 117.4 kg (258 lb 13.1 oz) 118.343 kg (260 lb 14.4 oz) 118.978 kg (262 lb 4.8 oz)    Intake/Output Summary (Last 24 hours) at 03/04/13 1159 Last data filed at 03/04/13 1035  Gross per 24 hour  Intake    840 ml  Output   1900 ml  Net  -1060 ml    Physical Exam General: Alert and oriented x3, sitting up on side of bed Cardiac: RRR, no rubs, murmurs or gallops, no JVD  Pulm: clear to auscultation bilaterally, no wheezes or crackles appreciated Abdomen: normal bowel sounds, soft, nontender, nondistended Ext: warm and well perfused, 1-2 + pitting bilateral lower extremity edema. no redness or warmth appreciated. Neuro: alert and oriented X3, cranial nerves II-XII grossly intact  Lab Results:  Recent Labs Lab 03/02/13 0355 03/03/13 0416 03/04/13 0543  NA 145 141 142  K 3.7 4.0 4.0  CL 104 98 102  CO2 30 29 28   GLUCOSE 96 99 100*  BUN 21 22 21   CREATININE 1.34 1.40* 1.30  CALCIUM 8.8 9.2 9.2  GFRNONAA 56* 53* 58*  GFRAA 64* 61* 67*     Recent Labs Lab 02/28/13 1315  WBC 8.9  HGB 12.7*  HCT 36.9*  PLT 232    Micro Results: No results found for this or any previous visit (from the past 240 hour(s)).  Studies/Results: Dg Chest 2 View  03/04/2013   CLINICAL DATA:  Shortness of breath and cough.  EXAM: CHEST  2 VIEW  COMPARISON:  02/28/2013.  FINDINGS: The heart is  borderline enlarged but stable. Mild tortuosity of the thoracic aorta. Persistent bronchitic lung changes without focal infiltrate or overt pulmonary edema. No pleural effusions.  IMPRESSION: Stable cardiac enlargement bronchitic lung changes.   Electronically Signed   By: Kalman Jewels M.D.   On: 03/04/2013 07:52    Medications: I have reviewed the patient's current medications. Scheduled Meds: . carvedilol  25 mg Oral BID WC  . furosemide  80 mg Intravenous Once  . furosemide  60 mg Oral BID  . heparin  5,000 Units Subcutaneous Q8H  . hydrALAZINE  10 mg Oral Q8H  . lisinopril  20 mg Oral Daily  . potassium chloride SA  20 mEq Oral Daily  . sodium chloride  3 mL Intravenous Q12H   Continuous Infusions:  PRN Meds:.acetaminophen, ondansetron, oxyCODONE-acetaminophen   Assessment/Plan: [name] is a 62 yo man with a past medical history of CHF, HTN, sickle cell trait, stroke, and arthritis who presents with a chief complaint of weight gain and shortness of breath.   #CHF exacerbation: Patient reported dyspnea and PNDs overnight, since resolved. Chest xray showed no evidence of pulmonary edema. He is down 10 L since admission, although still with unchanged bilateral LE  edema. Serum creatinine down to 1.30 today from 1.4 yesterday. Planning to do one dose of IV Lasix given his SOB. Likely stable for discharge later today, will follow-up with [facility] on Wednesday.  -1 dose IV Lasix 80 mg -Continue Lasix 60 mg po BID -Continue Coreg 25 mg BID -Continue Lisinopril 20 mg daily -Continue telemetry   -Monitor daily weights and ins/outs   #Hypertensive urgency: BP on admission 173/126. Improved initially but over weekend trended up to 176/114. Hypertension is perhaps related to recent discontinuation of Bidil due to funds. Today we will add 10 mg hydralazine tid to stabilize BP. Reduction of afterload will also likely improve HF exacerbation. -Start hydralazine 10 mg tid -Continue  carvedilol 25 mg bid as above -Continue lisinopril 20 mg daily -Consider increasing lisinopril to 40 mg in one week as outpatient  #Arthralgias of ankles: Transient, have been occuring since December. Pain sometimes relieved by diuresis. Likely d/t edema although we considered gout (no warmth or erythema).  -Tylenol 1000 mg prn pain -IV Lasix as above  #CKD, stage 2:  GFR in the 60s, unchanged since January, which is the 1st recorded labs that we have in the system. This will need to continue to be monitored as an outpatient.   #DVT PPx: Heparin 5000 U subcutaneous q8h  Contacts/follow up: Follow-up Information   Follow up with Ivor Costa, MD On 03/08/2013. (9:30 AM)    Specialty:  Internal Medicine   Contact information:   [place] [address] [phone]       This is a Medical Student Note.  The care of the patient was discussed with Dr. Redmond Pulling and the assessment and plan formulated with their assistance.  Please see their attached note for official documentation of the daily encounter.   LOS: 4 days   [employer], Med Student 03/04/2013, 11:59 AM

## 2013-03-04 NOTE — Progress Notes (Signed)
Patient alert and oriented x4 throughout shift.  Wife at bedside this evening.  Patient denies any pain or shortness of breath.  Patient states his only concern is discharge plans.  Per MD, patient to be discharged after a one-time dose of hydralazine if blood pressure improves.  Patient's diastolic blood pressure remains elevated after administration.  MD aware; awaiting further orders.  Will continue to monitor.

## 2013-03-05 ENCOUNTER — Inpatient Hospital Stay (HOSPITAL_COMMUNITY): Payer: Medicaid Other

## 2013-03-05 DIAGNOSIS — M109 Gout, unspecified: Secondary | ICD-10-CM | POA: Diagnosis not present

## 2013-03-05 DIAGNOSIS — I509 Heart failure, unspecified: Secondary | ICD-10-CM

## 2013-03-05 LAB — SYNOVIAL CELL COUNT + DIFF, W/ CRYSTALS
EOSINOPHILS-SYNOVIAL: NONE SEEN % (ref 0–1)
LYMPHOCYTES-SYNOVIAL FLD: 1 % (ref 0–20)
Monocyte-Macrophage-Synovial Fluid: 23 % — ABNORMAL LOW (ref 50–90)
Neutrophil, Synovial: 76 % — ABNORMAL HIGH (ref 0–25)
WBC, SYNOVIAL: 15000 /mm3 — AB (ref 0–200)

## 2013-03-05 MED ORDER — COLCHICINE 0.6 MG PO TABS: 0.6000 mg | ORAL_TABLET | Freq: Two times a day (BID) | ORAL | Status: DC

## 2013-03-05 MED ORDER — COLCHICINE 0.6 MG PO TABS
0.6000 mg | ORAL_TABLET | Freq: Two times a day (BID) | ORAL | Status: DC
  Administered 2013-03-05 – 2013-03-06 (×2): 0.6 mg via ORAL
  Filled 2013-03-05 (×3): qty 1

## 2013-03-05 NOTE — Progress Notes (Signed)
Internal Medicine Attending  Date: 03/05/2013  Patient name: [name] [name] Medical record number: [id number] Date of birth: 05/21/1950 Age: 63 y.o. Gender: male  I saw and evaluated the patient. I reviewed the resident's note by Dr. Redmond Pulling and I agree with the resident's findings and plans as documented in her progress note.  From a heart failure standpoint he is feeling much improved with no SOB, orthopnea, or PND.  Hydralazine was started for the heart failure/elevated afterload.  The addition of isordil can be considered in the future to mimic the Bidil he could not afford. He now has left ankle pain.  Exam reveals a slightly warm and erythematous swollen right ankle.  This is concerning for gout.  We have asked Ortho to assist in obtaining left ankle joint fluid to assess for crystals (and much less likely, infection).  If he has crystals consistent with gout will treat with colchicine acutely and then maintain daily.  Four to six weeks from now, if he has no further flares we can consider the addition of allopurinol, titrating to a uric acid level < 6.0.  Disposition is pending this arthrocentesis.

## 2013-03-05 NOTE — Progress Notes (Signed)
Patient alert and oriented x4 throughout shift.  Phone and call bell within reach.  Vital signs stable.  Patient states that he has no questions, but that he is concerned that if he were to be discharged while his right foot pain is significant, he might fall at home.  Per patient, these concerns addressed by MD.  MD spoke with patient about gout diagnosis, and anticipated discharge tomorrow.  Patient verbalizes understanding and agreement with this plan.  Telemetry and IV discontinued by MD today.  Will continue to monitor.

## 2013-03-05 NOTE — Progress Notes (Signed)
Subjective:  Mr. Kohlbeck was seen and examined this morning.  He still has complaint of right ankle pain at rest and with movement.  The pain responds to Percocet and an ice pack helps.  He is able to ambulate with the assistance of his cane.  He otherwise feels well and denies dyspnea, chest pain or diarrhea.  His appetite is good and he has an empty breakfast tray at bedside.  Objective: Vital signs in last 24 hours: Filed Vitals:   03/04/13 2230 03/05/13 0356 03/05/13 0421 03/05/13 0628  BP: 124/82 152/102 151/93 140/79  Pulse: 78 87 89   Temp:  98.2 F (36.8 C)    TempSrc:  Oral    Resp:  18    Height:      Weight:  117.3 kg (258 lb 9.6 oz)    SpO2:  92%  92%   Weight change: -1.679 kg (-3 lb 11.2 oz)  Intake/Output Summary (Last 24 hours) at 03/05/13 0842 Last data filed at 03/05/13 0450  Gross per 24 hour  Intake    840 ml  Output   3475 ml  Net  -2635 ml   General: sitting up in bed in NAD  Cardiac: RRR, no rubs, murmurs or gallops, no JVD Pulm: clear to auscultation bilaterally, moving normal volumes of air, no respiratory distress Abd: soft, nontender, nondistended, BS present MSK:  Right ankle is swollen and TTP.  Left ankle is not swollen or tender.  No temperature difference between right and left ankle. Ext: warm and well perfused, trace lower extremity edema in the legs. Neuro: alert and oriented X3, following commands and mentating appropriately  Lab Results: Basic Metabolic Panel:  Recent Labs Lab 02/28/13 1315  03/02/13 0355 03/03/13 0416 03/04/13 0543  NA 142  < > 145 141 142  K 4.2  < > 3.7 4.0 4.0  CL 103  < > 104 98 102  CO2 29  < > 30 29 28   GLUCOSE 109*  < > 96 99 100*  BUN 23  < > 21 22 21   CREATININE 1.34  < > 1.34 1.40* 1.30  CALCIUM 9.1  < > 8.8 9.2 9.2  MG 2.2  --  2.2  --   --   < > = values in this interval not displayed. CBC:  Recent Labs Lab 02/28/13 1315  WBC 8.9  HGB 12.7*  HCT 36.9*  MCV 87.6  PLT 232   Cardiac  Enzymes:  Recent Labs Lab 02/28/13 2028 03/01/13 0138  TROPONINI <0.30 <0.30   BNP:  Recent Labs Lab 02/28/13 1315  PROBNP 6365.0*   Urinalysis:  Recent Labs Lab 02/28/13 1433  COLORURINE YELLOW  LABSPEC 1.009  PHURINE 7.0  GLUCOSEU NEGATIVE  HGBUR NEGATIVE  BILIRUBINUR NEGATIVE  KETONESUR NEGATIVE  PROTEINUR NEGATIVE  UROBILINOGEN 0.2  NITRITE NEGATIVE  LEUKOCYTESUR NEGATIVE   Studies/Results: Dg Chest 2 View  03/04/2013   CLINICAL DATA:  Shortness of breath and cough.  EXAM: CHEST  2 VIEW  COMPARISON:  02/28/2013.  FINDINGS: The heart is borderline enlarged but stable. Mild tortuosity of the thoracic aorta. Persistent bronchitic lung changes without focal infiltrate or overt pulmonary edema. No pleural effusions.  IMPRESSION: Stable cardiac enlargement bronchitic lung changes.   Electronically Signed   By: Kalman Jewels M.D.   On: 03/04/2013 07:52    Medications: I have reviewed the patient's current medications. Scheduled Meds: . carvedilol  25 mg Oral BID WC  . furosemide  60 mg Oral  BID  . heparin  5,000 Units Subcutaneous Q8H  . hydrALAZINE  10 mg Oral Q8H  . lisinopril  20 mg Oral Daily  . potassium chloride SA  20 mEq Oral Daily  . sodium chloride  3 mL Intravenous Q12H   Continuous Infusions:  PRN Meds:.acetaminophen, ondansetron, oxyCODONE-acetaminophen  Assessment/Plan: 63 year old male with a PMH of systolic HF (EF 82-80% from 12/26), HTN, BPH and prior CVA presenting with symptoms of volume overload.  Systolic HF exacerbation:  ProBNP elevated to >6000 and patient with increasing edema, dyspnea, PND and now reports significant weight gain.  His breathing has improved and he is down 12L since admission with improved BLE edema.  No crackles on exam and no edema. - Continue Lasix 60mg  po BID, Coreg to 25mg  BID, lisinopril 20mg  daily - Daily weights and strict I&Os  HTN urgency: BP 173/126 in the ED.  Continuing to maximize his current therapy.  -  hydralazine 10mg  TID added yesterday - continue Lasix 60mg  po BID, increasing Coreg to 25mg  BID, continuing Lisinopril 20mg  daily.  Bilateral arthralgias of the feet/ankle: Intermittent and occuring since December, per pt.  He states that the pain is generally relieved with increasing his diuretic.  He has pain in B/L ankles since heart failure diagnosis 2 months ago.  The pain comes and goes and was not present at admission.  Right ankle is swollen today which is a change from B/L edema present on exam yesterday.  The pain responds to Percocet and he prefers ice on it.  - consult to orthopedics for joint aspiration - heat pack - Percocet prn  CKD, stage 2: Pt with GFR in the 60s. This appears unchanged since January, which is the 1st recorded labs that we have in the system. This will need to continue to be monitored as an outpatient.  Bump in creatine from 1.34 to 1.4 improved to 1.30.  Diet:  Heart healthy  DVT ppx:  [place] heparin   Dispo: Dispo pending arthrocentesis.  He has follow-up scheduled with Dr. Acie Fredrickson and [name] NP on 02/11.  The patient does have a current PCP Duwaine Maxin, DO) and does need an [facility] hospital follow-up appointment after discharge.  The patient does not know have transportation limitations that hinder transportation to clinic appointments.  .Services Needed at time of discharge: Y = Yes, Blank = No PT:   OT:   RN:   Equipment:   Other:     LOS: 5 days   Duwaine Maxin, DO 03/05/2013, 8:42 AM

## 2013-03-05 NOTE — Progress Notes (Signed)
  I have seen and examined the patient myself, and I have reviewed the note by Rushina Cholera, MS 3 and was present during the interview and physical exam.  Please see my separate H&P for additional findings, assessment, and plan.   Signed: Duwaine Maxin, DO 03/05/2013, 2:41 PM

## 2013-03-05 NOTE — Progress Notes (Signed)
I spoke again briefly with Mr. Eoff.  He articulates understanding of the previously discussed Heart Failure education.  I again reinforced signs and symptoms of HF, when to call the physician, low sodium diet, fluid restriction and importance of daily weights.  He plans to discharge today and his only complaint is still leg pain.  He has follow up at [facility] on 03/06/13.   Carole Binning RN, BSN, PCCN--Heart Failure Art therapist.

## 2013-03-05 NOTE — Consult Note (Addendum)
ORTHOPAEDIC CONSULTATION  REQUESTING PHYSICIAN: Karren Cobble, MD  Chief Complaint: right ankle pain  HPI: [name] [name] is a 63 y.o. male who complains of  3 days of atraumatic R ankle pain.   Past Medical History  Diagnosis Date  . Hypertension   . CHF (congestive heart failure)     "just today" (01/18/2013)  . Asthma     'as a child"   . Shortness of breath     "comes on at anytime" (01/18/2013)  . Sickle cell trait   . Stroke 12/1988  . Arthritis     "left hip; right knee" (01/18/2013)   Past Surgical History  Procedure Laterality Date  . 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 Hernia repair  2004?    "above the navel" (01/18/2013)  . Cardiac catheterization  12/1988   History   Social History  . Marital Status: Married    Spouse Name: N/A    Number of Children: N/A  . Years of Education: N/A   Social History Main Topics  . Smoking status: Never Smoker   . Smokeless tobacco: Never Used     Comment: 01/18/2013 "quit smoking weed ~ 2005"  . Alcohol Use: No     Comment: 01/18/2013 "might have part of a drink on special occasions"  . Drug Use: Yes    Special: Marijuana  . Sexual Activity: Yes   Other Topics Concern  . None   Social History Narrative  . None   Family History  Problem Relation Age of Onset  . Heart attack Mother   . Hypertension Mother    No Known Allergies Prior to Admission medications   Medication Sig Start Date End Date Taking? Authorizing Provider  acetaminophen (TYLENOL) 500 MG tablet Take 1,000 mg by mouth 2 (two) times daily as needed for moderate pain.   Yes Historical Provider, MD  furosemide (LASIX) 40 MG tablet Take 60 mg by mouth 2 (two) times daily.   Yes Historical Provider, MD  Magnesium 250 MG TABS Take 250 mg by mouth daily.   Yes Historical Provider, MD  potassium chloride SA (K-DUR,KLOR-CON) 20 MEQ tablet Take 1 tablet (20 mEq total) by mouth daily. 02/01/13  Yes Burtis Junes, NP  carvedilol  (COREG) 25 MG tablet Take 1 tablet (25 mg total) by mouth 2 (two) times daily with a meal. 03/04/13   Duwaine Maxin, DO  colchicine 0.6 MG tablet Take 1 tablet (0.6 mg total) by mouth 2 (two) times daily. 03/05/13   Duwaine Maxin, DO  hydrALAZINE (APRESOLINE) 10 MG tablet Take 1 tablet (10 mg total) by mouth 3 (three) times daily. 03/04/13   Duwaine Maxin, DO  lisinopril (PRINIVIL,ZESTRIL) 20 MG tablet Take 1 tablet (20 mg total) by mouth daily. 03/04/13   Duwaine Maxin, DO   Dg Chest 2 View  03/04/2013   CLINICAL DATA:  Shortness of breath and cough.  EXAM: CHEST  2 VIEW  COMPARISON:  02/28/2013.  FINDINGS: The heart is borderline enlarged but stable. Mild tortuosity of the thoracic aorta. Persistent bronchitic lung changes without focal infiltrate or overt pulmonary edema. No pleural effusions.  IMPRESSION: Stable cardiac enlargement bronchitic lung changes.   Electronically Signed   By: Kalman Jewels M.D.   On: 03/04/2013 07:52   Dg Ankle 2 Views Right  03/05/2013   CLINICAL DATA:  Pain and swelling.  EXAM: RIGHT ANKLE - 2 VIEW  COMPARISON:  None.  FINDINGS: There  is subcutaneous soft tissue edema around the ankle and on the dorsum of the foot. There is also evidence of an ankle effusion. There is slight arthritis in the anterior and medial aspects of the tibiotalar joint. There is an old deformity of the distal fibula consistent with a prior fracture. Slight dorsal spurring at the talonavicular joint.  IMPRESSION: Arthritis at the ankle joint with a joint effusion. Nonspecific subcutaneous edema around the ankle.   Electronically Signed   By: Rozetta Nunnery M.D.   On: 03/05/2013 14:24    Positive ROS: All other systems have been reviewed and were otherwise negative with the exception of those mentioned in the HPI and as above.  Labs cbc No results found for this basename: WBC, HGB, HCT, PLT,  in the last 72 hours  Labs inflam No results found for this basename: ESR, CRP,  in the last 72 hours  Labs coag No  results found for this basename: INR, PT, PTT,  in the last 72 hours   Recent Labs  03/03/13 0416 03/04/13 0543  NA 141 142  K 4.0 4.0  CL 98 102  CO2 29 28  GLUCOSE 99 100*  BUN 22 21  CREATININE 1.40* 1.30  CALCIUM 9.2 9.2    Physical Exam: Filed Vitals:   03/05/13 1500  BP: 150/96  Pulse: 80  Temp: 98 F (36.7 C)  Resp: 18   General: Alert, no acute distress Cardiovascular: No pedal edema Respiratory: No cyanosis, no use of accessory musculature GI: No organomegaly, abdomen is soft and non-tender Skin: No lesions in the area of chief complaint Neurologic: Sensation intact distally Psychiatric: Patient is competent for consent with normal mood and affect Lymphatic: No axillary or cervical lymphadenopathy  MUSCULOSKELETAL:  RLE: mild tenderness over ankle joint, painless small arc ROM, mild swelling and no erythema, NVI distally. Compartments are soft Other extremities are atraumatic with painless ROM and NVI.  Assessment: R ankle gout with mild post traumatic fibular deformity  Plan: -I performed and aspiration of his ankle with straw colored fluid with no purulent appearance. I then injected a 4:1 mixture of marcaine and depomedrol. This was done after an appropriate time out and using sterile technique ---aspiration c/w monosodium urate crystals--- -recommend PO management of gout from here out with his PCP.    Weight Bearing Status: WBAT Dispo per the primary team  F/U with me on an as needed basis.    Edmonia Lynch, D, MD Cell [phone]   03/05/2013 8:16 PM

## 2013-03-05 NOTE — Progress Notes (Signed)
Subjective: Saw and examined [name] [name] at the bedside today. His blood pressure was elevated to 179/115 last night after 10 mg of IV hydralazine. Since normalized and is stable in 140s/70s.  [name] [name] is not reporting any shortness of breath. He is still reporting arthralgias, primarily in right ankle which is swollen and painful today. He cannot bear weight on the affected joint. Discussed plan to aspirate joint.  Objective: Vital signs in last 24 hours: Filed Vitals:   03/04/13 2230 03/05/13 0356 03/05/13 0421 03/05/13 0628  BP: 124/82 152/102 151/93 140/79  Pulse: 78 87 89   Temp:  98.2 F (36.8 C)    TempSrc:  Oral    Resp:  18    Height:      Weight:  117.3 kg (258 lb 9.6 oz)    SpO2:  92%  92%   Weight change: -1.679 kg (-3 lb 11.2 oz)  Filed Weights   03/03/13 0550 03/04/13 0246 03/05/13 0356  Weight: 118.343 kg (260 lb 14.4 oz) 118.978 kg (262 lb 4.8 oz) 117.3 kg (258 lb 9.6 oz)    Intake/Output Summary (Last 24 hours) at 03/05/13 0840 Last data filed at 03/05/13 0450  Gross per 24 hour  Intake    840 ml  Output   3475 ml  Net  -2635 ml    Physical Exam General: Alert and oriented x3, sitting up on side of bed Cardiac: RRR, no rubs, murmurs or gallops, no JVD  Pulm: clear to auscultation bilaterally, no wheezes or crackles appreciated Abdomen: normal bowel sounds, soft, nontender, nondistended Ext: warm and well perfused, 1-2 + pitting bilateral lower extremity edema. Right ankle and foot are swollen with erythema and warmth of medial ankle appreciated.  Neuro: alert and oriented X3, cranial nerves II-XII grossly intact  Lab Results:  Recent Labs Lab 03/02/13 0355 03/03/13 0416 03/04/13 0543  NA 145 141 142  K 3.7 4.0 4.0  CL 104 98 102  CO2 30 29 28   GLUCOSE 96 99 100*  BUN 21 22 21   CREATININE 1.34 1.40* 1.30  CALCIUM 8.8 9.2 9.2  GFRNONAA 56* 53* 58*  GFRAA 64* 61* 67*     Recent Labs Lab 02/28/13 1315  WBC 8.9  HGB 12.7*  HCT 36.9*  PLT  232    Micro Results: No results found for this or any previous visit (from the past 240 hour(s)).  Studies/Results: Dg Chest 2 View  03/04/2013   CLINICAL DATA:  Shortness of breath and cough.  EXAM: CHEST  2 VIEW  COMPARISON:  02/28/2013.  FINDINGS: The heart is borderline enlarged but stable. Mild tortuosity of the thoracic aorta. Persistent bronchitic lung changes without focal infiltrate or overt pulmonary edema. No pleural effusions.  IMPRESSION: Stable cardiac enlargement bronchitic lung changes.   Electronically Signed   By: Kalman Jewels M.D.   On: 03/04/2013 07:52    Medications: I have reviewed the patient's current medications. Scheduled Meds: . carvedilol  25 mg Oral BID WC  . furosemide  60 mg Oral BID  . heparin  5,000 Units Subcutaneous Q8H  . hydrALAZINE  10 mg Oral Q8H  . lisinopril  20 mg Oral Daily  . potassium chloride SA  20 mEq Oral Daily  . sodium chloride  3 mL Intravenous Q12H   Continuous Infusions:  PRN Meds:.acetaminophen, ondansetron, oxyCODONE-acetaminophen   Assessment/Plan: [name] [name] is a 63 yo man with a past medical history of CHF, HTN, sickle cell trait, stroke, and arthritis who presents with  a chief complaint of weight gain and shortness of breath.   #CHF exacerbation, improved:  He is down 12 L since admission, with much improved bilateral LE edema. Serum creatinine 1.30 yesterday. Likely stable for discharge later today, will follow-up with [facility] tomorrow.   #Hypertensive urgency, improved: BP on admission 173/126. 10 mg hydralazine tid added yesterday to stabilize BP. BP elevated through yesterday evening but now stable in 140s/70s. -Continue hydralazine 10 mg tid -Continue carvedilol 25 mg bid as above -Continue lisinopril 20 mg daily -Consider increasing lisinopril to 40 mg in one week as outpatient  #Arthralgias of ankles: Transient, migratory, occuring since December. Pain sometimes relieved by diuresis. Considering edema vs gout  (redness, swelling today). Will aspirate joint for gout r/o. -Consult ortho for joint aspiration -Heating pack  -Percocet prn pain  #CKD, stage 2:  GFR in the 60s, unchanged since January, which is the 1st recorded labs that we have in the system. This will need to continue to be monitored as an outpatient. Creatinine at 1.30 on 03/04/13.  #DVT PPx: Heparin 5000 U subcutaneous q8h  Contacts/follow up: Follow-up Information   Follow up with Ivor Costa, MD On 03/08/2013. (9:30 AM)    Specialty:  Internal Medicine   Contact information:   [place] [address] [phone]       Follow up with [name], NP.   Specialty:  Nurse Practitioner   Contact information:   [place] [place] [address] [phone]       This is a Medical Student Note.  The care of the patient was discussed with Dr. Redmond Pulling and the assessment and plan formulated with their assistance.  Please see their attached note for official documentation of the daily encounter.   LOS: 5 days   [employer], Med Student 03/05/2013, 8:40 AM

## 2013-03-05 NOTE — Progress Notes (Signed)
Patient with elevated BP this morning.  151-152/93-102. Asymptomatic. MD notified.  Instructed RN to give morning BP medicine early.  RN will continue to monitor. Shellee Milo, RN

## 2013-03-06 ENCOUNTER — Ambulatory Visit: Payer: Self-pay | Admitting: Nurse Practitioner

## 2013-03-06 DIAGNOSIS — M109 Gout, unspecified: Secondary | ICD-10-CM

## 2013-03-06 NOTE — Progress Notes (Signed)
Internal Medicine Attending  Date: 03/06/2013  Patient name: [name] [name] Medical record number: [id number] Date of birth: [birth year] Age: 63 y.o. Gender: male  I reviewed the resident's note by Dr. Redmond Pulling and I agree with the resident's findings and plans as documented in her progress note.  Right ankle pain is much improved after steroid injection.  Aspirate confirmed diagnosis of gout.  Adequate diuresis achieved and he is no longer symptomatic from his heart failure.  Finally, his blood pressure is also much better controlled.  I agree with discharge home today with follow-up in the [facility] for further adjustment of his heart failure, antihypertensive and gout regimens as appropriate.

## 2013-03-06 NOTE — Progress Notes (Signed)
  I have seen and examined the patient myself, and I have reviewed the note by Rushina Cholera, MS 3 and was present during the interview and physical exam.  Please see my separate H&P for additional findings, assessment, and plan.   Signed: Duwaine Maxin, DO 03/06/2013, 1:47 PM

## 2013-03-06 NOTE — Progress Notes (Signed)
Subjective: Saw and examined Mr. Batten at the bedside today. His blood pressure remains stable in the 140s/70s. He is not short of breath. He is glad to have a diagnosis of gout and has felt significant relief with steroid injection yesterday.   Objective: Vital signs in last 24 hours: Filed Vitals:   03/06/13 0530 03/06/13 0534 03/06/13 0544 03/06/13 0643  BP:  166/111 170/102 150/94  Pulse: 80     Temp: 97.9 F (36.6 C)     TempSrc: Oral     Resp: 18     Height:      Weight:  117.708 kg (259 lb 8 oz)    SpO2: 98%      Weight change: 0.409 kg (14.4 oz)  Filed Weights   03/04/13 0246 03/05/13 0356 03/06/13 0534  Weight: 118.978 kg (262 lb 4.8 oz) 117.3 kg (258 lb 9.6 oz) 117.708 kg (259 lb 8 oz)    Intake/Output Summary (Last 24 hours) at 03/06/13 0759 Last data filed at 03/05/13 2100  Gross per 24 hour  Intake    840 ml  Output   1450 ml  Net   -610 ml    Physical Exam General: Alert and oriented x3, sitting up on side of bed Cardiac: RRR, no rubs, murmurs or gallops, no JVD  Pulm: clear to auscultation bilaterally, no wheezes or crackles appreciated Abdomen: normal bowel sounds, soft, nontender, nondistended Ext: warm and well perfused, trace bilateral lower extremity edema. Right ankle swollen without erythema or warmth. Neuro: alert and oriented X3, cranial nerves II-XII grossly intact  Lab Results:  Recent Labs Lab 03/02/13 0355 03/03/13 0416 03/04/13 0543  NA 145 141 142  K 3.7 4.0 4.0  CL 104 98 102  CO2 30 29 28   GLUCOSE 96 99 100*  BUN 21 22 21   CREATININE 1.34 1.40* 1.30  CALCIUM 8.8 9.2 9.2  GFRNONAA 56* 53* 58*  GFRAA 64* 61* 67*     Recent Labs Lab 02/28/13 1315  WBC 8.9  HGB 12.7*  HCT 36.9*  PLT 232    Micro Results: Recent Results (from the past 240 hour(s))  BODY FLUID CULTURE     Status: None   Collection Time    03/05/13 12:39 PM      Result Value Ref Range Status   Specimen Description FLUID SYNOVIAL RIGHT ANKLE   Final     Special Requests Normal   Final   Gram Stain     Final   Value: MODERATE WBC PRESENT, PREDOMINANTLY PMN     NO ORGANISMS SEEN     Performed at [facility]   Culture     Final   Value: NO GROWTH 1 DAY     Performed at [facility]   Report Status PENDING   Incomplete    Studies/Results: Dg Ankle 2 Views Right  03/05/2013   CLINICAL DATA:  Pain and swelling.  EXAM: RIGHT ANKLE - 2 VIEW  COMPARISON:  None.  FINDINGS: There is subcutaneous soft tissue edema around the ankle and on the dorsum of the foot. There is also evidence of an ankle effusion. There is slight arthritis in the anterior and medial aspects of the tibiotalar joint. There is an old deformity of the distal fibula consistent with a prior fracture. Slight dorsal spurring at the talonavicular joint.  IMPRESSION: Arthritis at the ankle joint with a joint effusion. Nonspecific subcutaneous edema around the ankle.   Electronically Signed   By: Vanita Ingles.D.  On: 03/05/2013 14:24    Medications: I have reviewed the patient's current medications. Scheduled Meds: . carvedilol  25 mg Oral BID WC  . colchicine  0.6 mg Oral BID  . furosemide  60 mg Oral BID  . heparin  5,000 Units Subcutaneous 3 times per day  . hydrALAZINE  10 mg Oral 3 times per day  . lisinopril  20 mg Oral Daily  . potassium chloride SA  20 mEq Oral Daily  . sodium chloride  3 mL Intravenous Q12H   Continuous Infusions:  PRN Meds:.acetaminophen, ondansetron, oxyCODONE-acetaminophen   Assessment/Plan: Mr. Hilliker is a 63 yo man with a past medical history of CHF, HTN, sickle cell trait, stroke, and arthritis who presents with a chief complaint of weight gain and shortness of breath.   #CHF exacerbation, improved:  He is down 12.7 L since admission, with much improved bilateral LE edema. Serum creatinine 1.30 on 03/04/13. Plan to discharge later today.  #Hypertensive urgency, improved: BP on admission 173/126. 10 mg hydralazine tid added  03/04/13 to stabilize BP. BP stable in 140s/70s. -Continue hydralazine 10 mg tid -Continue carvedilol 25 mg bid as above -Continue lisinopril 20 mg daily -Consider increasing lisinopril to 40 mg as outpatient  #Gout, right ankle: Intermittent ankle arthralgias since December. Joint aspiration yesterday showed uric acid crystals. Started colchicine 0.6 mg bid. -Continue colchicine 0.6 mg.  -Start allopurinol as outpatient.  #CKD, stage 2:  GFR in the 60s, unchanged since January, which is the 1st recorded labs that we have in the system. This will need to continue to be monitored as an outpatient. Creatinine at 1.30 on 03/04/13.  #DVT PPx: Heparin 5000 U subcutaneous q8h  Contacts/follow up: Follow-up Information   Follow up with Ivor Costa, MD On 03/08/2013. (9:30 AM)    Specialty:  Internal Medicine   Contact information:   [place] [address] [phone]       Follow up with [name], NP.   Specialty:  Nurse Practitioner   Contact information:   [place]  [address] [phone]       This is a Medical Student Note.  The care of the patient was discussed with Dr. Redmond Pulling and the assessment and plan formulated with their assistance.  Please see their attached note for official documentation of the daily encounter.   LOS: 6 days   [employer], Med Student 03/06/2013, 7:59 AM

## 2013-03-06 NOTE — Progress Notes (Signed)
Pt being dc to home, dc instructions given to pt, follow up appointments and medications reviewed, pt verbalized understanding, pt stable

## 2013-03-06 NOTE — Progress Notes (Signed)
I cosign with Laura B Caldwell's assessments, medication administration, notes, intake and output, and documentation for this shift. Albert Hersch A, RN    

## 2013-03-06 NOTE — Progress Notes (Signed)
Subjective:  [name] [name] was seen and examined this morning.  He is feeling well and denies dyspnea.  His pain has improved significantly after yesterday's joint aspiration (he received steroid) and addition of colchicine last night.    Objective: Vital signs in last 24 hours: Filed Vitals:   03/06/13 0530 03/06/13 0534 03/06/13 0544 03/06/13 0643  BP:  166/111 170/102 150/94  Pulse: 80     Temp: 97.9 F (36.6 C)     TempSrc: Oral     Resp: 18     Height:      Weight:  117.708 kg (259 lb 8 oz)    SpO2: 98%      Weight change: 0.409 kg (14.4 oz)  Intake/Output Summary (Last 24 hours) at 03/06/13 0752 Last data filed at 03/05/13 2100  Gross per 24 hour  Intake    840 ml  Output   1450 ml  Net   -610 ml   General: sitting up on side of bed in NAD Cardiac: RRR, no rubs, murmurs or gallops Pulm: clear to auscultation bilaterally, moving normal volumes of air, no respiratory distress Abd: soft, nontender, nondistended, BS present MSK:  Right ankle swollen, no longer tender to palpation Ext: warm and well perfused, trace lower extremity edema in the legs Neuro: alert and oriented X3, following commands and mentating appropriately  Lab Results: Basic Metabolic Panel:  Recent Labs Lab 02/28/13 1315  03/02/13 0355 03/03/13 0416 03/04/13 0543  NA 142  < > 145 141 142  K 4.2  < > 3.7 4.0 4.0  CL 103  < > 104 98 102  CO2 29  < > 30 29 28   GLUCOSE 109*  < > 96 99 100*  BUN 23  < > 21 22 21   CREATININE 1.34  < > 1.34 1.40* 1.30  CALCIUM 9.1  < > 8.8 9.2 9.2  MG 2.2  --  2.2  --   --   < > = values in this interval not displayed. CBC:  Recent Labs Lab 02/28/13 1315  WBC 8.9  HGB 12.7*  HCT 36.9*  MCV 87.6  PLT 232   Cardiac Enzymes:  Recent Labs Lab 02/28/13 2028 03/01/13 0138  TROPONINI <0.30 <0.30   BNP:  Recent Labs Lab 02/28/13 1315  PROBNP 6365.0*   Urinalysis:  Recent Labs Lab 02/28/13 1433  COLORURINE YELLOW  LABSPEC 1.009  PHURINE 7.0    GLUCOSEU NEGATIVE  HGBUR NEGATIVE  BILIRUBINUR NEGATIVE  KETONESUR NEGATIVE  PROTEINUR NEGATIVE  UROBILINOGEN 0.2  NITRITE NEGATIVE  LEUKOCYTESUR NEGATIVE   Studies/Results: Dg Ankle 2 Views Right  03/05/2013   CLINICAL DATA:  Pain and swelling.  EXAM: RIGHT ANKLE - 2 VIEW  COMPARISON:  None.  FINDINGS: There is subcutaneous soft tissue edema around the ankle and on the dorsum of the foot. There is also evidence of an ankle effusion. There is slight arthritis in the anterior and medial aspects of the tibiotalar joint. There is an old deformity of the distal fibula consistent with a prior fracture. Slight dorsal spurring at the talonavicular joint.  IMPRESSION: Arthritis at the ankle joint with a joint effusion. Nonspecific subcutaneous edema around the ankle.   Electronically Signed   By: Rozetta Nunnery M.D.   On: 03/05/2013 14:24    Medications: I have reviewed the patient's current medications. Scheduled Meds: . carvedilol  25 mg Oral BID WC  . colchicine  0.6 mg Oral BID  . furosemide  60 mg Oral BID  .  heparin  5,000 Units Subcutaneous 3 times per day  . hydrALAZINE  10 mg Oral 3 times per day  . lisinopril  20 mg Oral Daily  . potassium chloride SA  20 mEq Oral Daily  . sodium chloride  3 mL Intravenous Q12H   Continuous Infusions:  PRN Meds:.acetaminophen, ondansetron, oxyCODONE-acetaminophen  Assessment/Plan: 63 year old male with a PMH of systolic HF (EF 83-41% from 12/26), HTN, BPH and prior CVA presenting with symptoms of volume overload.  Systolic HF exacerbation:  ProBNP elevated to >6000 and patient with increasing edema, dyspnea, PND and now reports significant weight gain.  His breathing has improved and he is down 12L since admission with improved BLE edema.  No crackles on exam.  He stable for discharge home. Cardiology follow-up has been scheduled for tomorrow at 9.45AM with Dr. Acie Fredrickson.  Hospital follow-up in [facility] on March 08, 2013. - Discharge on current regimen:  Lasix 60mg  po BID, Coreg to 25mg  BID, lisinopril 20mg  daily, hydralazine 10mg  TID - Room for further increase in medications which can be considered at cardiology follow-up.  HTN urgency: BP 173/126 in the ED.  Continuing to maximize his current therapy.  He has improved with increase in Coreg, lisinopril and addition of hydralazine. - continue Lasix 60mg  po BID, Coreg to 25mg  BID, Lisinopril 20mg  daily, hydralazine 10mg  TID.  Acute gout, right foot: Intermittent and occuring since December, per pt.  He states that the pain is generally relieved with increasing his diuretic.  He has pain in B/L ankles since heart failure diagnosis 2 months ago.  The pain comes and goes and was not present at admission.  Right ankle was significantly swollen yesterday and tender to palpation which was a change from prior exam. Pain medication and ice bring some relief.  Joint aspiration analysis was consistent with gout. - continue colcrys 06mg  BID  - low dose NSAID and/or ice as needed  CKD, stage 2: Pt with GFR in the 60s. This appears unchanged since January.  Bump in creatine from 1.34 to 1.4 improved to 1.30.  Will continue to monitor as an outpatient.   Diet:  Heart healthy  DVT ppx:  Cook heparin   Dispo: He is medically stable for discharge home.  He has follow-up scheduled with Dr. Acie Fredrickson tomorrow.  The patient does have a current PCP Duwaine Maxin, DO) and does need an [facility] hospital follow-up appointment after discharge.  The patient does not know have transportation limitations that hinder transportation to clinic appointments.  .Services Needed at time of discharge: Y = Yes, Blank = No PT:   OT:   RN:   Equipment:   Other:     LOS: 6 days   Duwaine Maxin, DO 03/06/2013, 7:52 AM

## 2013-03-07 ENCOUNTER — Ambulatory Visit (INDEPENDENT_AMBULATORY_CARE_PROVIDER_SITE_OTHER): Payer: Medicaid Other | Admitting: Cardiovascular Disease

## 2013-03-07 ENCOUNTER — Encounter: Payer: Self-pay | Admitting: Cardiovascular Disease

## 2013-03-07 VITALS — BP 150/110 | HR 70 | Ht 76.0 in | Wt 266.8 lb

## 2013-03-07 DIAGNOSIS — I5023 Acute on chronic systolic (congestive) heart failure: Secondary | ICD-10-CM

## 2013-03-07 DIAGNOSIS — I10 Essential (primary) hypertension: Secondary | ICD-10-CM

## 2013-03-07 MED ORDER — AMLODIPINE BESYLATE 5 MG PO TABS: 5.0000 mg | ORAL_TABLET | Freq: Every day | ORAL | Status: DC

## 2013-03-07 NOTE — Assessment & Plan Note (Signed)
His HTN remains elevated.  We will add amlodipine 5 mg a day. We'll have him return to see [name] and possibly one month. If his blood pressures elevated we will have her increase his hydralazine to 25 mg 3 times a day.  Will continue to gradually uptitrated his medications to achieve an acceptable blood pressure. I emphasized salt restriction today.  He'll be seeing his general medical Dr. to a regular basis as well.

## 2013-03-07 NOTE — Patient Instructions (Signed)
Your physician recommends that you schedule a follow-up appointment in: [place] NP  Your physician recommends that you schedule a follow-up appointment in: 2 MONTHS WITH DR Acie Fredrickson  START AMLODIPINE 5 MG ONCE DAILY

## 2013-03-07 NOTE — Assessment & Plan Note (Signed)
His left ventricular systolic function is moderately reduced. I suspect is due to his long-standing and poorly controlled hypertension. Will continue to be very aggressive with his blood pressure management. For now, he'll remain on carvedilol 25 mg a day, hydralazine 10 mg 3 times a day, Lasix, lisinopril.  We will gradually uptitrated his hydralazine. We'll add spironolactone as well in the near future.

## 2013-03-07 NOTE — Progress Notes (Signed)
[name] [name] Date of Birth: October 19, 1950 Medical Record #[id number]  Problem List: 1. Hypertension 2.  Chronic systolic Congestive heart failure - EF 35-44% 3. History of stroke 4. Gout  History of Present Illness: [name] is seen back today for a post hospital/TOC visit.  [name] He has had longstanding HTN, asthma, and prior CVA. Originally from the [place].   Most recently presented with increased SOB, DOE and orthopnea for 3 weeks - he was found to be hypertensive, using NSAID, etc. Had been taking some Lasix that he was prescribed while living in [place]. He was cathed - had mild CAD with elevated right sided pressures. Bidil added along with ACE. Renal function improved. EF was 35 to 40%.   Comes back today. Here alone. Medicines are all mixed up - on both OTC potassium and prescription and only taking the OTC agent. He is not taking Mobic (which was left on his discharge meds). Has not had his medicines today. His car had to be towed and so he did not eat and thus did not take his medicines. He notes that his breathing is much better. Weight is stable at home. His swelling has improved but not totally resolved. Some ankle pain. Has chronic hip pain and wanted to have THR in the future. No chest pain. Avoiding salt. Does not drink alcohol. Trying to get on Medicaid. Works part time in [place] and needs to return to work so he can buy his medicines. Some headache and he thought it was due to his medicine. He does not have a primary care doctor.   Feb. 12, 2015:  He is seen back for a visit.  His BP has remained elevated.  He was readmitted to the hospital with shortness of breath. He was aggressively diuresed but then developed gout. He had documented uric acid crystals seen on a joint aspiration.  He is not able to avoid the colchicine.  His blood pressure remains elevated.     Avoids salt - canned food, bacon, sausage.   Current Outpatient Prescriptions  Medication Sig Dispense  Refill  . acetaminophen (TYLENOL) 500 MG tablet Take 1,000 mg by mouth 2 (two) times daily as needed for moderate pain.      . carvedilol (COREG) 25 MG tablet Take 1 tablet (25 mg total) by mouth 2 (two) times daily with a meal.  60 tablet  3  . colchicine 0.6 MG tablet Take 1 tablet (0.6 mg total) by mouth 2 (two) times daily.  60 tablet  2  . furosemide (LASIX) 40 MG tablet Take 60 mg by mouth 2 (two) times daily.      . hydrALAZINE (APRESOLINE) 10 MG tablet Take 1 tablet (10 mg total) by mouth 3 (three) times daily.  90 tablet  1  . lisinopril (PRINIVIL,ZESTRIL) 20 MG tablet Take 1 tablet (20 mg total) by mouth daily.  30 tablet  1  . Magnesium 250 MG TABS Take 250 mg by mouth daily.      . potassium chloride SA (K-DUR,KLOR-CON) 20 MEQ tablet Take 1 tablet (20 mEq total) by mouth daily.  30 tablet  0   No current facility-administered medications for this visit.    No Known Allergies  Past Medical History  Diagnosis Date  . Hypertension   . CHF (congestive heart failure)     "just today" (01/18/2013)  . Asthma     'as a child"   . Shortness of breath     "comes on at  anytime" (01/18/2013)  . Sickle cell trait   . Stroke 12/1988  . Arthritis     "left hip; right knee" (01/18/2013)    Past Surgical History  Procedure Laterality Date  . 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 Hernia repair  2004?    "above the navel" (01/18/2013)  . Cardiac catheterization  12/1988    History  Smoking status  . Never Smoker   Smokeless tobacco  . Never Used    Comment: 01/18/2013 "quit smoking weed ~ 2005"    History  Alcohol Use No    Comment: 01/18/2013 "might have part of a drink on special occasions"    Family History  Problem Relation Age of Onset  . Heart attack Mother   . Hypertension Mother     Review of Systems: The review of systems is per the HPI.  All other systems were reviewed and are negative.  Physical Exam: BP 150/110  Pulse 70  Ht 6\' 4"  (1.93 m)   Wt 266 lb 12.8 oz (121.02 kg)  BMI 32.49 kg/m2 He has had no medicines today.  Patient is very pleasant and in no acute distress. He is of large stature. Skin is warm and dry. Color is normal.  HEENT is unremarkable. Normocephalic/atraumatic. PERRL. Sclera are nonicteric. Neck is supple. No masses. No JVD. Lungs are clear. Cardiac exam shows a regular rate and rhythm. Rate a little faster. +S4.  Abdomen is soft. Extremities are with 1+ edema. Gait and ROM are intact. No gross neurologic deficits noted.  Wt Readings from Last 3 Encounters:  03/07/13 266 lb 12.8 oz (121.02 kg)  03/06/13 259 lb 8 oz (117.708 kg)  02/15/13 274 lb 1.9 oz (124.34 kg)     LABORATORY DATA: BMET and BNP are pending  Lab Results  Component Value Date   WBC 8.9 02/28/2013   HGB 12.7* 02/28/2013   HCT 36.9* 02/28/2013   PLT 232 02/28/2013   GLUCOSE 100* 03/04/2013   NA 142 03/04/2013   K 4.0 03/04/2013   CL 102 03/04/2013   CREATININE 1.30 03/04/2013   BUN 21 03/04/2013   CO2 28 03/04/2013   TSH 1.124 01/18/2013   INR 1.12 01/21/2013   Echo Study Conclusions from December 2014  - Left ventricle: The cavity size was moderately dilated. Wall thickness was increased in a pattern of moderate LVH. Systolic function was moderately reduced. The estimated ejection fraction was in the range of 35% to 40%. Diffuse hypokinesis. Regional wall motion abnormalities cannot be excluded. The study is not technically sufficient to allow evaluation of LV diastolic function. - Aortic valve: Mild regurgitation. - Mitral valve: Mild regurgitation. - Left atrium: The atrium was mildly dilated. - Right ventricle: The cavity size was mildly dilated. - Right atrium: The atrium was mildly dilated. - Tricuspid valve: Moderate regurgitation. - Pulmonic valve: Moderate regurgitation. - Pulmonary arteries: PA peak pressure: 47mm Hg (S).  Coronary angiography:  Coronary dominance: right  Left mainstem: Long and normal.  Left anterior descending  (LAD): Normal. D1 large and normal.  Left circumflex (LCx): AV groove normal. OM large and normal.  Right coronary artery (RCA): Very large. Mild diffuse luminal irregularities. Long mid 40% stenosis. AM very large with PDA. Mild diffuse luminal irregularities. PL large nd normal  Left ventriculography: Left ventricle was crossed for pressures but I did not do an LV gram secondary to elevated end diastolic pressure and CKD  Final Conclusions: Mild coronary plaque. Elevated right  sided pressures.  Recommendations: Elevated right sided pressures appear to be related in part to systolic and diastolic heart failure with elevated LVEDP. However, the elevated right sided pressures were somewhat out of proportion to the EDP elevation. If he does not improve with usual treatment and lifestyle changes for acute on chronic systolic and diastolic HF I would consider pulmonary vasodilator therapy.  Minus Breeding  01/21/2013, 11:08 AM   Assessment / Plan:

## 2013-03-08 ENCOUNTER — Encounter: Payer: Self-pay | Admitting: Internal Medicine

## 2013-03-08 ENCOUNTER — Ambulatory Visit (INDEPENDENT_AMBULATORY_CARE_PROVIDER_SITE_OTHER): Payer: Medicaid Other | Admitting: Internal Medicine

## 2013-03-08 ENCOUNTER — Ambulatory Visit: Payer: Self-pay | Admitting: Internal Medicine

## 2013-03-08 VITALS — BP 160/97 | HR 77 | Temp 98.2°F | Ht 76.0 in | Wt 265.4 lb

## 2013-03-08 DIAGNOSIS — I509 Heart failure, unspecified: Secondary | ICD-10-CM

## 2013-03-08 DIAGNOSIS — I5023 Acute on chronic systolic (congestive) heart failure: Secondary | ICD-10-CM

## 2013-03-08 DIAGNOSIS — M109 Gout, unspecified: Secondary | ICD-10-CM

## 2013-03-08 DIAGNOSIS — D539 Nutritional anemia, unspecified: Secondary | ICD-10-CM

## 2013-03-08 LAB — BASIC METABOLIC PANEL WITH GFR
BUN: 30 mg/dL — ABNORMAL HIGH (ref 6–23)
CALCIUM: 9.3 mg/dL (ref 8.4–10.5)
CO2: 31 meq/L (ref 19–32)
CREATININE: 1.31 mg/dL (ref 0.50–1.35)
Chloride: 100 mEq/L (ref 96–112)
GFR, EST AFRICAN AMERICAN: 67 mL/min
GFR, EST NON AFRICAN AMERICAN: 58 mL/min — AB
Glucose, Bld: 163 mg/dL — ABNORMAL HIGH (ref 70–99)
Potassium: 4.3 mEq/L (ref 3.5–5.3)
SODIUM: 142 meq/L (ref 135–145)

## 2013-03-08 MED ORDER — LISINOPRIL 40 MG PO TABS: 40.0000 mg | ORAL_TABLET | Freq: Every day | ORAL | Status: DC

## 2013-03-08 MED ORDER — HYDRALAZINE HCL 25 MG PO TABS: 25.0000 mg | ORAL_TABLET | Freq: Three times a day (TID) | ORAL | Status: DC

## 2013-03-08 NOTE — Patient Instructions (Addendum)
1. Please do not start taking amlodipine. 2. Please increase your lisinopril dose to 40 mg daily 3. Please increase your hydralazine 25 mg 3 times a day. 4. If you have worsening of your symptoms or new symptoms arise, please call the clinic ([phone]), or go to the ER immediately if symptoms are severe.   Please bring in all your medication bottles with you in next visit.

## 2013-03-08 NOTE — Progress Notes (Signed)
Patient ID: [name] [name], male   DOB: 1950/08/25, 63 y.o.   MRN: [id number] Subjective:   Patient ID: [name] [name] male   DOB: 1950-11-10 63 y.o.   MRN: [id number]  CC:   Hospital followup visit.   HPI:  [name] is a 63 y.o. man with past medical history as outlined below, who presents for a hospital followup visit today.  1. CHF: Patient was recently hospitalized from 2/5 to 2/7 because of congestive heart failure exacerbation. Patient has history of congestive heart failure with EF 33-40% by 2-D echo on 01/18/13. His body weight  increased by 10 pounds on admission with elevated pro BNP 63605. Patient had negative troponin x 3 in the hospital. He was treated with IV diuretics. His body weight decreased from 275 on admission to 262 at discharge. Creatinine was initially increased from 1.3 to 1.4 and came back to 1.3 at the discharge. Patient was discharged on Coreg 25 mg twice a day, Lasix 60 mg twice a day and lisinopril 20 mg, hydralazine 10 mg 3 times a day. He was seen by cardiologist, Dr. Cathie Olden on 03/07/13, who started amlodipine 5 mg daily due to elevated bp, but he did not start this medication yet. The patient feels good. By our scale measurement, his BW was 262 at discharge and 265 LBs today. He measures his body weight every day, and states that his body weight has been stabilized at 260 LBs every day, including this morning. Patient does not have chest pain. His shortness of breath is minimal and is at his baseline.  2. HTN: Patient's blood pressure was elevated at 173/126 on admission. That was most likely due to the recent discontinuation of bidil. At discharge, his bp was 140/70. He is taking Coreg 25 mg twice a day, Lasix 60 mg twice a day, lisinopril 20 mg, hydralazine 10 mg 3 times a day. He does not have chest pain. His shortness of breath is at the baseline.  3. Right ankle gout: Patient had a right ankle swelling and tenderness. He had joint aspiration on 03/05/13 which  confirmed gout. He was given prescription for colchicine, patient did not start taking his medication yet. His symptoms have resolved. He has very minimal tenderness and swelling over his right ankle joint.  ROS:  Has bilateral leg edema, mild shortness of breath and minimal pain over his right ankle. Denies fever, chills, fatigue, headaches, cough, chest pain, abdominal pain, diarrhea, constipation, dysuria, urgency, frequency, hematuria.   Past Medical History  Diagnosis Date  . Hypertension   . CHF (congestive heart failure)     "just today" (01/18/2013)  . Asthma     'as a child"   . Shortness of breath     "comes on at anytime" (01/18/2013)  . Sickle cell trait   . Stroke 12/1988  . Arthritis     "left hip; right knee" (01/18/2013)   Current Outpatient Prescriptions  Medication Sig Dispense Refill  . acetaminophen (TYLENOL) 500 MG tablet Take 1,000 mg by mouth 2 (two) times daily as needed for moderate pain.      [name] amLODipine (NORVASC) 5 MG tablet Take 1 tablet (5 mg total) by mouth daily.  180 tablet  3  . carvedilol (COREG) 25 MG tablet Take 1 tablet (25 mg total) by mouth 2 (two) times daily with a meal.  60 tablet  3  . colchicine 0.6 MG tablet Take 1 tablet (0.6 mg total) by mouth 2 (two) times daily.  60 tablet  2  . furosemide (LASIX) 40 MG tablet Take 60 mg by mouth 2 (two) times daily.      . hydrALAZINE (APRESOLINE) 10 MG tablet Take 1 tablet (10 mg total) by mouth 3 (three) times daily.  90 tablet  1  . lisinopril (PRINIVIL,ZESTRIL) 20 MG tablet Take 1 tablet (20 mg total) by mouth daily.  30 tablet  1  . Magnesium 250 MG TABS Take 250 mg by mouth daily.      . potassium chloride SA (K-DUR,KLOR-CON) 20 MEQ tablet Take 1 tablet (20 mEq total) by mouth daily.  30 tablet  0   No current facility-administered medications for this visit.   Family History  Problem Relation Age of Onset  . Heart attack Mother   . Hypertension Mother    History   Social History  .  Marital Status: Married    Spouse Name: N/A    Number of Children: N/A  . Years of Education: N/A   Social History Main Topics  . Smoking status: Never Smoker   . Smokeless tobacco: Never Used     Comment: 01/18/2013 "quit smoking weed ~ 2005"  . Alcohol Use: No     Comment: 01/18/2013 "might have part of a drink on special occasions"  . Drug Use: Yes    Special: Marijuana  . Sexual Activity: Yes   Other Topics Concern  . Not on file   Social History Narrative  . No narrative on file    Review of Systems: Full 14-point review of systems otherwise negative. See HPI.   Objective:  Physical Exam: There were no vitals filed for this visit. Constitutional: Vital signs reviewed.  Patient is a well-developed and well-nourished, in no acute distress and cooperative with exam.   HEENT:  Head: Normocephalic and atraumatic Mouth: no erythema or exudates, MMM Eyes: PERRL, EOMI, conjunctivae normal, No scleral icterus.  Neck: Supple, Trachea midline normal ROM, No JVD  Cardiovascular: RRR, S1 normal, S2 normal, no MRG, pulses symmetric and intact bilaterally Pulmonary/Chest: CTAB, no wheezes, rales, or rhonchi Abdominal: Soft. Non-tender, non-distended, bowel sounds are normal, no masses, organomegaly, or guarding present.  GU: no CVA tenderness Musculoskeletal: No joint deformities, erythema, or stiffness, ROM full and non-tender Extremities: trace leg edema bilaterally. There is mild tenderness and minimal swelling over the right ankle joint.  Hematology: no cervical, inginal, or axillary adenopathy.  Neurological: A&O x3, Strength is normal and symmetric bilaterally, cranial nerve II-XII are grossly intact, no focal motor deficit, sensory intact to light touch bilaterally.  Skin: Warm, dry and intact. No rash, cyanosis, or clubbing.  Psychiatric: Normal mood and affect. No suicidal or homicidal ideation.  Assessment & Plan:

## 2013-03-08 NOTE — Assessment & Plan Note (Addendum)
EF 33-40% by 2-D echo on 01/18/13. After recent discharge, patient has been doing well. By our scale measurement, his BW was 262 at discharge and 265 LBs today. But the patient states that he measures his body weight every day, and says that his body weight has been stabilized at 260 LBs every day, including this morning. Patient does not have chest pain. His shortness of breath is minimal and is at his baseline. I would rather believe his own measuremen of his body weight since he uses the same scale. He is clinically euvolemic. He does not have chest pain. His shortness of breath is at his baseline.  -will continue current regimen:Coreg 25 mg twice a day, Lasix 60 mg twice a day and lisinopril, hydralazine. -will increase his lisinopril from 20 mg to 40 mg daily for better control his bp -Will increase hydralazine dosage from 10 mg to 25 mg 3 times a day.  -will not start amlodipine.

## 2013-03-08 NOTE — Assessment & Plan Note (Signed)
His right ankle pain has  resolved. There is minimal tenderness and swelling. Patient refused to start Colchicine and allopurinol. He would like to be observed from now.

## 2013-03-09 LAB — BODY FLUID CULTURE
Culture: NO GROWTH
Special Requests: NORMAL

## 2013-03-10 ENCOUNTER — Other Ambulatory Visit: Payer: Self-pay | Admitting: Nurse Practitioner

## 2013-03-11 ENCOUNTER — Ambulatory Visit: Payer: Self-pay | Admitting: Internal Medicine

## 2013-03-13 ENCOUNTER — Inpatient Hospital Stay: Payer: Self-pay | Admitting: Cardiology

## 2013-03-22 ENCOUNTER — Ambulatory Visit: Payer: Self-pay | Admitting: Internal Medicine

## 2013-03-25 ENCOUNTER — Encounter: Payer: Self-pay | Admitting: Internal Medicine

## 2013-03-25 ENCOUNTER — Ambulatory Visit (INDEPENDENT_AMBULATORY_CARE_PROVIDER_SITE_OTHER): Payer: Medicaid Other | Admitting: Internal Medicine

## 2013-03-25 ENCOUNTER — Ambulatory Visit: Payer: Self-pay | Admitting: Internal Medicine

## 2013-03-25 VITALS — BP 131/79 | HR 80 | Temp 97.6°F | Ht 76.0 in | Wt 266.9 lb

## 2013-03-25 DIAGNOSIS — L259 Unspecified contact dermatitis, unspecified cause: Secondary | ICD-10-CM

## 2013-03-25 DIAGNOSIS — I5022 Chronic systolic (congestive) heart failure: Secondary | ICD-10-CM

## 2013-03-25 DIAGNOSIS — I10 Essential (primary) hypertension: Secondary | ICD-10-CM

## 2013-03-25 DIAGNOSIS — I509 Heart failure, unspecified: Secondary | ICD-10-CM

## 2013-03-25 DIAGNOSIS — L309 Dermatitis, unspecified: Secondary | ICD-10-CM

## 2013-03-25 DIAGNOSIS — I5042 Chronic combined systolic (congestive) and diastolic (congestive) heart failure: Secondary | ICD-10-CM | POA: Insufficient documentation

## 2013-03-25 MED ORDER — ISOSORB DINITRATE-HYDRALAZINE 20-37.5 MG PO TABS: 2.0000 | ORAL_TABLET | Freq: Two times a day (BID) | ORAL | Status: DC

## 2013-03-25 NOTE — Assessment & Plan Note (Addendum)
EF 35-40% on ECHO, f/u by Dr. Acie Fredrickson, cont Coreg 25 mg bid, Lasix 60 mg bid, hydralazine 25 mh bid, bidil 40-75.  Will not increase hydralazine further since pt requests to stay on bidil with has hydralazine in it (pt taking equivalent of 125 mg hydralazine daily.

## 2013-03-25 NOTE — Patient Instructions (Signed)
You may resume the Bidil. We will schedule you with the financial counselor today. Follow-up with your PCP.

## 2013-03-25 NOTE — Progress Notes (Signed)
Case discussed with Dr. Schooler at the time of the visit.  We reviewed the resident's history and exam and pertinent patient test results.  I agree with the assessment, diagnosis, and plan of care documented in the resident's note.     

## 2013-03-25 NOTE — Progress Notes (Signed)
   Subjective:    Patient ID: [name] [name], male    DOB: [birth year], 63 y.o.   MRN: [id number]  HPI  [name] presents today for f/u of hypertension after increase in lisinopril to 40 mg qd, increase hydralazine to 25 mg tid and d/c of amlodipine.  Pt states that his blood pressure was still not controlled without Bidil and subsequently continued the bidil 2 tabs (20-37.5) bid.  His pressure today is controlled at 131/79 with pulse 80 bpm.  He states that Bidil was changed secondary to monetary constraints but now would rather use all means to stay on Bidil as it controlled his bp well.  He is requesting to see the finanical counselor for assistance with filling out the paper work for the "[employer]  He also expressed concern that he would be seeing Dr. Duwaine [name] as his PCP but has not seen her since recent admission for acute exac of CHF aortic stenosis for his CHF, he states that he is without sob unless he walks uphill during his job. Denies chest pain, nocturnal paroxysymal dyspnea, still have some LE swelling.   Review of Systems  Constitutional: Negative.   HENT: Negative.   Respiratory: Positive for shortness of breath. Negative for chest tightness.        DOE when ambulating on incline  Cardiovascular: Positive for leg swelling. Negative for chest pain.  Gastrointestinal: Negative for nausea, vomiting, abdominal pain, diarrhea and constipation.  Endocrine: Negative.   Genitourinary: Negative for dysuria.  Skin: Positive for rash.       + eczema for which he uses hydrocortisone Mildly pruritic scattered hives across anterior chest.  Neurological: Negative for weakness and headaches.  Hematological: Negative.   Psychiatric/Behavioral: Negative.        Objective:   Physical Exam  Constitutional: He is oriented to person, place, and time. He appears well-developed and well-nourished. No distress.  HENT:  Head: Normocephalic and atraumatic.  Eyes: Conjunctivae and EOM are  normal. Pupils are equal, round, and reactive to light.  Neck: Normal range of motion. Neck supple. No thyromegaly present.  Cardiovascular: Normal rate, regular rhythm, normal heart sounds and intact distal pulses.   Pulmonary/Chest: Effort normal and breath sounds normal. No respiratory distress. He has no wheezes. He has no rales.  Abdominal: Soft. Bowel sounds are normal. There is no tenderness.  Musculoskeletal: He exhibits edema and tenderness.  LE 1+ pitting edema Compression hose in place  Neurological: He is alert and oriented to person, place, and time.  Skin: Skin is warm and dry. Rash noted.  Psychiatric: He has a normal mood and affect.          Assessment & Plan:  See detailed problem list charting:

## 2013-03-25 NOTE — Assessment & Plan Note (Signed)
Using 1% hydrocortisone. Advised to also apply to rash on anterior chest.

## 2013-03-25 NOTE — Assessment & Plan Note (Addendum)
BP Readings from Last 3 Encounters:  03/25/13 131/79  03/08/13 160/97  03/07/13 150/110    Lab Results  Component Value Date   NA 142 03/08/2013   K 4.3 03/08/2013   CREATININE 1.31 03/08/2013    Assessment: Blood pressure control: controlled Progress toward BP goal:  at goal Comments: pt prefers to resume bidil which he has already started taking again  Plan: Medications:  continue current medications Educational resources provided: brochure;handout;video Self management tools provided:   Other plans: cont coreg 25 mg bid, bidil, lisinopril 40 mg qd, hydralazine 25mg  tid (150 mg qd total given that bidil dosage provides 75 mg, lasix 60 mg bid

## 2013-03-26 ENCOUNTER — Telehealth: Payer: Self-pay | Admitting: Cardiovascular Disease

## 2013-03-26 ENCOUNTER — Encounter: Payer: Self-pay | Admitting: *Deleted

## 2013-03-26 NOTE — Progress Notes (Signed)
Case discussed with Dr. Niu soon after the resident saw the patient.  We reviewed the resident's history and exam and pertinent patient test results.  I agree with the assessment, diagnosis, and plan of care documented in the resident's note. 

## 2013-03-26 NOTE — Telephone Encounter (Signed)
New message         Pt needs records of his heart failure for work.

## 2013-03-26 NOTE — Telephone Encounter (Signed)
LETTER WRITTEN FOR WORK// SEE LETTER.

## 2013-03-27 NOTE — Telephone Encounter (Signed)
Follow up     Patient calling to follow up on letter for his job.

## 2013-03-27 NOTE — Telephone Encounter (Signed)
Pt informed letter was faxed.

## 2013-04-05 ENCOUNTER — Ambulatory Visit (INDEPENDENT_AMBULATORY_CARE_PROVIDER_SITE_OTHER): Payer: Medicaid Other | Admitting: Nurse Practitioner

## 2013-04-05 ENCOUNTER — Encounter: Payer: Self-pay | Admitting: Nurse Practitioner

## 2013-04-05 VITALS — BP 140/80 | HR 86 | Ht 76.0 in | Wt 269.0 lb

## 2013-04-05 DIAGNOSIS — I5022 Chronic systolic (congestive) heart failure: Secondary | ICD-10-CM

## 2013-04-05 DIAGNOSIS — I10 Essential (primary) hypertension: Secondary | ICD-10-CM

## 2013-04-05 LAB — BASIC METABOLIC PANEL
BUN: 22 mg/dL (ref 6–23)
CO2: 32 mEq/L (ref 19–32)
Calcium: 9.2 mg/dL (ref 8.4–10.5)
Chloride: 104 mEq/L (ref 96–112)
Creatinine, Ser: 1.4 mg/dL (ref 0.4–1.5)
GFR: 69.07 mL/min (ref >60.00)
Glucose, Bld: 157 mg/dL — ABNORMAL HIGH (ref 70–99)
Potassium: 3.6 mEq/L (ref 3.5–5.1)
Sodium: 142 mEq/L (ref 135–145)

## 2013-04-05 MED ORDER — SPIRONOLACTONE 25 MG PO TABS: 25.0000 mg | ORAL_TABLET | Freq: Every day | ORAL | Status: DC

## 2013-04-05 NOTE — Progress Notes (Signed)
[name] [name] Date of Birth: 08-Nov-1950 Medical Record #[id number]  History of Present Illness: [name] [name] is seen back today for a 4 week visit. Seen for Dr. Acie Fredrickson. He has had longstanding HTN, asthma, and prior CVA. Originally from the [place].   Presented earlier this year with increased SOB, DOE and orthopnea for 3 weeks - he was found to be hypertensive, using NSAID, etc. Had been taking some Lasix that he was prescribed while living in [place]. He was cathed - had mild CAD with elevated right sided pressures. Bidil added along with ACE. Renal function improved. EF was 35 to 40%.   Seen several times since his original admission. Medicines quite mixed up. Not felt to be able to afford his Bidil. Called several times with weight gain.  Readmitted last month with worsening heart failure - did have a joint aspiration positive for gout crystals. Was not able to afford colchicine.   Saw Dr. Acie Fredrickson last month - hydralazine 10 mg added - amlodipine was added.   Comes back today. Here alone. Medicines are quite confusing once again - looks like he has been back to his PCP. The Norvasc was never started. Hydralazine increased to 25 mg TID then had his Bidil added back. He tells me he is NOT taking the plain hydralazine. Has already ran out of his Bidil - will not be able to fill until next Friday.  Wants to try and stay on but we have had this conversation before and he is more than likely not going to be able to afford long term. Not able to afford any brand name drug. Weight has been stable. He feels pretty good. Still with some swelling. Restricting his salt.   Current Outpatient Prescriptions  Medication Sig Dispense Refill  . acetaminophen (TYLENOL) 500 MG tablet Take 1,000 mg by mouth 2 (two) times daily as needed for moderate pain.      . carvedilol (COREG) 25 MG tablet Take 1 tablet (25 mg total) by mouth 2 (two) times daily with a meal.  60 tablet  3  . furosemide (LASIX) 40 MG  tablet Take 60 mg by mouth 2 (two) times daily.      . hydrALAZINE (APRESOLINE) 25 MG tablet Take 1 tablet (25 mg total) by mouth 3 (three) times daily.  90 tablet  1  . hydrocortisone cream 1 % Apply 1 application topically as needed for itching.      . isosorbide-hydrALAZINE (BIDIL) 20-37.5 MG per tablet Take 2 tablets by mouth 2 (two) times daily.  60 tablet  3  . lisinopril (PRINIVIL,ZESTRIL) 40 MG tablet Take 1 tablet (40 mg total) by mouth daily.  30 tablet  1  . Magnesium 250 MG TABS Take 250 mg by mouth daily.      . potassium chloride SA (K-DUR,KLOR-CON) 20 MEQ tablet TAKE 1 TABLET BY MOUTH EVERY DAY  30 tablet  2   No current facility-administered medications for this visit.    No Known Allergies  Past Medical History  Diagnosis Date  . Hypertension   . Systolic heart failure     EF is 35 to 40%  . Asthma     'as a child"   . Shortness of breath     "comes on at anytime" (01/18/2013)  . Sickle cell trait   . Stroke 12/1988  . Arthritis     "left hip; right knee" (01/18/2013)  . Gout     Past Surgical History  Procedure Laterality Date  .  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 Hernia repair  2004?    "above the navel" (01/18/2013)  . Cardiac catheterization  12/1988    History  Smoking status  . Never Smoker   Smokeless tobacco  . Never Used    Comment: 01/18/2013 "quit smoking weed ~ 2005"    History  Alcohol Use No    Comment: 01/18/2013 "might have part of a drink on special occasions"    Family History  Problem Relation Age of Onset  . Heart attack Mother   . Hypertension Mother     Review of Systems: The review of systems is per the HPI.  All other systems were reviewed and are negative.  Physical Exam: BP 140/80  Pulse 86  Ht 6\' 4"  (1.93 m)  Wt 269 lb (122.018 kg)  BMI 32.76 kg/m2  SpO2 98% Patient is very pleasant and in no acute distress. He is of large stature. Skin is warm and dry. Color is normal.  HEENT is unremarkable.  Normocephalic/atraumatic. PERRL. Sclera are nonicteric. Neck is supple. No masses. No JVD. Lungs are clear. Cardiac exam shows a regular rate and rhythm. Occasional ectopic. Abdomen is soft. Extremities are with trace edema. Gait and ROM are intact. No gross neurologic deficits noted.  Wt Readings from Last 3 Encounters:  04/05/13 269 lb (122.018 kg)  03/25/13 266 lb 14.4 oz (121.065 kg)  03/08/13 265 lb 6.4 oz (120.385 kg)   LABORATORY DATA: PENDING  Lab Results  Component Value Date   WBC 8.9 02/28/2013   HGB 12.7* 02/28/2013   HCT 36.9* 02/28/2013   PLT 232 02/28/2013   GLUCOSE 163* 03/08/2013   NA 142 03/08/2013   K 4.3 03/08/2013   CL 100 03/08/2013   CREATININE 1.31 03/08/2013   BUN 30* 03/08/2013   CO2 31 03/08/2013   TSH 1.124 01/18/2013   INR 1.12 01/21/2013   Echo Study Conclusions from December 2014  - Left ventricle: The cavity size was moderately dilated. Wall thickness was increased in a pattern of moderate LVH. Systolic function was moderately reduced. The estimated ejection fraction was in the range of 35% to 40%. Diffuse hypokinesis. Regional wall motion abnormalities cannot be excluded. The study is not technically sufficient to allow evaluation of LV diastolic function. - Aortic valve: Mild regurgitation. - Mitral valve: Mild regurgitation. - Left atrium: The atrium was mildly dilated. - Right ventricle: The cavity size was mildly dilated. - Right atrium: The atrium was mildly dilated. - Tricuspid valve: Moderate regurgitation. - Pulmonic valve: Moderate regurgitation. - Pulmonary arteries: PA peak pressure: 75mm Hg (S).  Coronary angiography:  Coronary dominance: right  Left mainstem: Long and normal.  Left anterior descending (LAD): Normal. D1 large and normal.  Left circumflex (LCx): AV groove normal. OM large and normal.  Right coronary artery (RCA): Very large. Mild diffuse luminal irregularities. Long mid 40% stenosis. AM very large with PDA. Mild diffuse  luminal irregularities. PL large nd normal  Left ventriculography: Left ventricle was crossed for pressures but I did not do an LV gram secondary to elevated end diastolic pressure and CKD  Final Conclusions: Mild coronary plaque. Elevated right sided pressures.  Recommendations: Elevated right sided pressures appear to be related in part to systolic and diastolic heart failure with elevated LVEDP. However, the elevated right sided pressures were somewhat out of proportion to the EDP elevation. If he does not improve with usual treatment and lifestyle changes for acute on chronic systolic  and diastolic HF I would consider pulmonary vasodilator therapy.  [name] [name]  01/21/2013, 11:08 AM    Assessment / Plan:  1. Systolic HF - EF of [id number] - probably from long standing HTN - he is more than likely NOT going to be able to afford any brand name drug. We were able to give him samples today (since he is not able to fill the RX for one more week due to $) and I suspect we will need to split this back up on return. I am stopping his potassium, adding aldactone 25 mg. Check BMET today and again in one week. He is seeing Dr. Acie Fredrickson in April and I will see him back in May.  2. HTN - BP fair - still have room to add medicines and Aldactone is added today.  3. CKD - recheck his labs today. Creatinine was normal at last check.   4. Left ankle pain - s/p aspiration of gout. He would like to go back and see Dr. Percell Miller for his general orthopedic care.   Patient is agreeable to this plan and will call if any problems develop in the interim.   Burtis Junes, RN, [place]  [address] [place]  [place], [place] [address]  [phone]

## 2013-04-05 NOTE — Patient Instructions (Signed)
Dr. Edmonia Lynch was the orthopedist that saw you in the hospital - He is at "[facility]"  Stop your potassium  I am adding aldactone 25 mg to take each day - this is at the drug store  We will check lab today. We will recheck it in one week  Keep restricting your salt and weighing each day  See Dr. Acie Fredrickson next month  I will see you in 2 months  Call the [place] office at [phone] if you have any questions, problems or concerns.

## 2013-04-12 ENCOUNTER — Other Ambulatory Visit: Payer: Self-pay

## 2013-04-12 ENCOUNTER — Other Ambulatory Visit (INDEPENDENT_AMBULATORY_CARE_PROVIDER_SITE_OTHER): Payer: Medicaid Other

## 2013-04-12 DIAGNOSIS — I10 Essential (primary) hypertension: Secondary | ICD-10-CM

## 2013-04-12 DIAGNOSIS — I5022 Chronic systolic (congestive) heart failure: Secondary | ICD-10-CM

## 2013-04-12 DIAGNOSIS — I509 Heart failure, unspecified: Secondary | ICD-10-CM

## 2013-04-12 LAB — BASIC METABOLIC PANEL
BUN: 18 mg/dL (ref 6–23)
CO2: 30 mEq/L (ref 19–32)
Calcium: 9.4 mg/dL (ref 8.4–10.5)
Chloride: 105 mEq/L (ref 96–112)
Creatinine, Ser: 1.3 mg/dL (ref 0.4–1.5)
GFR: 72.14 mL/min (ref >60.00)
Glucose, Bld: 112 mg/dL — ABNORMAL HIGH (ref 70–99)
Potassium: 3.8 mEq/L (ref 3.5–5.1)
Sodium: 142 mEq/L (ref 135–145)

## 2013-04-12 MED ORDER — LISINOPRIL 40 MG PO TABS: 40.0000 mg | ORAL_TABLET | Freq: Every day | ORAL | Status: DC

## 2013-04-15 ENCOUNTER — Telehealth: Payer: Self-pay | Admitting: Cardiovascular Disease

## 2013-04-15 ENCOUNTER — Telehealth: Payer: Self-pay | Admitting: *Deleted

## 2013-04-15 NOTE — Telephone Encounter (Signed)
lmtcb for lab results. number provided 

## 2013-04-15 NOTE — Telephone Encounter (Signed)
Follow UP:  Pt is returning a call to [facility].

## 2013-04-18 ENCOUNTER — Telehealth: Payer: Self-pay | Admitting: Licensed Clinical Social Worker

## 2013-04-18 NOTE — Telephone Encounter (Signed)
CSW will stress for [name] [name] to complete his [facility] card application and sign up for MAP, as chart notes indicate he may not be taking his medications.

## 2013-04-18 NOTE — Telephone Encounter (Signed)
Mr. Demaria placed call to CSW regarding disability application.  CSW returned call to Mr. Bransfield.  Pt states he has previously filed and was denied.  Pt is in the middle of appealing.  Mr. Levingston is scheduled for [facility] appt on 3/27 morning appt.  CSW informed Mr. Blau, CSW will meet with him in person to provide agency resources that assist with disability appeals.  CSW will meet with Mr. Sanguinetti on 04/19/13

## 2013-04-19 ENCOUNTER — Telehealth: Payer: Self-pay | Admitting: Nurse Practitioner

## 2013-04-19 ENCOUNTER — Encounter: Payer: Self-pay | Admitting: Internal Medicine

## 2013-04-19 ENCOUNTER — Ambulatory Visit (INDEPENDENT_AMBULATORY_CARE_PROVIDER_SITE_OTHER): Payer: Medicaid Other | Admitting: Internal Medicine

## 2013-04-19 ENCOUNTER — Encounter: Payer: Self-pay | Admitting: Licensed Clinical Social Worker

## 2013-04-19 VITALS — BP 142/76 | HR 78 | Temp 98.8°F | Ht 76.0 in | Wt 267.0 lb

## 2013-04-19 DIAGNOSIS — I10 Essential (primary) hypertension: Secondary | ICD-10-CM

## 2013-04-19 DIAGNOSIS — M109 Gout, unspecified: Secondary | ICD-10-CM

## 2013-04-19 MED ORDER — PREDNISONE 5 MG PO TABS: ORAL_TABLET | ORAL | Status: DC

## 2013-04-19 NOTE — Telephone Encounter (Signed)
New message     Pt is requesting to talk to [place] about he thinks his heart condition is causing his legs to be weak were he cannot walk???????

## 2013-04-19 NOTE — Telephone Encounter (Signed)
S/w pt going to the hospital right leg and ankle flared up with gout pt stated thinks injured hip due to the leg and ankle will send to [facility] for FYI

## 2013-04-19 NOTE — Assessment & Plan Note (Signed)
2nd flare in two months, possibly precipitated by additional diuretic for CHF - Kenalog injection to right medial tibio-talar joint space followed by prednisone taper --> much improvement in pain, pt able to bear weight subsequently -advised to use cane as there are no crutches available in the clinic -information for colchicine drug assistance program provided -consider allopurinol tx after resolution of flare

## 2013-04-19 NOTE — Progress Notes (Signed)
   Subjective:    Patient ID: [name] [name], male    DOB: [birth year], 63 y.o.   MRN: [id number]  HPI  63 yo with hx significant for gout, chronic systolic heart failure, hypertension, and CKD Stage 2 who presents with complaints of right ankle pain x 2 days which he believes is a gout flare.  States that he is unable to bear weight secondary to pain. Recently had spironolactone added to his CHF regimen ~ 2 weeks ago.  Review of Systems  Constitutional: Negative for fever and fatigue.  Eyes: Negative.   Respiratory: Negative for cough and shortness of breath.   Cardiovascular: Negative for chest pain.  Gastrointestinal: Negative.   Genitourinary: Negative.   Musculoskeletal: Positive for arthralgias and joint swelling.  Skin: Negative for rash.  Neurological: Negative.         Objective:   Physical Exam  Constitutional: He is oriented to person, place, and time. He appears well-developed and well-nourished. No distress.  In clinic wheelchair, son states he uses can at home but left it in the car.  HENT:  Head: Normocephalic and atraumatic.  Eyes: Conjunctivae and EOM are normal. Pupils are equal, round, and reactive to light.  Cardiovascular: Normal rate.   Pulmonary/Chest: Effort normal.  Musculoskeletal: He exhibits edema and tenderness.       Right ankle: He exhibits swelling. He exhibits normal range of motion, no ecchymosis and normal pulse. Tenderness. No lateral malleolus and no medial malleolus tenderness found. Achilles tendon normal.  Neurological: He is alert and oriented to person, place, and time.  Skin: Skin is warm and dry. No rash noted. No erythema.  Psychiatric: He has a normal mood and affect.          Assessment & Plan:  See separate problem-list charting for detailed A/P:  1. Acute gout:  - Kenalog injection to right medial tibio-talar joint space followed by prednisone taper  -advised to use cane  -information for colchicine drug assistance program  provided -consider allopurinol tx after resolution of flare   Name: [name] [name] MRN: [id number] DOB: 09/25/1950  DOS: 04/19/2013  PROCEDURE NOTE  Procedure:  Intra-articular corticosteroid injection  Indications:  Analgesia of acute gout  Consent:  Consent was obtained from patient with witness  Procedure summary:  The patient was identified as [name] [name] and safety timeout was performed.  Sterile technique was used. With ankle in neutral position, injection site was marked just above the talus and medial to tibialis anterior ligament. The patient's right ankle was prepped using chlorhexidine and the field was draped in usual sterile fashion. 1.7 ml of 1:1 mixture of Kenolog and 1% lidocaine was injected into the joint space (~35mg ).   Complications:  No immediate complications were noted.  Estimated blood loss:  none  Dorian Heckle, MD Internal Medicine Resident PGY-3  04/19/2013, 12:25 PM

## 2013-04-19 NOTE — Assessment & Plan Note (Signed)
BP Readings from Last 3 Encounters:  04/19/13 142/76  04/05/13 140/80  03/25/13 131/79    Lab Results  Component Value Date   NA 142 04/12/2013   K 3.8 04/12/2013   CREATININE 1.3 04/12/2013    Assessment: Blood pressure control: controlled Progress toward BP goal:  at goal Comments:   Plan: Medications:  continue current medications Educational resources provided: brochure Self management tools provided: home blood pressure logbook Other plans: cont carvedilol 25 mg bid, lasix 40 mg bid, bidil 20-37.5, lisinopril 40 mg qd, and spironolactone 25 mg qd

## 2013-04-19 NOTE — Telephone Encounter (Signed)
Noted  

## 2013-04-19 NOTE — Patient Instructions (Addendum)
General Instructions: We have given you a steroid injection in your ankle today. Please look over the colchicine assistance information from the drug company.  For your gout take Prednisone: Take 8 pills (40 mg) at a time x 2 days THEN 6 pills (30 mg) at a time x2 days THEN 4 (20 mg) pills at a time x 2 days THEN 2 pills (10 mg) at a time x 2 days THEN 1 pill (10 mg) x 2 days THEN STOP Thank you for bringing your medicines today. This helps [place] keep you safe from mistakes.  Treatment Goals:  Goals (1 Years of Data) as of 04/19/13         As of Today 04/05/13 03/25/13 03/08/13 03/08/13     Blood Pressure    . Blood Pressure < 150/90  142/76 140/80 131/79 160/97 188/120      Progress Toward Treatment Goals:  Treatment Goal 04/19/2013  Blood pressure at goal    Self Care Goals & Plans:  Self Care Goal 04/19/2013  Manage my medications take my medicines as prescribed; bring my medications to every visit; refill my medications on time  Monitor my health -  Eat healthy foods drink diet soda or water instead of juice or soda; eat more vegetables; eat foods that are low in salt; eat baked foods instead of fried foods; eat fruit for snacks and desserts  Be physically active -    No flowsheet data found.   Care Management & Community Referrals:  Referral 03/25/2013  Referrals made for care management support financial counselor    Gout Gout is an inflammatory arthritis caused by a buildup of uric acid crystals in the joints. Uric acid is a chemical that is normally present in the blood. When the level of uric acid in the blood is too high it can form crystals that deposit in your joints and tissues. This causes joint redness, soreness, and swelling (inflammation). Repeat attacks are common. Over time, uric acid crystals can form into masses (tophi) near a joint, destroying bone and causing disfigurement. Gout is treatable and often preventable. CAUSES  The disease begins with elevated levels of  uric acid in the blood. Uric acid is produced by your body when it breaks down a naturally found substance called purines. Certain foods you eat, such as meats and fish, contain high amounts of purines. Causes of an elevated uric acid level include:  Being passed down from parent to child (heredity).  Diseases that cause increased uric acid production (such as obesity, psoriasis, and certain cancers).  Excessive alcohol use.  Diet, especially diets rich in meat and seafood.  Medicines, including certain cancer-fighting medicines (chemotherapy), water pills (diuretics), and aspirin.  Chronic kidney disease. The kidneys are no longer able to remove uric acid well.  Problems with metabolism. Conditions strongly associated with gout include:  Obesity.  High blood pressure.  High cholesterol.  Diabetes. Not everyone with elevated uric acid levels gets gout. It is not understood why some people get gout and others do not. Surgery, joint injury, and eating too much of certain foods are some of the factors that can lead to gout attacks. SYMPTOMS   An attack of gout comes on quickly. It causes intense pain with redness, swelling, and warmth in a joint.  Fever can occur.  Often, only one joint is involved. Certain joints are more commonly involved:  Base of the big toe.  Knee.  Ankle.  Wrist.  Finger. Without treatment, an attack usually  goes away in a few days to weeks. Between attacks, you usually will not have symptoms, which is different from many other forms of arthritis. DIAGNOSIS  Your caregiver will suspect gout based on your symptoms and exam. In some cases, tests may be recommended. The tests may include:  Blood tests.  Urine tests.  X-rays.  Joint fluid exam. This exam requires a needle to remove fluid from the joint (arthrocentesis). Using a microscope, gout is confirmed when uric acid crystals are seen in the joint fluid. TREATMENT  There are two phases to  gout treatment: treating the sudden onset (acute) attack and preventing attacks (prophylaxis).  Treatment of an Acute Attack.  Medicines are used. These include anti-inflammatory medicines or steroid medicines.  An injection of steroid medicine into the affected joint is sometimes necessary.  The painful joint is rested. Movement can worsen the arthritis.  You may use warm or cold treatments on painful joints, depending which works best for you.  Treatment to Prevent Attacks.  If you suffer from frequent gout attacks, your caregiver may advise preventive medicine. These medicines are started after the acute attack subsides. These medicines either help your kidneys eliminate uric acid from your body or decrease your uric acid production. You may need to stay on these medicines for a very long time.  The early phase of treatment with preventive medicine can be associated with an increase in acute gout attacks. For this reason, during the first few months of treatment, your caregiver may also advise you to take medicines usually used for acute gout treatment. Be sure you understand your caregiver's directions. Your caregiver may make several adjustments to your medicine dose before these medicines are effective.  Discuss dietary treatment with your caregiver or dietitian. Alcohol and drinks high in sugar and fructose and foods such as meat, poultry, and seafood can increase uric acid levels. Your caregiver or dietician can advise you on drinks and foods that should be limited. HOME CARE INSTRUCTIONS   Do not take aspirin to relieve pain. This raises uric acid levels.  Only take over-the-counter or prescription medicines for pain, discomfort, or fever as directed by your caregiver.  Rest the joint as much as possible. When in bed, keep sheets and blankets off painful areas.  Keep the affected joint raised (elevated).  Apply warm or cold treatments to painful joints. Use of warm or cold  treatments depends on which works best for you.  Use crutches if the painful joint is in your leg.  Drink enough fluids to keep your urine clear or pale yellow. This helps your body get rid of uric acid. Limit alcohol, sugary drinks, and fructose drinks.  Follow your dietary instructions. Pay careful attention to the amount of protein you eat. Your daily diet should emphasize fruits, vegetables, whole grains, and fat-free or low-fat milk products. Discuss the use of coffee, vitamin C, and cherries with your caregiver or dietician. These may be helpful in lowering uric acid levels.  Maintain a healthy body weight. SEEK MEDICAL CARE IF:   You develop diarrhea, vomiting, or any side effects from medicines.  You do not feel better in 24 hours, or you are getting worse. SEEK IMMEDIATE MEDICAL CARE IF:   Your joint becomes suddenly more tender, and you have chills or a fever. MAKE SURE YOU:   Understand these instructions.  Will watch your condition.  Will get help right away if you are not doing well or get worse. Document Released: 01/08/2000 Document Revised:  05/07/2012 Document Reviewed: 08/24/2011 ExitCare Patient Information 2014 [place], [place].

## 2013-04-22 NOTE — Progress Notes (Signed)
Case discussed with Dr. Schooler soon after the resident saw the patient.  We reviewed the resident's history and exam and pertinent patient test results.  I agree with the assessment, diagnosis, and plan of care documented in the resident's note. 

## 2013-04-24 ENCOUNTER — Telehealth: Payer: Self-pay | Admitting: Licensed Clinical Social Worker

## 2013-04-24 NOTE — Telephone Encounter (Signed)
CSW faxed referral form [employer] per pt's request.  Pt provided all necessary information regarding income.

## 2013-04-24 NOTE — Progress Notes (Signed)
CSW met with [name] during his scheduled [facility] appointment.  Pt states he has applied for disability in the past and was denied, currently attempting appeal but in need of assistance.  Pt states he has contacted an Agency: [employer]  CSW discussed non-profit agency, [employer] that also assist with Programmer, multimedia.  [name] would like to complete referral to [facility].  CSW provided form, pt completed and will call back with income information for referral.  CSW will fax referral form once all information is completed.  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  However, since moving to the [place] in 2003, pt has been unable to work.  Pt completed his schooling in 2003-2006 and has yet to find employment in computers.  [name] states he health problems started shortly after he moved to the [place] and finished school.  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  CSW encouraged [name] to complete his [place] application as it would open up more medical resources for him.  Pt states he has been unable to bear weight and is in need of a wheelchair.  CSW encouraged pt to check with [employer] locations for used crutches/wheelchair.  Pt states he is in so much pain at home that he has been crawling around the house.  CSW inquired if he discussed with physician, pt states he had not.  CSW informed nursing staff and pt was further assessed.

## 2013-05-02 ENCOUNTER — Other Ambulatory Visit: Payer: Self-pay | Admitting: *Deleted

## 2013-05-02 MED ORDER — SPIRONOLACTONE 25 MG PO TABS: 25.0000 mg | ORAL_TABLET | Freq: Every day | ORAL | Status: DC

## 2013-05-06 ENCOUNTER — Other Ambulatory Visit: Payer: Self-pay | Admitting: *Deleted

## 2013-05-06 ENCOUNTER — Telehealth: Payer: Self-pay | Admitting: Licensed Clinical Social Worker

## 2013-05-06 MED ORDER — FUROSEMIDE 40 MG PO TABS: 60.0000 mg | ORAL_TABLET | Freq: Two times a day (BID) | ORAL | Status: DC

## 2013-05-06 NOTE — Telephone Encounter (Signed)
CSW returning call to Mr. Barnaby as pt had contact CSW last week and provided name and number of Financial counselor for CSW to contact.  CSW was unsure at this time as to why pt requesting CSW to contact financial counselor, however, CSW placed call and left message requesting return call to either CSW or directly to pt.  CSW placed call to Mr. Pugh to notify call was made and if [facility] could provide further assistance.

## 2013-05-08 ENCOUNTER — Ambulatory Visit (INDEPENDENT_AMBULATORY_CARE_PROVIDER_SITE_OTHER): Payer: Medicaid Other | Admitting: Cardiovascular Disease

## 2013-05-08 ENCOUNTER — Encounter: Payer: Self-pay | Admitting: Cardiovascular Disease

## 2013-05-08 VITALS — BP 150/100 | HR 80 | Ht 76.0 in | Wt 262.0 lb

## 2013-05-08 DIAGNOSIS — I5022 Chronic systolic (congestive) heart failure: Secondary | ICD-10-CM

## 2013-05-08 DIAGNOSIS — I509 Heart failure, unspecified: Secondary | ICD-10-CM

## 2013-05-08 DIAGNOSIS — I10 Essential (primary) hypertension: Secondary | ICD-10-CM

## 2013-05-08 MED ORDER — ISOSORB DINITRATE-HYDRALAZINE 20-37.5 MG PO TABS: 2.0000 | ORAL_TABLET | Freq: Three times a day (TID) | ORAL | Status: DC

## 2013-05-08 NOTE — Assessment & Plan Note (Addendum)
He seems to be stable. She's not having any symptoms of congestive heart failure. His blood pressure remains a little elevated. We will increase his BiDil to TID. He's scheduled to see [name] next month. I'll see him again in 3 months following an echocardiogram.    Informed today that he likely needs to have a hip replacement. I think that he would be at low risk for his hip surgery. I've encouraged him to go ahead and called the orthopedic surgeons.

## 2013-05-08 NOTE — Progress Notes (Signed)
[name] [name] Date of Birth: March 15, 1950 Medical Record #[id number]  Problem List: 1. Hypertension 2.  Chronic systolic Congestive heart failure - EF 35-44% 3. History of stroke 4. Gout  History of Present Illness: [name] [name] is seen back today for a post hospital/TOC visit.  [name] He has had longstanding HTN, asthma, and prior CVA. Originally from the [place].   Most recently presented with increased SOB, DOE and orthopnea for 3 weeks - he was found to be hypertensive, using NSAID, etc. Had been taking some Lasix that he was prescribed while living in [place]. He was cathed - had mild CAD with elevated right sided pressures. Bidil added along with ACE. Renal function improved. EF was 35 to 40%.   Comes back today. Here alone. Medicines are all mixed up - on both OTC potassium and prescription and only taking the OTC agent. He is not taking Mobic (which was left on his discharge meds). Has not had his medicines today. His car had to be towed and so he did not eat and thus did not take his medicines. He notes that his breathing is much better. Weight is stable at home. His swelling has improved but not totally resolved. Some ankle pain. Has chronic hip pain and wanted to have THR in the future. No chest pain. Avoiding salt. Does not drink alcohol. Trying to get on Medicaid. Works part time in [place] and needs to return to work so he can buy his medicines. Some headache and he thought it was due to his medicine. He does not have a primary care doctor.   Feb. 12, 2015:  He is seen back for a visit.  His BP has remained elevated.  He was readmitted to the hospital with shortness of breath. He was aggressively diuresed but then developed gout. He had documented uric acid crystals seen on a joint aspiration.  He is not able to avoid the colchicine.  His blood pressure remains elevated.   Avoids salt - canned food, bacon, sausage.  May 08, 2013;  His BP has been ok at home.  He is avoiding  salt.  He had a severe gouty flare up .  He was prescribed colchicine but could not afford it.   He is hoping that he can afford it now that he is on Medicaid.   Also has lots of hip pain.  Has seen a chiropractor.   He likely needs to have a hip replacement.   Current Outpatient Prescriptions  Medication Sig Dispense Refill  . acetaminophen (TYLENOL) 500 MG tablet Take 1,000 mg by mouth 2 (two) times daily as needed for moderate pain.      . carvedilol (COREG) 25 MG tablet Take 1 tablet (25 mg total) by mouth 2 (two) times daily with a meal.  60 tablet  3  . furosemide (LASIX) 40 MG tablet Take 1.5 tablets (60 mg total) by mouth 2 (two) times daily.  90 tablet  1  . hydrocortisone cream 1 % Apply 1 application topically as needed for itching.      . isosorbide-hydrALAZINE (BIDIL) 20-37.5 MG per tablet Take 2 tablets by mouth 2 (two) times daily.  60 tablet  3  . lisinopril (PRINIVIL,ZESTRIL) 40 MG tablet Take 1 tablet (40 mg total) by mouth daily.  30 tablet  10  . Magnesium 250 MG TABS Take 250 mg by mouth daily.      [name] spironolactone (ALDACTONE) 25 MG tablet Take 1 tablet (25 mg total) by mouth  daily.  30 tablet  3   No current facility-administered medications for this visit.    No Known Allergies  Past Medical History  Diagnosis Date  . Hypertension   . Systolic heart failure     EF is 35 to 40%  . Asthma     'as a child"   . Shortness of breath     "comes on at anytime" (01/18/2013)  . Sickle cell trait   . Stroke 12/1988  . Arthritis     "left hip; right knee" (01/18/2013)  . Gout     Past Surgical History  Procedure Laterality Date  . 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 Hernia repair  2004?    "above the navel" (01/18/2013)  . Cardiac catheterization  12/1988    History  Smoking status  . Never Smoker   Smokeless tobacco  . Never Used    Comment: 01/18/2013 "quit smoking weed ~ 2005"    History  Alcohol Use No    Comment: 01/18/2013 "might  have part of a drink on special occasions"    Family History  Problem Relation Age of Onset  . Heart attack Mother   . Hypertension Mother     Review of Systems: The review of systems is per the HPI.  All other systems were reviewed and are negative.  Physical Exam: BP 150/100  Pulse 80  Ht 6\' 4"  (1.93 m)  Wt 262 lb (118.842 kg)  BMI 31.90 kg/m2 He has had no medicines today.  Patient is very pleasant and in no acute distress. He is of large stature. Skin is warm and dry. Color is normal.  HEENT is unremarkable. Normocephalic/atraumatic. PERRL. Sclera are nonicteric. Neck is supple. No masses. No JVD. Lungs are clear. Cardiac exam shows a regular rate and rhythm. Rate a little faster. +S4.  Abdomen is soft. Extremities are with 1+ edema. Gait and ROM are intact. No gross neurologic deficits noted.  Wt Readings from Last 3 Encounters:  05/08/13 262 lb (118.842 kg)  04/19/13 267 lb (121.11 kg)  04/05/13 269 lb (122.018 kg)     LABORATORY DATA: BMET and BNP are pending  Lab Results  Component Value Date   WBC 8.9 02/28/2013   HGB 12.7* 02/28/2013   HCT 36.9* 02/28/2013   PLT 232 02/28/2013   GLUCOSE 112* 04/12/2013   NA 142 04/12/2013   K 3.8 04/12/2013   CL 105 04/12/2013   CREATININE 1.3 04/12/2013   BUN 18 04/12/2013   CO2 30 04/12/2013   TSH 1.124 01/18/2013   INR 1.12 01/21/2013   Echo Study Conclusions from December 2014  - Left ventricle: The cavity size was moderately dilated. Wall thickness was increased in a pattern of moderate LVH. Systolic function was moderately reduced. The estimated ejection fraction was in the range of 35% to 40%. Diffuse hypokinesis. Regional wall motion abnormalities cannot be excluded. The study is not technically sufficient to allow evaluation of LV diastolic function. - Aortic valve: Mild regurgitation. - Mitral valve: Mild regurgitation. - Left atrium: The atrium was mildly dilated. - Right ventricle: The cavity size was mildly  dilated. - Right atrium: The atrium was mildly dilated. - Tricuspid valve: Moderate regurgitation. - Pulmonic valve: Moderate regurgitation. - Pulmonary arteries: PA peak pressure: 38mm Hg (S).  Coronary angiography:  Coronary dominance: right  Left mainstem: Long and normal.  Left anterior descending (LAD): Normal. D1 large and normal.  Left circumflex (LCx): AV groove normal.  OM large and normal.  Right coronary artery (RCA): Very large. Mild diffuse luminal irregularities. Long mid 40% stenosis. AM very large with PDA. Mild diffuse luminal irregularities. PL large nd normal  Left ventriculography: Left ventricle was crossed for pressures but I did not do an LV gram secondary to elevated end diastolic pressure and CKD  Final Conclusions: Mild coronary plaque. Elevated right sided pressures.  Recommendations: Elevated right sided pressures appear to be related in part to systolic and diastolic heart failure with elevated LVEDP. However, the elevated right sided pressures were somewhat out of proportion to the EDP elevation. If he does not improve with usual treatment and lifestyle changes for acute on chronic systolic and diastolic HF I would consider pulmonary vasodilator therapy.  Minus Breeding  01/21/2013, 11:08 AM   Assessment / Plan:

## 2013-05-08 NOTE — Patient Instructions (Addendum)
Your physician has recommended you make the following change in your medication:  INCREASE Bidil (20-37.5 mg) 2 pills three times daily  Your physician has requested that you have an echocardiogram in 3 months 1 week before your office visit with Dr. Acie Fredrickson. Echocardiography is a painless test that uses sound waves to create images of your heart. It provides your doctor with information about the size and shape of your heart and how well your heart's chambers and valves are working. This procedure takes approximately one hour. There are no restrictions for this procedure.  Your physician wants you to follow-up in: 3 months with Dr. Acie Fredrickson after your echocardiogram.  You will receive a reminder letter in the mail two months in advance. If you don't receive a letter, please call our office to schedule the follow-up appointment.

## 2013-05-11 ENCOUNTER — Other Ambulatory Visit: Payer: Self-pay | Admitting: Cardiology

## 2013-05-11 DIAGNOSIS — I509 Heart failure, unspecified: Secondary | ICD-10-CM

## 2013-05-11 MED ORDER — LISINOPRIL 40 MG PO TABS: 40.0000 mg | ORAL_TABLET | Freq: Every day | ORAL | Status: DC

## 2013-05-22 ENCOUNTER — Encounter: Payer: Self-pay | Admitting: Internal Medicine

## 2013-05-22 ENCOUNTER — Ambulatory Visit (INDEPENDENT_AMBULATORY_CARE_PROVIDER_SITE_OTHER): Payer: Medicaid Other | Admitting: Internal Medicine

## 2013-05-22 VITALS — BP 140/90 | HR 72 | Temp 98.6°F | Ht 76.0 in | Wt 257.8 lb

## 2013-05-22 DIAGNOSIS — M169 Osteoarthritis of hip, unspecified: Secondary | ICD-10-CM

## 2013-05-22 DIAGNOSIS — Z0000 Encounter for general adult medical examination without abnormal findings: Secondary | ICD-10-CM

## 2013-05-22 DIAGNOSIS — M1610 Unilateral primary osteoarthritis, unspecified hip: Secondary | ICD-10-CM

## 2013-05-22 DIAGNOSIS — I509 Heart failure, unspecified: Secondary | ICD-10-CM

## 2013-05-22 DIAGNOSIS — I5022 Chronic systolic (congestive) heart failure: Secondary | ICD-10-CM

## 2013-05-22 DIAGNOSIS — R7309 Other abnormal glucose: Secondary | ICD-10-CM

## 2013-05-22 DIAGNOSIS — Z125 Encounter for screening for malignant neoplasm of prostate: Secondary | ICD-10-CM

## 2013-05-22 DIAGNOSIS — Z8639 Personal history of other endocrine, nutritional and metabolic disease: Secondary | ICD-10-CM

## 2013-05-22 DIAGNOSIS — R739 Hyperglycemia, unspecified: Secondary | ICD-10-CM

## 2013-05-22 DIAGNOSIS — I10 Essential (primary) hypertension: Secondary | ICD-10-CM

## 2013-05-22 DIAGNOSIS — Z8739 Personal history of other diseases of the musculoskeletal system and connective tissue: Secondary | ICD-10-CM

## 2013-05-22 DIAGNOSIS — M1611 Unilateral primary osteoarthritis, right hip: Secondary | ICD-10-CM

## 2013-05-22 DIAGNOSIS — Z13220 Encounter for screening for lipoid disorders: Secondary | ICD-10-CM

## 2013-05-22 DIAGNOSIS — Z862 Personal history of diseases of the blood and blood-forming organs and certain disorders involving the immune mechanism: Secondary | ICD-10-CM

## 2013-05-22 LAB — LIPID PANEL
Cholesterol: 184 mg/dL (ref 0–200)
HDL: 42 mg/dL (ref >39)
LDL Cholesterol: 125 mg/dL — ABNORMAL HIGH (ref 0–99)
TRIGLYCERIDES: 87 mg/dL (ref <150)
Total CHOL/HDL Ratio: 4.4 Ratio
VLDL: 17 mg/dL (ref 0–40)

## 2013-05-22 LAB — URIC ACID: Uric Acid, Serum: 7.8 mg/dL (ref 4.0–7.8)

## 2013-05-22 LAB — GLUCOSE, CAPILLARY: GLUCOSE-CAPILLARY: 108 mg/dL — AB (ref 70–99)

## 2013-05-22 LAB — POCT GLYCOSYLATED HEMOGLOBIN (HGB A1C): Hemoglobin A1C: 5.3

## 2013-05-22 NOTE — Patient Instructions (Addendum)
-  Contact the Medicaid office in regards to your Medicaid card. Once you have your Medicaid card we will be able to refer you to the Orthopedic Surgeon. -You may fax the colonoscopy report to [place]. Our fax number is [phone].  -Follow up with [place] in 3 months for blood pressure recheck or sooner if you have other medical issues.   Please bring your medicines with you each time you come.   Medicines may be  Eye drops  Herbal   Vitamins  Pills  Seeing these help [place] take care of you.

## 2013-05-23 LAB — PSA: PSA: 2.49 ng/mL (ref <=4.00)

## 2013-05-24 NOTE — Progress Notes (Signed)
   Subjective:    Patient ID: [name] [name], male    DOB: 18-Mar-1950, 63 y.o.   MRN: [id number]  Hip Pain   Knee Pain    Mr. Babington is a 63 yr old man resident of [place], originally from the [place], with PMH of CKD stage 2, HTN, gout, sCHF with EF of 35-40% who presents for evaluation of right hip and right knee pain. This is a chronic issue for him. He was seen by a Sports Medicine specialist in [place], his previous residence, with Xrays of the right hip significant for severe degenerative arthritis. He takes no pain medication and walks with a limp, using a cane to stabilize his gait. He feels like he has been putting pressure in his right knee while walking which has caused knee pain. He denies falls. He is very interested in being evaluate by Orthopedic Surgery.  He reports FH of prostate cancer (father and brother) and is also very interested in being monitored for prostate cancer. He has normal urine stream and flow, is able to fully empty his bladder, does not strain to urinate, and does not have low back pain.    Review of Systems  Constitutional: Negative for fever, chills, diaphoresis, appetite change, fatigue and unexpected weight change.  Respiratory: Negative for cough, shortness of breath and wheezing.   Cardiovascular: Negative for chest pain, palpitations and leg swelling.  Gastrointestinal: Negative for abdominal pain.  Genitourinary: Negative for dysuria, frequency and difficulty urinating.  Musculoskeletal: Positive for arthralgias and gait problem. Negative for back pain and joint swelling.  Skin: Negative for color change, rash and wound.  Neurological: Negative for dizziness and light-headedness.  Psychiatric/Behavioral: Negative for agitation.       Objective:   Physical Exam  Nursing note and vitals reviewed. Constitutional: He is oriented to person, place, and time. He appears well-developed and well-nourished. No distress.  Cardiovascular: Normal  rate and regular rhythm.   Pulmonary/Chest: Effort normal. No respiratory distress. He has no wheezes. He has no rales.  Genitourinary:  Declines DRE   Musculoskeletal: He exhibits edema and tenderness.  Right hip flexion limited 2/2 pain. Right hip TTP. Right knee with no effusion or erythema, TTP.  Trace pitting edema bilaterally up to his knees.   Neurological: He is alert and oriented to person, place, and time. Coordination abnormal.  Skin: Skin is warm and dry. He is not diaphoretic.  Psychiatric: He has a normal mood and affect. His behavior is normal.          Assessment & Plan:

## 2013-05-25 DIAGNOSIS — E785 Hyperlipidemia, unspecified: Secondary | ICD-10-CM | POA: Insufficient documentation

## 2013-05-25 DIAGNOSIS — Z13220 Encounter for screening for lipoid disorders: Secondary | ICD-10-CM | POA: Insufficient documentation

## 2013-05-25 DIAGNOSIS — Z0000 Encounter for general adult medical examination without abnormal findings: Secondary | ICD-10-CM | POA: Insufficient documentation

## 2013-05-25 DIAGNOSIS — M1990 Unspecified osteoarthritis, unspecified site: Secondary | ICD-10-CM | POA: Insufficient documentation

## 2013-05-25 DIAGNOSIS — M109 Gout, unspecified: Secondary | ICD-10-CM | POA: Insufficient documentation

## 2013-05-25 DIAGNOSIS — R739 Hyperglycemia, unspecified: Secondary | ICD-10-CM | POA: Insufficient documentation

## 2013-05-25 DIAGNOSIS — Z125 Encounter for screening for malignant neoplasm of prostate: Secondary | ICD-10-CM | POA: Insufficient documentation

## 2013-05-25 NOTE — Assessment & Plan Note (Signed)
BP Readings from Last 3 Encounters:  05/22/13 140/90  05/08/13 150/100  04/19/13 142/76    Lab Results  Component Value Date   NA 142 04/12/2013   K 3.8 04/12/2013   CREATININE 1.3 04/12/2013    Assessment: Blood pressure control:  Controlled Progress toward BP goal:   At goal Comments: He is on Coreg 25mg  BID, Lasix 60mg  BID, Bidil 20-37.5 two tablets TID, lisinopril 40mg  daily, spironolactone 25mg  daily.   Plan: Medications:  continue current medications Educational resources provided:   Self management tools provided:   Other plans: He has follow up with Cardiology in 2 weeks. He will follow up with [place] in 3 months.

## 2013-05-25 NOTE — Assessment & Plan Note (Deleted)
Lipid panel with LDL of 125, t cho of 184, TG of 87, HDL of 42. ASCVD 10-yr risk of 16.7% with recommendation to moderate to high-intensity statin tx.  Pt wants to discuss initiation of statin tx with his Cardiologist, has f/u with them in 2 weeks.

## 2013-05-25 NOTE — Assessment & Plan Note (Signed)
Pt with pain, limited ROM, limp, abnormal gait, hip pain is causing right knee pain.   -Referred to Orthopedic Surgery -Pt declined Rx for pain medication

## 2013-05-25 NOTE — Assessment & Plan Note (Signed)
He reports that he had normal colonoscopy in 2006, done in the [place]. He will contact his medical providers there and request the colonoscopy records to be faxed to [place]. Denies FH of colorectal cancer.  -He needs Tdap, he is not sure when he last had this. Declined vaccination during this visit.

## 2013-05-25 NOTE — Assessment & Plan Note (Addendum)
Stable, no exacerbation. Has close Cardiology follow up with repeat 2D echo scheduled for June.  He is not on statin tx and wants his Cardiologist to decide on statin tx.

## 2013-05-25 NOTE — Assessment & Plan Note (Signed)
Has no s/s concerning for urinary obstruction.  Checked PSA which was wnl

## 2013-05-25 NOTE — Assessment & Plan Note (Signed)
Lipid panel with LDL of 125, t cho of 184, TG of 87, HDL of 42. ASCVD 10-yr risk of 16.7% with recommendation to moderate to high-intensity statin tx.  Of note, pt had cardiac cath on 01/21/13 with RCA with mild diffuse luminal irregularities, long mid 40% stenosis, mild CAD.   --Pt wants to discuss initiation of statin tx with his Cardiologist, has f/u with them in 2 weeks.

## 2013-05-25 NOTE — Assessment & Plan Note (Addendum)
Reports that he has had a joint tapped with synovial fluid with monosodium urate crystals.  Checked uric acid level which was wnl of 7.8.  May benefit from allopurinol tx if uric acid >8, will defer discussion of serial monitoring of uric acid and initiation of tx until his next visit.

## 2013-05-25 NOTE — Assessment & Plan Note (Signed)
Screened for diabetes with HgA1C fo 5.3%, he does not have diabetes.

## 2013-05-27 NOTE — Progress Notes (Signed)
Attending physician note: I have reviewed the presenting complaints, physical findings, and medications with resident physician Dr. Hayes Ludwig and I concur with her management. Murriel Hopper, M.D., [place]

## 2013-05-31 ENCOUNTER — Telehealth: Payer: Self-pay | Admitting: Cardiovascular Disease

## 2013-05-31 NOTE — Telephone Encounter (Signed)
New message     Need to have dental work and need a note saying it is ok.  His bridge came out.  Please fax to [phone]

## 2013-05-31 NOTE — Telephone Encounter (Signed)
Pt called stating he is going to be having some dental work for a bridge replacement in the very near future. Pt states his dentist will not proceed with this until he is cleared by Dr Acie Fredrickson. He would like a clearance letter faxed to [phone]. Informed pt that Dr Acie Fredrickson and nurse are out of the office today but I will forward this message to the both of them for further review.  Pt verbalized understanding and pleased with the follow-up.

## 2013-06-03 NOTE — Telephone Encounter (Signed)
Clearance faxed to [phone] and confirmation received

## 2013-06-03 NOTE — Telephone Encounter (Signed)
Mr. Wiedeman is at low risk for dental procedure.

## 2013-06-06 ENCOUNTER — Ambulatory Visit (INDEPENDENT_AMBULATORY_CARE_PROVIDER_SITE_OTHER): Payer: Medicaid Other | Admitting: Nurse Practitioner

## 2013-06-06 ENCOUNTER — Encounter: Payer: Self-pay | Admitting: Nurse Practitioner

## 2013-06-06 VITALS — BP 150/90 | HR 68 | Ht 76.0 in | Wt 257.8 lb

## 2013-06-06 DIAGNOSIS — I5022 Chronic systolic (congestive) heart failure: Secondary | ICD-10-CM

## 2013-06-06 DIAGNOSIS — I10 Essential (primary) hypertension: Secondary | ICD-10-CM

## 2013-06-06 LAB — BASIC METABOLIC PANEL
BUN: 22 mg/dL (ref 6–23)
CO2: 33 mEq/L — ABNORMAL HIGH (ref 19–32)
Calcium: 9.6 mg/dL (ref 8.4–10.5)
Chloride: 101 mEq/L (ref 96–112)
Creatinine, Ser: 1.4 mg/dL (ref 0.4–1.5)
GFR: 69.03 mL/min (ref >60.00)
Glucose, Bld: 109 mg/dL — ABNORMAL HIGH (ref 70–99)
Potassium: 4 mEq/L (ref 3.5–5.1)
Sodium: 139 mEq/L (ref 135–145)

## 2013-06-06 NOTE — Progress Notes (Signed)
[name] Date of Birth: Jun 08, 1950 Medical Record #[id number]  History of Present Illness: [name] is seen back today for a 1 month check. Seen for Dr. Acie Fredrickson. He has had longstanding HTN, asthma, and prior CVA. Originally from the [place].   Presented earlier this year with increased SOB, DOE and orthopnea for 3 weeks - he was found to be hypertensive, using NSAID, etc. Had been taking some Lasix that he was prescribed while living in [place]. He was cathed - had mild CAD with elevated right sided pressures. Bidil added along with ACE. Renal function improved. EF was 35 to 40%.   He has had several admissions and visits here since his original diagnosis for heart failure. Medicines have been difficult/mixed up. Now on Medicaid. Has had some gout issues as well. Now has PCP.  Comes back today. Here alone. Doing ok. Breathing good. No swelling. Seeing ortho tomorrow and Dr. Acie Fredrickson has already cleared him for hip surgery if needed. He was given hydrocodone and was not able to tolerate. No chest pain. Breathing is good. Weight is down. No swelling. BP is good at home.    Current Outpatient Prescriptions  Medication Sig Dispense Refill  . acetaminophen (TYLENOL) 500 MG tablet Take 1,000 mg by mouth 2 (two) times daily as needed for moderate pain.      . carvedilol (COREG) 25 MG tablet Take 1 tablet (25 mg total) by mouth 2 (two) times daily with a meal.  60 tablet  3  . furosemide (LASIX) 40 MG tablet Take 1.5 tablets (60 mg total) by mouth 2 (two) times daily.  90 tablet  1  . hydrocortisone cream 1 % Apply 1 application topically as needed for itching.      . isosorbide-hydrALAZINE (BIDIL) 20-37.5 MG per tablet Take 2 tablets by mouth 3 (three) times daily.  550 tablet  3  . lisinopril (PRINIVIL,ZESTRIL) 40 MG tablet Take 1 tablet (40 mg total) by mouth daily.  30 tablet  10  . Magnesium 250 MG TABS Take 250 mg by mouth daily.      [name] spironolactone (ALDACTONE) 25 MG tablet Take 1  tablet (25 mg total) by mouth daily.  30 tablet  3   No current facility-administered medications for this visit.    Allergies  Allergen Reactions  . Hydrocodone     Itch and crazy    Past Medical History  Diagnosis Date  . Hypertension   . Systolic heart failure     EF is 35 to 40%  . Asthma     'as a child"   . Shortness of breath     "comes on at anytime" (01/18/2013)  . Sickle cell trait   . Stroke 12/1988  . Arthritis     "left hip; right knee" (01/18/2013)  . Gout     Past Surgical History  Procedure Laterality Date  . 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 Hernia repair  2004?    "above the navel" (01/18/2013)  . Cardiac catheterization  12/1988    History  Smoking status  . Never Smoker   Smokeless tobacco  . Never Used    Comment: 01/18/2013 "quit smoking weed ~ 2005"    History  Alcohol Use No    Comment: 01/18/2013 "might have part of a drink on special occasions"    Family History  Problem Relation Age of Onset  . Heart attack Mother   . Hypertension Mother  Review of Systems: The review of systems is per the HPI.  All other systems were reviewed and are negative.  Physical Exam: BP 150/90  Pulse 68  Ht 6\' 4"  (1.93 m)  Wt 257 lb 12.8 oz (116.937 kg)  BMI 31.39 kg/m2 Patient is very pleasant and in no acute distress. Weight down in 5 pounds. Skin is warm and dry. Color is normal.  HEENT is unremarkable. Normocephalic/atraumatic. PERRL. Sclera are nonicteric. Neck is supple. No masses. No JVD. Lungs are clear. Cardiac exam shows a regular rate and rhythm. Abdomen is soft. Extremities are without edema. Gait and ROM are intact. No gross neurologic deficits noted.  Wt Readings from Last 3 Encounters:  06/06/13 257 lb 12.8 oz (116.937 kg)  05/22/13 257 lb 12.8 oz (116.937 kg)  05/08/13 262 lb (118.842 kg)     LABORATORY DATA: BMET is pending  Lab Results  Component Value Date   WBC 8.9 02/28/2013   HGB 12.7* 02/28/2013   HCT  36.9* 02/28/2013   PLT 232 02/28/2013   GLUCOSE 112* 04/12/2013   CHOL 184 05/22/2013   TRIG 87 05/22/2013   HDL 42 05/22/2013   LDLCALC 125* 05/22/2013   NA 142 04/12/2013   K 3.8 04/12/2013   CL 105 04/12/2013   CREATININE 1.3 04/12/2013   BUN 18 04/12/2013   CO2 30 04/12/2013   TSH 1.124 01/18/2013   PSA 2.49 05/22/2013   INR 1.12 01/21/2013   HGBA1C 5.3 05/22/2013   Echo Study Conclusions from December 2014  - Left ventricle: The cavity size was moderately dilated. Wall thickness was increased in a pattern of moderate LVH. Systolic function was moderately reduced. The estimated ejection fraction was in the range of 35% to 40%. Diffuse hypokinesis. Regional wall motion abnormalities cannot be excluded. The study is not technically sufficient to allow evaluation of LV diastolic function. - Aortic valve: Mild regurgitation. - Mitral valve: Mild regurgitation. - Left atrium: The atrium was mildly dilated. - Right ventricle: The cavity size was mildly dilated. - Right atrium: The atrium was mildly dilated. - Tricuspid valve: Moderate regurgitation. - Pulmonic valve: Moderate regurgitation. - Pulmonary arteries: PA peak pressure: 78mm Hg (S).  Coronary angiography:  Coronary dominance: right  Left mainstem: Long and normal.  Left anterior descending (LAD): Normal. D1 large and normal.  Left circumflex (LCx): AV groove normal. OM large and normal.  Right coronary artery (RCA): Very large. Mild diffuse luminal irregularities. Long mid 40% stenosis. AM very large with PDA. Mild diffuse luminal irregularities. PL large nd normal  Left ventriculography: Left ventricle was crossed for pressures but I did not do an LV gram secondary to elevated end diastolic pressure and CKD  Final Conclusions: Mild coronary plaque. Elevated right sided pressures.  Recommendations: Elevated right sided pressures appear to be related in part to systolic and diastolic heart failure with elevated LVEDP. However, the  elevated right sided pressures were somewhat out of proportion to the EDP elevation. If he does not improve with usual treatment and lifestyle changes for acute on chronic systolic and diastolic HF I would consider pulmonary vasodilator therapy.  Jeneen Rinks Hochrein  01/21/2013, 11:08 AM    Assessment / Plan:  1. Systolic HF - EF of 75% - probably from long standing HTN - for repeat echo in June. See Dr. Acie Fredrickson in July.   2. HTN - BP recheck by me is 120/78. I have left him on his current regimen.   3. CKD - recheck his labs today.  4. Gout  5. Hip pain - seeing ortho tomorrow - Dr. Acie Fredrickson has cleared him already for surgery.   Patient is agreeable to this plan and will call if any problems develop in the interim.   Burtis Junes, RN, [place]  [address] [place]  [place], [place] [address] [phone]

## 2013-06-06 NOTE — Patient Instructions (Addendum)
Stay on your current medicines  We will check your potassium level today and kidney function  Echo as planned in June  See Dr. Acie Fredrickson in July  Keep avoiding the salt, weighing daily and monitoring your blood pressure.  Call the [place] office at [phone] if you have any questions, problems or concerns.

## 2013-07-05 ENCOUNTER — Other Ambulatory Visit (HOSPITAL_COMMUNITY): Payer: Medicaid Other

## 2013-07-08 ENCOUNTER — Ambulatory Visit (HOSPITAL_COMMUNITY): Payer: Medicaid Other | Attending: Cardiology | Admitting: Radiology

## 2013-07-08 DIAGNOSIS — I059 Rheumatic mitral valve disease, unspecified: Secondary | ICD-10-CM | POA: Insufficient documentation

## 2013-07-08 DIAGNOSIS — E669 Obesity, unspecified: Secondary | ICD-10-CM | POA: Insufficient documentation

## 2013-07-08 DIAGNOSIS — Z8673 Personal history of transient ischemic attack (TIA), and cerebral infarction without residual deficits: Secondary | ICD-10-CM | POA: Insufficient documentation

## 2013-07-08 DIAGNOSIS — I509 Heart failure, unspecified: Secondary | ICD-10-CM | POA: Diagnosis present

## 2013-07-08 DIAGNOSIS — I379 Nonrheumatic pulmonary valve disorder, unspecified: Secondary | ICD-10-CM | POA: Insufficient documentation

## 2013-07-08 DIAGNOSIS — I10 Essential (primary) hypertension: Secondary | ICD-10-CM

## 2013-07-08 DIAGNOSIS — R0602 Shortness of breath: Secondary | ICD-10-CM | POA: Insufficient documentation

## 2013-07-08 DIAGNOSIS — I5022 Chronic systolic (congestive) heart failure: Secondary | ICD-10-CM

## 2013-07-08 NOTE — Progress Notes (Signed)
Echocardiogram performed.  

## 2013-07-09 ENCOUNTER — Other Ambulatory Visit (HOSPITAL_COMMUNITY): Payer: Self-pay | Admitting: Internal Medicine

## 2013-07-10 ENCOUNTER — Other Ambulatory Visit: Payer: Self-pay | Admitting: *Deleted

## 2013-07-10 MED ORDER — CARVEDILOL 25 MG PO TABS: 25.0000 mg | ORAL_TABLET | Freq: Two times a day (BID) | ORAL | Status: DC

## 2013-07-16 ENCOUNTER — Encounter (HOSPITAL_COMMUNITY): Admission: RE | Payer: Self-pay | Source: Ambulatory Visit

## 2013-07-16 ENCOUNTER — Inpatient Hospital Stay (HOSPITAL_COMMUNITY): Admission: RE | Admit: 2013-07-16 | Payer: Medicaid Other | Source: Ambulatory Visit | Admitting: Orthopedic Surgery

## 2013-07-16 SURGERY — ARTHROPLASTY, HIP, TOTAL, ANTERIOR APPROACH
Anesthesia: General | Laterality: Left

## 2013-07-23 ENCOUNTER — Encounter: Payer: Self-pay | Admitting: Internal Medicine

## 2013-07-25 ENCOUNTER — Other Ambulatory Visit: Payer: Self-pay | Admitting: *Deleted

## 2013-07-25 MED ORDER — FUROSEMIDE 40 MG PO TABS: 60.0000 mg | ORAL_TABLET | Freq: Two times a day (BID) | ORAL | Status: DC

## 2013-08-05 ENCOUNTER — Ambulatory Visit (INDEPENDENT_AMBULATORY_CARE_PROVIDER_SITE_OTHER): Payer: Medicaid Other | Admitting: Cardiovascular Disease

## 2013-08-05 ENCOUNTER — Encounter: Payer: Self-pay | Admitting: Cardiovascular Disease

## 2013-08-05 VITALS — BP 122/90 | HR 74 | Ht 76.0 in | Wt 255.4 lb

## 2013-08-05 DIAGNOSIS — I5022 Chronic systolic (congestive) heart failure: Secondary | ICD-10-CM

## 2013-08-05 DIAGNOSIS — I509 Heart failure, unspecified: Secondary | ICD-10-CM

## 2013-08-05 NOTE — Patient Instructions (Signed)
Your physician recommends that you continue on your current medications as directed. Please refer to the Current Medication list given to you today.  Your physician recommends that you schedule a follow-up appointment in: 3 months with [name], NP. You will receive a reminder letter in the mail two months in advance. If you don't receive a letter, please call our office to schedule the follow-up appointment.  Your physician wants you to follow-up in: 6 months with Dr. Acie Fredrickson.  You will receive a reminder letter in the mail two months in advance. If you don't receive a letter, please call our office to schedule the follow-up appointment.

## 2013-08-05 NOTE — Assessment & Plan Note (Signed)
[name] [name] seems to be doing better. Have reviewed both of his echocardiograms and I actually think that his LV function has improved from December, 2014. His ejection fraction was initially 25-30% and now I would think that it's at least 35%.  We will continue with his current medications. He's under maximal medical therapy. He has occasional episodes of hypertension but these are not often.  He'll call us  back he has any headaches or dizziness. He'll see [name] in 3 months and I'll see him again in 6 months.

## 2013-08-05 NOTE — Progress Notes (Signed)
[name] [name] Date of Birth: 1950/05/15 Medical Record #[id number]  Problem List: 1. Hypertension 2.  Chronic systolic Congestive heart failure - EF 35-44% 3. History of stroke 4. Gout  History of Present Illness: [name] is seen back today for a post hospital/TOC visit.  [name] [name] He has had longstanding HTN, asthma, and prior CVA. Originally from the [place].   Most recently presented with increased SOB, DOE and orthopnea for 3 weeks - he was found to be hypertensive, using NSAID, etc. Had been taking some Lasix that he was prescribed while living in [place]. He was cathed - had mild CAD with elevated right sided pressures. Bidil added along with ACE. Renal function improved. EF was 35 to 40%.   Comes back today. Here alone. Medicines are all mixed up - on both OTC potassium and prescription and only taking the OTC agent. He is not taking Mobic (which was left on his discharge meds). Has not had his medicines today. His car had to be towed and so he did not eat and thus did not take his medicines. He notes that his breathing is much better. Weight is stable at home. His swelling has improved but not totally resolved. Some ankle pain. Has chronic hip pain and wanted to have THR in the future. No chest pain. Avoiding salt. Does not drink alcohol. Trying to get on Medicaid. Works part time in [place] and needs to return to work so he can buy his medicines. Some headache and he thought it was due to his medicine. He does not have a primary care doctor.   Feb. 12, 2015:  He is seen back for a visit.  His BP has remained elevated.  He was readmitted to the hospital with shortness of breath. He was aggressively diuresed but then developed gout. He had documented uric acid crystals seen on a joint aspiration.  He is not able to avoid the colchicine.  His blood pressure remains elevated.   Avoids salt - canned food, bacon, sausage.  May 08, 2013;  His BP has been ok at home.  He is avoiding  salt.  He had a severe gouty flare up .  He was prescribed colchicine but could not afford it.   He is hoping that he can afford it now that he is on Medicaid.   Also has lots of hip pain.  Has seen a chiropractor.   He likely needs to have a hip replacement.   August 05, 2013: [name] [name] is doing OK.  He saw [name] [name] .  He's been having some nausea for the past 3-4 weeks. He complains of having headaches with the BIDil  - resolves with tylenol.  His edema has resolved    Current Outpatient Prescriptions  Medication Sig Dispense Refill  . acetaminophen (TYLENOL) 500 MG tablet Take 1,000 mg by mouth 2 (two) times daily as needed for moderate pain.      . carvedilol (COREG) 25 MG tablet Take 1 tablet (25 mg total) by mouth 2 (two) times daily with a meal.  60 tablet  0  . furosemide (LASIX) 40 MG tablet Take 1.5 tablets (60 mg total) by mouth 2 (two) times daily.  90 tablet  0  . hydrocortisone cream 1 % Apply 1 application topically as needed for itching.      . isosorbide-hydrALAZINE (BIDIL) 20-37.5 MG per tablet Take 2 tablets by mouth 3 (three) times daily.  550 tablet  3  . lisinopril (PRINIVIL,ZESTRIL) 40 MG tablet  Take 1 tablet (40 mg total) by mouth daily.  30 tablet  10  . Magnesium 250 MG TABS Take 250 mg by mouth daily.      [name] [name] spironolactone (ALDACTONE) 25 MG tablet Take 1 tablet (25 mg total) by mouth daily.  30 tablet  3   No current facility-administered medications for this visit.    Allergies  Allergen Reactions  . Hydrocodone     Itch and crazy    Past Medical History  Diagnosis Date  . Hypertension   . Systolic heart failure     EF is 35 to 40%  . Asthma     'as a child"   . Shortness of breath     "comes on at anytime" (01/18/2013)  . Sickle cell trait   . Stroke 12/1988  . Arthritis     "left hip; right knee" (01/18/2013)  . Gout     Past Surgical History  Procedure Laterality Date  . 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 Hernia repair   2004?    "above the navel" (01/18/2013)  . Cardiac catheterization  12/1988    History  Smoking status  . Never Smoker   Smokeless tobacco  . Never Used    Comment: 01/18/2013 "quit smoking weed ~ 2005"    History  Alcohol Use No    Comment: 01/18/2013 "might have part of a drink on special occasions"    Family History  Problem Relation Age of Onset  . Heart attack Mother   . Hypertension Mother     Review of Systems: The review of systems is per the HPI.  All other systems were reviewed and are negative.  Physical Exam: BP 122/90  Pulse 74  Ht 6\' 4"  (1.93 m)  Wt 255 lb 6.4 oz (115.849 kg)  BMI 31.10 kg/m2 He has had no medicines today.  Patient is very pleasant and in no acute distress. He is of large stature. Skin is warm and dry. Color is normal.  HEENT is unremarkable. Normocephalic/atraumatic. PERRL. Sclera are nonicteric. Neck is supple. No masses. No JVD. Lungs are clear. Cardiac exam shows a regular rate and rhythm. Rate a little faster. +S4.  Abdomen is soft. Extremities are with 1+ edema. Gait and ROM are intact. No gross neurologic deficits noted.  Wt Readings from Last 3 Encounters:  08/05/13 255 lb 6.4 oz (115.849 kg)  06/06/13 257 lb 12.8 oz (116.937 kg)  05/22/13 257 lb 12.8 oz (116.937 kg)     LABORATORY DATA: BMET and BNP are pending  Lab Results  Component Value Date   WBC 8.9 02/28/2013   HGB 12.7* 02/28/2013   HCT 36.9* 02/28/2013   PLT 232 02/28/2013   GLUCOSE 109* 06/06/2013   CHOL 184 05/22/2013   TRIG 87 05/22/2013   HDL 42 05/22/2013   LDLCALC 125* 05/22/2013   NA 139 06/06/2013   K 4.0 06/06/2013   CL 101 06/06/2013   CREATININE 1.4 06/06/2013   BUN 22 06/06/2013   CO2 33* 06/06/2013   TSH 1.124 01/18/2013   PSA 2.49 05/22/2013   INR 1.12 01/21/2013   HGBA1C 5.3 05/22/2013   Echo Study Conclusions from December 2014  - Left ventricle: The cavity size was moderately dilated. Wall thickness was increased in a pattern of moderate LVH. Systolic  function was moderately reduced. The estimated ejection fraction was in the range of 35% to 40%. Diffuse hypokinesis. Regional wall motion abnormalities cannot be excluded. The  study is not technically sufficient to allow evaluation of LV diastolic function. - Aortic valve: Mild regurgitation. - Mitral valve: Mild regurgitation. - Left atrium: The atrium was mildly dilated. - Right ventricle: The cavity size was mildly dilated. - Right atrium: The atrium was mildly dilated. - Tricuspid valve: Moderate regurgitation. - Pulmonic valve: Moderate regurgitation. - Pulmonary arteries: PA peak pressure: 27mm Hg (S).  Coronary angiography:  Coronary dominance: right  Left mainstem: Long and normal.  Left anterior descending (LAD): Normal. D1 large and normal.  Left circumflex (LCx): AV groove normal. OM large and normal.  Right coronary artery (RCA): Very large. Mild diffuse luminal irregularities. Long mid 40% stenosis. AM very large with PDA. Mild diffuse luminal irregularities. PL large nd normal  Left ventriculography: Left ventricle was crossed for pressures but I did not do an LV gram secondary to elevated end diastolic pressure and CKD  Final Conclusions: Mild coronary plaque. Elevated right sided pressures.      Assessment / Plan:

## 2013-08-07 ENCOUNTER — Ambulatory Visit (INDEPENDENT_AMBULATORY_CARE_PROVIDER_SITE_OTHER): Payer: Medicaid Other | Admitting: Internal Medicine

## 2013-08-07 ENCOUNTER — Encounter: Payer: Self-pay | Admitting: Internal Medicine

## 2013-08-07 VITALS — BP 111/72 | HR 77 | Temp 99.2°F | Ht 76.0 in | Wt 255.5 lb

## 2013-08-07 DIAGNOSIS — Z0000 Encounter for general adult medical examination without abnormal findings: Secondary | ICD-10-CM

## 2013-08-07 DIAGNOSIS — I10 Essential (primary) hypertension: Secondary | ICD-10-CM

## 2013-08-07 DIAGNOSIS — M169 Osteoarthritis of hip, unspecified: Secondary | ICD-10-CM

## 2013-08-07 DIAGNOSIS — M1610 Unilateral primary osteoarthritis, unspecified hip: Secondary | ICD-10-CM

## 2013-08-07 NOTE — Assessment & Plan Note (Addendum)
The patient's orthopedic surgeon is requesting pre-operative evaluation prior to left total hip replacement.  The patient has a low to intermediate risk of MACE based on revised cardiac risk index for per-operative risk.  His risk of MACE is 11%, however his chronic medical conditions are well controlled.  Furthermore, he has tolerated anesthesia in prior surgery (hernia repair, no complications per patient).  Also, he is a non-smoker and has no known lung disease.  I believe he can proceed with surgery.

## 2013-08-07 NOTE — Assessment & Plan Note (Signed)
I will have patient return to clinic in 2-3 months for follow-up and referral for colonoscopy (after he has recovered from hip surgery).

## 2013-08-07 NOTE — Patient Instructions (Signed)
1. I have filled out your paperwork for your hip replacement surgery.    2. Your dizziness is likely related to your blood pressure medications, however these medications are important for keeping your blood pressure and heart failure controlled.  Take your time when you go from sitting to standing.  Stay hydrated (especially during these warm summer months).  If you experience vision changes, problems breathing, weakness or pass out, please seek emergency help.  3. Please take all medications as prescribed.   4. If you have worsening of your symptoms or new symptoms arise, please call the clinic ([phone]), or go to the ER immediately if symptoms are severe.

## 2013-08-07 NOTE — Assessment & Plan Note (Addendum)
BP Readings from Last 3 Encounters:  08/07/13 111/72  08/05/13 122/90  06/06/13 150/90    Lab Results  Component Value Date   NA 139 06/06/2013   K 4.0 06/06/2013   CREATININE 1.4 06/06/2013    Assessment: Blood pressure control:  well controlled Progress toward BP goal:   at goal Comments: BP 89/57 initially but responded to oral hydration.  Also, orthostatic in clinic.  His occasional dizziness is likely 2/2 to low BP from anti-hypertensives.  He has not had medication changes in the past month so the change may be due to excess fluid losses in the heat of summer.  When he feels dizzy (about once weekly) and checks his BP it is usually 80s/60s but responds to fluids and rest.  He checks his BP at home regularly and most values are normal to moderately elevated.     Plan: Medications:  continue current medications:  Coreg 25mg  BID, Lasix 40mg  daily, Bidil 40-75mg  TID, lisinopril 40 daily, spironolactone 25mg  daily Other plans: continuing current medications for HTN/HF to reduce afterload and improve function; I have advised him to be take it slowly when rising from seated to standing; he is to stay hydrated; he is to continue checking his BP regularly and write these values down so he can bring them to next visit; he is to seek emergency help if he develops severe symptoms including chest pain, change in vision, dyspnea or syncope.

## 2013-08-07 NOTE — Progress Notes (Addendum)
Subjective:    Patient ID: [name] [name], male    DOB: 1951-01-16, 63 y.o.   MRN: [id number]  HPI Comments: [name] [name] is a 63 year old male with a PMH of chronic systolic HF (EF 40-98% June 2015, closer to 35% per cardiology interpretation), HTN, prior CVA, OA and CKD2.  He presents for evaluation prior to undergoing left total hip replacement.  He has no complaint today and specifically denies chest pain, palpitations, dyspnea, PND, lower extremity edema, syncope, change in vision or weakness.  He was last evaluated by his Cardiologist two days ago.  His BP is low today, 89/57.  He says it is sometimes low after he takes his medications but then goes up.  He feels dizzy today and this started on the drive to [facility] appointment.  He has eaten and had fluids today.  He denies diarrhea.  He had epigastric pain that felt like gas yesterday. He then vomited twice yesterday after drinking lemongrass/ginger tea for the gas.  Felt better after he vomited.  It is unusual for him to vomit.  He did not eat at new restaurant and was not around anyone sick.  He has had normal BM today and vomiting has resolved.  He has been having occasional dizziness (about once per week) for the past month.  No medication changes at that time.  When he feels dizzy he checks his BP and it is usually in the 80s/60s.  His sits downs and drinks something and feels well after about 3 hours.  He then rechecks it and it is normal, 120/80s.  On an average day, BP is usually 140/80 in AM, 160/90s around 3PM, 130/80-90s in the PM.  These readings are prior to medication.    Had recent gout flare this past weekend after missing 5 days of colchicine.  Restarted on Sunday and it quickly resolved.   Walks half mile twice per week, gardening at home.  He tolerates these activities with no dyspnea or chest pain.  Non-smoker.  No EtOH or drug use.       Review of Systems  Constitutional: Negative for fever, chills, appetite change and fatigue.   Respiratory: Negative for cough and shortness of breath.   Cardiovascular: Negative for chest pain, palpitations and leg swelling.  Gastrointestinal: Positive for vomiting. Negative for nausea, abdominal pain, diarrhea, constipation and blood in stool.       2 episodes of vomiting yesterday, resolved today  Endocrine: Negative for polydipsia and polyuria.  Genitourinary: Negative for dysuria and hematuria.  Musculoskeletal: Positive for arthralgias.  Neurological: Positive for dizziness and light-headedness. Negative for syncope, weakness and headaches.       Objective:   Physical Exam  Vitals reviewed. Constitutional: He is oriented to person, place, and time. He appears well-developed. No distress.  HENT:  Head: Normocephalic and atraumatic.  Mouth/Throat: Oropharynx is clear and moist. No oropharyngeal exudate.  Eyes: EOM are normal. Pupils are equal, round, and reactive to light.  Neck: No JVD present. No thyromegaly present.  Cardiovascular: Normal rate, regular rhythm, normal heart sounds and intact distal pulses.  Exam reveals no gallop and no friction rub.   No murmur heard. Pulmonary/Chest: Effort normal and breath sounds normal. No respiratory distress. He has no wheezes. He has no rales.  Abdominal: Soft. Bowel sounds are normal. He exhibits no distension. There is no tenderness. There is no rebound and no guarding.  Musculoskeletal: He exhibits no edema and no tenderness.  Left hip limited ROM  2/2 to pain  Lymphadenopathy:    He has no cervical adenopathy.  Neurological: He is alert and oriented to person, place, and time. No cranial nerve deficit.  Skin: Skin is warm. He is not diaphoretic.  Psychiatric: He has a normal mood and affect. His behavior is normal.          Assessment & Plan:  Please see problem based assessment and plan.

## 2013-08-09 NOTE — Progress Notes (Signed)
Case discussed with Dr. Wilson at the time of the visit.  We reviewed the resident's history and exam and pertinent patient test results.  I agree with the assessment, diagnosis, and plan of care documented in the resident's note. 

## 2013-08-11 ENCOUNTER — Other Ambulatory Visit: Payer: Self-pay | Admitting: Cardiovascular Disease

## 2013-08-12 ENCOUNTER — Other Ambulatory Visit: Payer: Self-pay

## 2013-08-12 MED ORDER — CARVEDILOL 25 MG PO TABS: 25.0000 mg | ORAL_TABLET | Freq: Two times a day (BID) | ORAL | Status: DC

## 2013-08-21 ENCOUNTER — Telehealth: Payer: Self-pay | Admitting: Nurse Practitioner

## 2013-08-21 ENCOUNTER — Other Ambulatory Visit: Payer: Self-pay | Admitting: *Deleted

## 2013-08-21 MED ORDER — FUROSEMIDE 40 MG PO TABS: 60.0000 mg | ORAL_TABLET | Freq: Two times a day (BID) | ORAL | Status: DC

## 2013-08-21 NOTE — Telephone Encounter (Signed)
New Message  Pt called requests a call back to. He states that he is unable to walk. Pt is working to get disability. He requests a call back to discuss his option considering his heart condition.

## 2013-08-21 NOTE — Telephone Encounter (Signed)
Please call and let him know that Dr. Acie Fredrickson and I are NOT the ones that disable anyone. He needs to go to the Social Security office to apply. This is a legal definition not a medical definition. They will in turn get him to see their disability doctor and will request his records from all of his doctors.

## 2013-08-21 NOTE — Telephone Encounter (Signed)
Spoke with this patient, he said the message left was that he cannot  "work", not "walk". He would like to be considered disabled due to his heart condition. I advised the patient of his EF and that it may/may not be enough for that. Advised try ortho as well. Will forward to [name], NP. (pt. Is aware she is out of town).

## 2013-08-22 NOTE — Telephone Encounter (Signed)
Pt is aware he verbalized understanding.

## 2013-08-22 NOTE — Telephone Encounter (Signed)
Left pt a message to call back. 

## 2013-08-31 ENCOUNTER — Other Ambulatory Visit: Payer: Self-pay | Admitting: Cardiovascular Disease

## 2013-09-02 ENCOUNTER — Other Ambulatory Visit: Payer: Self-pay

## 2013-09-02 MED ORDER — SPIRONOLACTONE 25 MG PO TABS: 25.0000 mg | ORAL_TABLET | Freq: Every day | ORAL | Status: DC

## 2013-09-11 ENCOUNTER — Ambulatory Visit (INDEPENDENT_AMBULATORY_CARE_PROVIDER_SITE_OTHER): Payer: Medicaid Other | Admitting: Internal Medicine

## 2013-09-11 ENCOUNTER — Encounter: Payer: Self-pay | Admitting: Internal Medicine

## 2013-09-11 VITALS — BP 147/85 | HR 72 | Temp 97.2°F | Wt 257.5 lb

## 2013-09-11 DIAGNOSIS — R454 Irritability and anger: Secondary | ICD-10-CM

## 2013-09-11 DIAGNOSIS — R4589 Other symptoms and signs involving emotional state: Secondary | ICD-10-CM

## 2013-09-11 DIAGNOSIS — Z8673 Personal history of transient ischemic attack (TIA), and cerebral infarction without residual deficits: Secondary | ICD-10-CM

## 2013-09-11 DIAGNOSIS — I10 Essential (primary) hypertension: Secondary | ICD-10-CM

## 2013-09-11 NOTE — Assessment & Plan Note (Signed)
Hx of CVA in 1990.  Just informed me today that the CVA was hemorrhagic and he was told it was due to an aneurysmal rupture.  Unclear how accurate this is as he says he had no deficits and required no surgery.  This was over 20 years ago and we have no recent brain imaging.  He denies family history of aneurysms.  He says he thinks his memory may have been affected but says his memory has improved over the years.  Says he may sometimes forget phone numbers or misplace items.  He remembers names, faces, how to use object and directions.  He does not exhibit dementia symptoms.  He has no neuro deficits on exam.  Again, he has no new symptoms or deficits that make me think he needs immediate imaging.  I will speak with neurology regarding best practice for follow-up in a person with aneurysmal rupture hx.  I will plan to administer MMSE at next visit to formally assess memory.

## 2013-09-11 NOTE — Progress Notes (Signed)
   Subjective:    Patient ID: [name] [name], male    DOB: [birth year], 63 y.o.   MRN: [id number]  HPI Comments: [name] [name] is a 63 year old male with a PMH of chronic systolic HF (EF 71-06% June 2015; likely improved to 35% per cardiology interpretation), HTN, prior CVA, OA and CKD2.  He made this appointment because he is having some forgetfulness that he feels may be related to CVA > 20 years ago.  He reports ICH in 1990 and says he was told it was due to aneurysmal rupture, he had no deficits, required no surgery, was in hospital for two weeks and was started on BP medications.  He says this was the first time he was told he had high BP.  He feels his memory was affected but says he feels his memory has improved since the period following the stroke. He wants to know if he should see a specialist since he feels he has a tendency to forget some things, misplacing items, may forget events, telephone numbers.occasionally.  He does not forget how to get places, not forgeting how to use items, not forgetting names.    He brought a log of his BP.  He is checking several times per day with highest values first thing in the AM.   He is walking about 4 days per week.  Reports feeling anger towards his siblings who live overseas in the [place].  Longstanding anger has resurfaced since he recently lost one of his brothers.  He request referral for therapy to deal with anger.   Also, interested in finding an affordable place to get eyeglasses as medicaid will not cover.       Review of Systems  Constitutional: Negative for fever, chills, appetite change and fatigue.  Eyes: Negative for visual disturbance.  Respiratory: Negative for cough and shortness of breath.   Cardiovascular: Negative for chest pain, palpitations and leg swelling.  Gastrointestinal: Negative for nausea, vomiting, abdominal pain, diarrhea, constipation and blood in stool.  Endocrine: Negative for polydipsia and polyuria.    Genitourinary: Negative for dysuria, frequency and hematuria.  Neurological: Positive for headaches. Negative for seizures, syncope, facial asymmetry, speech difficulty, weakness and light-headedness.       Occasional headaches associated with Bidil, respond to Tylenol.       Objective:   Physical Exam  Vitals reviewed. Constitutional: He is oriented to person, place, and time. He appears well-developed. No distress.  HENT:  Head: Normocephalic and atraumatic.  Mouth/Throat: Oropharynx is clear and moist. No oropharyngeal exudate.  Eyes: EOM are normal. Pupils are equal, round, and reactive to light.  Cardiovascular: Normal rate, regular rhythm and normal heart sounds.  Exam reveals no gallop and no friction rub.   No murmur heard. Pulmonary/Chest: Effort normal and breath sounds normal. No respiratory distress. He has no wheezes. He has no rales.  Abdominal: Soft. Bowel sounds are normal. He exhibits no distension. There is no tenderness.  Musculoskeletal: He exhibits no edema and no tenderness.  Neurological: He is alert and oriented to person, place, and time. No cranial nerve deficit.  Skin: Skin is warm. He is not diaphoretic.  Psychiatric: He has a normal mood and affect. His behavior is normal.          Assessment & Plan:  Please see problem based assessment and plan.

## 2013-09-11 NOTE — Assessment & Plan Note (Signed)
[name] [name] reports having anger towards his siblings related to unresolved family conflicts.  This anger has resurfaced since his brother recently passed away.  He reports anger to his brother and says he would punch him if he was here.  His brother is in the [place].  He denies SI/HI at this time.  He has support system of his wife and children locally.  He does not appear to be imminent threat to himself or anyone else.  He is requesting referral to speak with a therapist/counselor regarding this long-held anger. - referral to [name]

## 2013-09-11 NOTE — Patient Instructions (Signed)
General Instructions: 1. Our social worker Edwena Blow will call you with resources for therapy/counselor to help with your anger issues.  2. Please take all medications as prescribed.   3. If you have worsening of your symptoms or new symptoms arise, please call the clinic ([phone]), or go to the ER immediately if symptoms are severe.  Please try to bring all your medicines next time. This helps [place] take good care of you and stops mistakes from medicines that could hurt you.   Treatment Goals:  Goals (1 Years of Data) as of 09/11/13         As of Today 08/07/13 08/07/13 08/07/13 08/07/13     Blood Pressure    . Blood Pressure < 150/90  147/85 111/72 113/63 135/72 88/64      Progress Toward Treatment Goals:  Treatment Goal 04/19/2013  Blood pressure at goal    Self Care Goals & Plans:  Self Care Goal 09/11/2013  Manage my medications take my medicines as prescribed  Monitor my health -  Eat healthy foods eat more vegetables; eat foods that are low in salt  Be physically active take a walk every day    No flowsheet data found.   Care Management & Community Referrals:  Referral 03/25/2013  Referrals made for care management support financial counselor    Please return to clinic in 2 - 3 months (after surgery) or sooner if needed.

## 2013-09-11 NOTE — Assessment & Plan Note (Signed)
BP Readings from Last 3 Encounters:  09/11/13 147/85  08/07/13 111/72  08/05/13 122/90    Lab Results  Component Value Date   NA 139 06/06/2013   K 4.0 06/06/2013   CREATININE 1.4 06/06/2013    Assessment: Blood pressure control:  slightly elevated Progress toward BP goal:    Comments: BP logs reviewed.  Tends to run high (160s-190s SBP) first thing in the AM but then normal and a few lows (80-90s SBP) during the day.  I am hesitant to increase medications as he does report occasional dizziness with BP medications and was orthostatic last visit.  My fear is increasing medication may drop his low readings even lower.  I am also not sure how accurate his home meter is as clinic readings have always been well controlled.    Plan: Medications:  continue current medications:  Coreg 25mg  BID, Lasix 40mg  daily, Bidil 40-75mg  TID, lisinopril 40 daily, spironolactone 25mg  daily Other plans: I have asked him to continue BP checks several x daily and to call [facility] or cardiology if low or high values.  He is to seek ER help if new symptoms such as sudden, persistent HA, change in vision, chest pain, weakness or difficulty breathing.

## 2013-09-12 ENCOUNTER — Other Ambulatory Visit (HOSPITAL_COMMUNITY): Payer: Self-pay | Admitting: Internal Medicine

## 2013-09-13 NOTE — Progress Notes (Signed)
Internal Medicine Clinic Attending Date of visit: 09/11/2013   Case discussed with Dr. Redmond Pulling soon after the resident saw the patient.  We reviewed the resident's history and exam and pertinent patient test results.  I agree with the assessment, diagnosis, and plan of care documented in the resident's note.

## 2013-09-16 ENCOUNTER — Telehealth: Payer: Self-pay | Admitting: Licensed Clinical Social Worker

## 2013-09-16 ENCOUNTER — Encounter: Payer: Self-pay | Admitting: Licensed Clinical Social Worker

## 2013-09-16 NOTE — Telephone Encounter (Signed)
Mr. Prestia was referred to CSW for referrals for Anger Management.  Pt has Medicaid insurance, utilized the [employer] cross checked with [employer].  CSW placed call to Mr. Veldhuizen, pt states he would like to get in as soon as possible for counseling.  Mr. Maryclare Labrador requesting CSW to email providers to him.  CSW inquired about MyChart; pt unsure about his log in information.  CSW will email providers, notifying Mr. Venezia information will be generic based on emailing information.  CSW discussed [facility] [employer] [facility] program.  Pt aware CSW will send information and application through the mail.  Pt states he is having surgery 10/08/13.  CSW will send patient information and sign off.  Pt aware CSW is available to assist as needed.

## 2013-09-23 ENCOUNTER — Telehealth: Payer: Self-pay | Admitting: Internal Medicine

## 2013-09-23 DIAGNOSIS — Z8673 Personal history of transient ischemic attack (TIA), and cerebral infarction without residual deficits: Secondary | ICD-10-CM

## 2013-09-23 NOTE — Assessment & Plan Note (Signed)
I spoke with Neuro regarding Ms. Ebbert's questionable hx of aneurysmal rupture over 20 years ago.  Neuro informed me that the patient would have been informed if there were additional aneurysms and given appropriate follow-up recommendations at the time of his hospitalization in 1990.  Since he has no new focal deficits, has remained without further CNS events since 1990, and he was not told of the presence of other aneurysms, it is unlikely that he has further aneurysms.  CTA or MRA could be done to definitively rule out, however this could jeopardized his already declining kidney function (CKD2) and likely not change management.

## 2013-09-23 NOTE — Telephone Encounter (Signed)
Updated CVA on problem list.

## 2013-09-23 NOTE — H&P (Signed)
  [name] [name]/[facility] [address] [place], [place] [address] [phone] A Division of [facility]  [name] [name], M.D.   [name] [name], M.D.   [name] [name], M.D.   [name] [name], M.D.   [name] [name], M.D [name] [name]. [name] [name], M.D.  [name] [name], M.D.  [name] [name], M.D.    [name] [name], M.D. [name] L. Fenton Malling, PA-C  [name] A. Shepperson, PA-C  [name] [place], PA-C [name] [name], [place]   RE: [name] [name], [name] [name]   [id number]      DOB: [birth year] PROGRESS NOTE: 09-09-13 Reason for visit:  Follow-up left hip end stage arthritis. New complaint of right ring finger catching. History of present illness: [name] is a pleasant 63 year old who has severe left hip arthritis. He originally was set up for total hip arthroplasty in June but needed dental work and we delayed that until he finished his dental work which has been completed. He comes in with right knee and left hip pain as well as right ring finger catching. He has tried NSAID's and injections activity modification without significant relief. His pain has been detrimental to him he's unable to work due to pain. I feel that total hip arthroplasty is a good bet for helping to let him be more active. Please see associated documentation for this clinic visit for further past medical, family, surgical and social history, review of systems, and exam findings as this was reviewed by me.  EXAMINATION: Well appearing male in no apparent distress. Left lower extremity has limited hip range of motion. He has a Trendelenburg gait. Right lower extremity has right knee joint line tenderness but good range of motion and stable to ligament exam. Right upper extremity has triggering of his right ring finger and a palpable nodule at his tendon.  IMAGING: None today.  ASSESSMENT/PLAN: 1. For his hip he has exhausted non-operative measures and I feel he would benefit from a  total hip arthroplasty. Discussed risks benefits and possible complications and he would like to go forward with this. This is a detriment to his life he has been unable to work due to this pain and his activities of daily living have been effected as well. 2. For his right knee we will continue conservative treatment.  3. Right finger triggering does not cause his pain. I offered an injection but he would like to hold on that. We will readdress this after his hip I do not recommend we do both at the same time due to his need to use a walker after hip surgery.  [name] [name].  [name] [name], M.D. Electronically verified by [name] [name]. [name] [name], M.D. TDM:kah D 09-09-13 T 09-10-13

## 2013-09-26 ENCOUNTER — Encounter (HOSPITAL_COMMUNITY): Payer: Self-pay | Admitting: Pharmacy Technician

## 2013-09-27 ENCOUNTER — Encounter (HOSPITAL_COMMUNITY): Payer: Self-pay

## 2013-09-27 ENCOUNTER — Encounter (HOSPITAL_COMMUNITY)
Admission: RE | Admit: 2013-09-27 | Discharge: 2013-09-27 | Disposition: A | Discharge status: Home / Self Care | Payer: Medicaid Other | Source: Ambulatory Visit | Attending: Orthopedic Surgery | Admitting: Orthopedic Surgery

## 2013-09-27 ENCOUNTER — Other Ambulatory Visit: Payer: Self-pay | Admitting: Orthopedic Surgery

## 2013-09-27 DIAGNOSIS — Z01818 Encounter for other preprocedural examination: Secondary | ICD-10-CM | POA: Diagnosis present

## 2013-09-27 DIAGNOSIS — M1610 Unilateral primary osteoarthritis, unspecified hip: Secondary | ICD-10-CM | POA: Diagnosis present

## 2013-09-27 DIAGNOSIS — M169 Osteoarthritis of hip, unspecified: Secondary | ICD-10-CM | POA: Insufficient documentation

## 2013-09-27 HISTORY — DX: Cerebral aneurysm, nonruptured: I67.1

## 2013-09-27 HISTORY — DX: Coagulation defect, unspecified: D68.9

## 2013-09-27 LAB — URINALYSIS, ROUTINE W REFLEX MICROSCOPIC
Bilirubin Urine: NEGATIVE
Glucose, UA: NEGATIVE mg/dL
Hgb urine dipstick: NEGATIVE
Ketones, ur: NEGATIVE mg/dL
LEUKOCYTES UA: NEGATIVE
NITRITE: NEGATIVE
PROTEIN: NEGATIVE mg/dL
Specific Gravity, Urine: 1.014 (ref 1.005–1.030)
Urobilinogen, UA: 0.2 mg/dL (ref 0.0–1.0)
pH: 6 (ref 5.0–8.0)

## 2013-09-27 LAB — APTT: APTT: 27 s (ref 24–37)

## 2013-09-27 LAB — BASIC METABOLIC PANEL
Anion gap: 10 (ref 5–15)
BUN: 22 mg/dL (ref 6–23)
CALCIUM: 9.6 mg/dL (ref 8.4–10.5)
CO2: 29 meq/L (ref 19–32)
CREATININE: 1.35 mg/dL (ref 0.50–1.35)
Chloride: 103 mEq/L (ref 96–112)
GFR calc Af Amer: 64 mL/min — ABNORMAL LOW (ref >90)
GFR calc non Af Amer: 55 mL/min — ABNORMAL LOW (ref >90)
Glucose, Bld: 87 mg/dL (ref 70–99)
Potassium: 4.3 mEq/L (ref 3.7–5.3)
Sodium: 142 mEq/L (ref 137–147)

## 2013-09-27 LAB — CBC
HEMATOCRIT: 41.1 % (ref 39.0–52.0)
Hemoglobin: 14.1 g/dL (ref 13.0–17.0)
MCH: 31.8 pg (ref 26.0–34.0)
MCHC: 34.3 g/dL (ref 30.0–36.0)
MCV: 92.8 fL (ref 78.0–100.0)
PLATELETS: 227 10*3/uL (ref 150–400)
RBC: 4.43 MIL/uL (ref 4.22–5.81)
RDW: 12.8 % (ref 11.5–15.5)
WBC: 8.5 10*3/uL (ref 4.0–10.5)

## 2013-09-27 LAB — SURGICAL PCR SCREEN
MRSA, PCR: NEGATIVE
STAPHYLOCOCCUS AUREUS: NEGATIVE

## 2013-09-27 LAB — TYPE AND SCREEN
ABO/RH(D): A POS
ANTIBODY SCREEN: NEGATIVE

## 2013-09-27 LAB — ABO/RH: ABO/RH(D): A POS

## 2013-09-27 LAB — PROTIME-INR
INR: 1.09 (ref 0.00–1.49)
Prothrombin Time: 14.1 seconds (ref 11.6–15.2)

## 2013-09-27 NOTE — Progress Notes (Signed)
Anesthesia PAT Evaluation:  Patient is a 63 year old male scheduled for left THA, anterior approach on 10/08/13 by Dr. Edmonia Lynch.  Case is posted for GA. He wants to also talk about spinal anesthesia as an option with his anesthesiologist.  History includes non-smoker, chronic systolic CHF, intermittent SOB, CVA (hemorrhagic CVA) '90, childhood asthma, arthritis, gout, medication related headaches, family history of clotting disorder ("slow clotting"; he had excessive bleeding after teeth extractions at age 30, but none with adult surgeries or after dental extractions '15; he thought his father had hemophilia and his brother required plasmaphoresis for thrombocytopenia), sickle cell trait, CKD stage II by noted.  OSA screening score is 5.   PCP is with [facility], last visit with Dr. Duwaine Maxin on 09/11/13 who felt he could proceed with surgery with low to intermediate risk (see 08/07/13 office note). Cardiologist is Dr. Acie Fredrickson, last visit was on .  His actual surgical clearance note was from 06/03/13 and stated, "Informed today that he likely needs to have a hip replacement. I think that he would be at low risk for his hip surgery. I've encouraged him to go ahead and called the orthopedic surgeons."  Meds includes Magnesium, acetaminophen, colchicine, carvedilol, furosemide, isosorbide-hydralazine, lisinopril, spironolactone.  EKG on 02/28/13 showed: NSR, biatrial enlargement, LAD, pulmonary disease pattern, non-specific ST/T wave abnormality.  T wave inversions more evident in lateral leads since last tracing 01/18/13.  Echo on 07/08/13 showed:  - Left ventricle: The cavity size was mildly dilated. Wall thickness was increased in a pattern of severe LVH. Systolic function was severely reduced. The estimated ejection fraction was in the range of 25% to 30%. Diffuse hypokinesis. Features are consistent with a pseudonormal left ventricular filling pattern, with concomitant abnormal relaxation and  increased filling pressure (grade 2 diastolic dysfunction). - Aortic valve: There was trivial regurgitation. - Aortic root: The aortic root was mildly dilated. 40mm. - Ascending aorta: The ascending aorta was mildly dilated. 41mm. - Mitral valve: There was mild regurgitation. - Left atrium: The atrium was mildly dilated. - Right ventricle: The cavity size was mildly dilated. - Right atrium: The atrium was mildly dilated. - Pulmonary arteries: Systolic pressure was mildly to moderately increased. PA peak pressure: 44 mm Hg (S). Impressions: Severe global reduction in LV function; myocardium with speckled appearance; consider amyloid; grade 2 diastolic dysfunction, mild LAE/RAE/RVE. (EF on 01/18/13 was 35-40% by echo. According to Dr. Elmarie Shiley 08/05/13 note, "Have reviewed both of his echocardiograms and I actually think that his LV function has improved from December, 2014. His ejection fraction was initially 25-30% and now I would think that it's at least 35%.")  Coronary angiography 01/18/13:  Coronary dominance: right  Left mainstem: Long and normal.  Left anterior descending (LAD): Normal. D1 large and normal.  Left circumflex (LCx): AV groove normal. OM large and normal.  Right coronary artery (RCA): Very large. Mild diffuse luminal irregularities. Long mid 40% stenosis. AM very large with PDA. Mild diffuse luminal irregularities. PL large nd normal  Left ventriculography: Left ventricle was crossed for pressures but I did not do an LV gram secondary to elevated end diastolic pressure and CKD  Final Conclusions: Mild coronary plaque. Elevated right sided pressures.   CXR on 03/04/13 showed: FINDINGS: The heart is borderline enlarged but stable. Mild tortuosity of the thoracic aorta. Persistent bronchitic lung changes without focal infiltrate or overt pulmonary edema. No pleural effusions. IMPRESSION: Stable cardiac enlargement bronchitic lung changes.  Preoperative labs noted.  Cr 1.35.  Glucose 87.  CBC, PT/PTT WNL.  T&S done. Urine culture is pending.  Patient had some concerns about surgery since he has known LV dysfunction. He is aware that his PCP and cardiologist have cleared him for surgery.  Fortunately, he has been doing well for a CV/CHF standpoint.  His weights have been stable.  He is able to lie flat.  He is able to Walk at [facility] (1-2 laps, ~ 1/2 - 1 mile). No SOB at rest, but some exertional dyspnea with more strenuous activity.  He does not have significant dyspnea when he is walking at the park. On exam, his heart RRR, occasional ectopic beat, no murmur noted.  Lungs clear. No significant LE edema.    He understands that he should let Dr. Percell Miller and Dr. Acie Fredrickson know if he has any new CHF symptoms prior to surgery, as those would need to be addressed.  He looks good today with no s/s of acute CHF. If no new changes then I think he can proceed as planned.  In regards to spinal anesthesia, I told him that due to his somewhat vague family history of clotting disorder, his anesthesiologist may prefer GA.  (Hemophilia is X-linked so even if his father had this history, it should not affect him unless his mother had it as well.)  Dr. Tamala Julian felt his assigned anesthesiologist would likely prefer GA due to his LV dysfunction. Patient is aware. I offered to have him speak with an anesthesiologist during his PAT visit, but he stated he was okay to talk with his assigned anesthesiologist on the day of surgery to determine the definitive anesthesia plan.  George Hugh [facility]/Anesthesiology Phone [phone] 09/27/2013 4:03 PM

## 2013-09-27 NOTE — Pre-Procedure Instructions (Signed)
[name] [name]  09/27/2013   Your procedure is scheduled on:  Tuesday, September 15  Report to [facility] Main Entrance "A" at 5:30AM.  Call this number if you have problems the morning of surgery: [phone]   Remember:   Do not eat food or drink liquids after midnight.   Take these medicines the morning of surgery with A SIP OF WATER: carvedilol (coreg), colchicine, isosorbide-hydralazine,   Do not wear jewelry, make-up or nail polish.  Do not wear lotions, powders, or perfumes. You may wear deodorant.  Do not shave 48 hours prior to surgery. Men may shave face and neck.  Do not bring valuables to the hospital.  [facility] is not responsible    for any belongings or valuables.               Contacts, dentures or bridgework may not be worn into surgery.  Leave suitcase in the car. After surgery it may be brought to your room.  For patients admitted to the hospital, discharge time is determined by your                treatment team.               Patients discharged the day of surgery will not be allowed to drive  home.  Name and phone number of your driver:   Special Instructions: review preparing for surgery   Please read over the following fact sheets that you were given: Pain Booklet, Coughing and Deep Breathing, Blood Transfusion Information, MRSA Information and Surgical Site Infection Prevention

## 2013-09-27 NOTE — Progress Notes (Signed)
09/27/13 1028  OBSTRUCTIVE SLEEP APNEA  Have you ever been diagnosed with sleep apnea through a sleep study? No  Do you snore loudly (loud enough to be heard through closed doors)?  1  Do you often feel tired, fatigued, or sleepy during the daytime? 0  Has anyone observed you stop breathing during your sleep? 0  Do you have, or are you being treated for high blood pressure? 1  BMI more than 35 kg/m2? 0  Age over 61 years old? 1  Neck circumference greater than 40 cm/16 inches? 1  Gender: 1  Obstructive Sleep Apnea Score 5  Score 4 or greater  Results sent to PCP

## 2013-09-28 LAB — URINE CULTURE
Colony Count: NO GROWTH
Culture: NO GROWTH

## 2013-10-03 ENCOUNTER — Encounter: Payer: Self-pay | Admitting: Licensed Clinical Social Worker

## 2013-10-04 ENCOUNTER — Other Ambulatory Visit: Payer: Self-pay | Admitting: Internal Medicine

## 2013-10-04 DIAGNOSIS — Z9189 Other specified personal risk factors, not elsewhere classified: Secondary | ICD-10-CM

## 2013-10-07 MED ORDER — CEFAZOLIN SODIUM-DEXTROSE 2-3 GM-% IV SOLR
2.0000 g | INTRAVENOUS | Status: AC
  Administered 2013-10-08: 2 g via INTRAVENOUS
  Filled 2013-10-07: qty 50

## 2013-10-08 ENCOUNTER — Inpatient Hospital Stay (HOSPITAL_COMMUNITY): Payer: Medicaid Other | Admitting: Anesthesiology

## 2013-10-08 ENCOUNTER — Inpatient Hospital Stay (HOSPITAL_COMMUNITY)
Admission: RE | Admit: 2013-10-08 | Discharge: 2013-10-10 | DRG: 470 | Disposition: A | Discharge status: Home Health | Payer: Medicaid Other | Source: Ambulatory Visit | Attending: Orthopedic Surgery | Admitting: Orthopedic Surgery

## 2013-10-08 ENCOUNTER — Encounter (HOSPITAL_COMMUNITY): Payer: Medicaid Other | Admitting: Vascular Surgery

## 2013-10-08 ENCOUNTER — Inpatient Hospital Stay (HOSPITAL_COMMUNITY): Payer: Medicaid Other

## 2013-10-08 ENCOUNTER — Encounter (HOSPITAL_COMMUNITY): Payer: Self-pay | Admitting: Surgery

## 2013-10-08 ENCOUNTER — Encounter (HOSPITAL_COMMUNITY)
Admission: RE | Disposition: A | Discharge status: Home Health | Payer: Self-pay | Source: Ambulatory Visit | Attending: Orthopedic Surgery

## 2013-10-08 DIAGNOSIS — Z7982 Long term (current) use of aspirin: Secondary | ICD-10-CM | POA: Diagnosis not present

## 2013-10-08 DIAGNOSIS — Z8673 Personal history of transient ischemic attack (TIA), and cerebral infarction without residual deficits: Secondary | ICD-10-CM

## 2013-10-08 DIAGNOSIS — M169 Osteoarthritis of hip, unspecified: Secondary | ICD-10-CM

## 2013-10-08 DIAGNOSIS — I10 Essential (primary) hypertension: Secondary | ICD-10-CM | POA: Diagnosis present

## 2013-10-08 DIAGNOSIS — I5022 Chronic systolic (congestive) heart failure: Secondary | ICD-10-CM | POA: Diagnosis not present

## 2013-10-08 DIAGNOSIS — M109 Gout, unspecified: Secondary | ICD-10-CM | POA: Diagnosis present

## 2013-10-08 DIAGNOSIS — D573 Sickle-cell trait: Secondary | ICD-10-CM | POA: Diagnosis present

## 2013-10-08 DIAGNOSIS — I509 Heart failure, unspecified: Secondary | ICD-10-CM | POA: Diagnosis present

## 2013-10-08 DIAGNOSIS — M1610 Unilateral primary osteoarthritis, unspecified hip: Principal | ICD-10-CM | POA: Diagnosis present

## 2013-10-08 DIAGNOSIS — Z79899 Other long term (current) drug therapy: Secondary | ICD-10-CM | POA: Diagnosis not present

## 2013-10-08 DIAGNOSIS — M6530 Trigger finger, unspecified finger: Secondary | ICD-10-CM | POA: Diagnosis not present

## 2013-10-08 DIAGNOSIS — M25559 Pain in unspecified hip: Secondary | ICD-10-CM | POA: Diagnosis present

## 2013-10-08 DIAGNOSIS — M1990 Unspecified osteoarthritis, unspecified site: Secondary | ICD-10-CM | POA: Diagnosis present

## 2013-10-08 HISTORY — DX: Personal history of other diseases of the musculoskeletal system and connective tissue: Z87.39

## 2013-10-08 HISTORY — PX: TOTAL HIP ARTHROPLASTY: SHX124

## 2013-10-08 HISTORY — DX: Headache, unspecified: R51.9

## 2013-10-08 HISTORY — DX: Headache: R51

## 2013-10-08 SURGERY — ARTHROPLASTY, HIP, TOTAL, ANTERIOR APPROACH
Anesthesia: General | Site: Hip | Laterality: Left

## 2013-10-08 MED ORDER — DEXAMETHASONE SODIUM PHOSPHATE 10 MG/ML IJ SOLN
10.0000 mg | Freq: Three times a day (TID) | INTRAMUSCULAR | Status: AC
  Filled 2013-10-08 (×3): qty 1

## 2013-10-08 MED ORDER — ARTIFICIAL TEARS OP OINT
TOPICAL_OINTMENT | OPHTHALMIC | Status: DC | PRN
  Administered 2013-10-08: 1 via OPHTHALMIC

## 2013-10-08 MED ORDER — DEXTROSE 5 % IV SOLN
500.0000 mg | Freq: Four times a day (QID) | INTRAVENOUS | Status: DC | PRN
  Filled 2013-10-08: qty 5

## 2013-10-08 MED ORDER — ONDANSETRON HCL 4 MG PO TABS: 4.0000 mg | ORAL_TABLET | Freq: Four times a day (QID) | ORAL | Status: DC | PRN

## 2013-10-08 MED ORDER — PROMETHAZINE HCL 25 MG/ML IJ SOLN: 6.2500 mg | INTRAMUSCULAR | Status: DC | PRN

## 2013-10-08 MED ORDER — CARVEDILOL 25 MG PO TABS
25.0000 mg | ORAL_TABLET | Freq: Two times a day (BID) | ORAL | Status: DC
  Administered 2013-10-08 – 2013-10-10 (×4): 25 mg via ORAL
  Filled 2013-10-08 (×6): qty 1

## 2013-10-08 MED ORDER — ACETAMINOPHEN 325 MG PO TABS
650.0000 mg | ORAL_TABLET | Freq: Four times a day (QID) | ORAL | Status: DC | PRN
  Administered 2013-10-08: 650 mg via ORAL
  Filled 2013-10-08: qty 2

## 2013-10-08 MED ORDER — FUROSEMIDE 40 MG PO TABS
60.0000 mg | ORAL_TABLET | Freq: Two times a day (BID) | ORAL | Status: DC
  Administered 2013-10-08 – 2013-10-10 (×4): 60 mg via ORAL
  Filled 2013-10-08 (×6): qty 1

## 2013-10-08 MED ORDER — DOCUSATE SODIUM 100 MG PO CAPS
100.0000 mg | ORAL_CAPSULE | Freq: Two times a day (BID) | ORAL | Status: DC
  Administered 2013-10-09 – 2013-10-10 (×3): 100 mg via ORAL
  Filled 2013-10-08 (×6): qty 1

## 2013-10-08 MED ORDER — HYDROCODONE-ACETAMINOPHEN 5-325 MG PO TABS
1.0000 | ORAL_TABLET | ORAL | Status: DC | PRN
  Administered 2013-10-08: 2 via ORAL

## 2013-10-08 MED ORDER — PROPOFOL 10 MG/ML IV BOLUS
INTRAVENOUS | Status: AC
  Filled 2013-10-08: qty 20

## 2013-10-08 MED ORDER — FENTANYL CITRATE 0.05 MG/ML IJ SOLN
INTRAMUSCULAR | Status: AC
  Administered 2013-10-08: 25 ug via INTRAVENOUS
  Filled 2013-10-08: qty 2

## 2013-10-08 MED ORDER — LIDOCAINE HCL (CARDIAC) 20 MG/ML IV SOLN
INTRAVENOUS | Status: DC | PRN
  Administered 2013-10-08: 100 mg via INTRAVENOUS

## 2013-10-08 MED ORDER — PROPOFOL 10 MG/ML IV BOLUS
INTRAVENOUS | Status: DC | PRN
  Administered 2013-10-08: 135 mg via INTRAVENOUS
  Administered 2013-10-08: 25 mg via INTRAVENOUS

## 2013-10-08 MED ORDER — METHOCARBAMOL 500 MG PO TABS
ORAL_TABLET | ORAL | Status: AC
  Filled 2013-10-08: qty 1

## 2013-10-08 MED ORDER — CELECOXIB 200 MG PO CAPS
200.0000 mg | ORAL_CAPSULE | Freq: Two times a day (BID) | ORAL | Status: DC
  Administered 2013-10-08 – 2013-10-10 (×5): 200 mg via ORAL
  Filled 2013-10-08 (×6): qty 1

## 2013-10-08 MED ORDER — BUPIVACAINE LIPOSOME 1.3 % IJ SUSP
20.0000 mL | INTRAMUSCULAR | Status: DC
  Filled 2013-10-08: qty 20

## 2013-10-08 MED ORDER — 0.9 % SODIUM CHLORIDE (POUR BTL) OPTIME
TOPICAL | Status: DC | PRN
  Administered 2013-10-08: 1000 mL

## 2013-10-08 MED ORDER — COLCHICINE 0.6 MG PO TABS
0.6000 mg | ORAL_TABLET | Freq: Two times a day (BID) | ORAL | Status: DC
  Administered 2013-10-08 – 2013-10-10 (×4): 0.6 mg via ORAL
  Filled 2013-10-08 (×5): qty 1

## 2013-10-08 MED ORDER — SODIUM CHLORIDE 0.9 % IJ SOLN
INTRAMUSCULAR | Status: AC
  Filled 2013-10-08: qty 12

## 2013-10-08 MED ORDER — GLYCOPYRROLATE 0.2 MG/ML IJ SOLN
INTRAMUSCULAR | Status: DC | PRN
  Administered 2013-10-08: 0.6 mg via INTRAVENOUS

## 2013-10-08 MED ORDER — ISOSORB DINITRATE-HYDRALAZINE 20-37.5 MG PO TABS
2.0000 | ORAL_TABLET | Freq: Three times a day (TID) | ORAL | Status: DC
  Administered 2013-10-08 – 2013-10-10 (×6): 2 via ORAL
  Filled 2013-10-08 (×9): qty 2

## 2013-10-08 MED ORDER — PHENOL 1.4 % MT LIQD: 1.0000 | OROMUCOSAL | Status: DC | PRN

## 2013-10-08 MED ORDER — MIDAZOLAM HCL 5 MG/5ML IJ SOLN
INTRAMUSCULAR | Status: DC | PRN
  Administered 2013-10-08: 2 mg via INTRAVENOUS

## 2013-10-08 MED ORDER — CEFAZOLIN SODIUM-DEXTROSE 2-3 GM-% IV SOLR: 2.0000 g | INTRAVENOUS | Status: DC

## 2013-10-08 MED ORDER — ASPIRIN EC 325 MG PO TBEC: 325.0000 mg | DELAYED_RELEASE_TABLET | Freq: Every day | ORAL | Status: DC

## 2013-10-08 MED ORDER — MELOXICAM 15 MG PO TABS: 15.0000 mg | ORAL_TABLET | Freq: Every day | ORAL | Status: DC

## 2013-10-08 MED ORDER — FENTANYL CITRATE 0.05 MG/ML IJ SOLN
INTRAMUSCULAR | Status: AC
  Filled 2013-10-08: qty 5

## 2013-10-08 MED ORDER — ACETAMINOPHEN 650 MG RE SUPP: 650.0000 mg | Freq: Four times a day (QID) | RECTAL | Status: DC | PRN

## 2013-10-08 MED ORDER — MAGNESIUM OXIDE 400 (241.3 MG) MG PO TABS
200.0000 mg | ORAL_TABLET | Freq: Every day | ORAL | Status: DC
  Administered 2013-10-09 – 2013-10-10 (×2): 200 mg via ORAL
  Filled 2013-10-08 (×3): qty 0.5

## 2013-10-08 MED ORDER — CEFAZOLIN SODIUM-DEXTROSE 2-3 GM-% IV SOLR
2.0000 g | Freq: Four times a day (QID) | INTRAVENOUS | Status: AC
  Administered 2013-10-08 (×2): 2 g via INTRAVENOUS
  Filled 2013-10-08 (×2): qty 50

## 2013-10-08 MED ORDER — DEXTROSE-NACL 5-0.45 % IV SOLN: 100.0000 mL/h | INTRAVENOUS | Status: DC

## 2013-10-08 MED ORDER — METOCLOPRAMIDE HCL 5 MG PO TABS
5.0000 mg | ORAL_TABLET | Freq: Three times a day (TID) | ORAL | Status: DC | PRN
  Filled 2013-10-08: qty 2

## 2013-10-08 MED ORDER — MIDAZOLAM HCL 2 MG/2ML IJ SOLN
INTRAMUSCULAR | Status: AC
  Filled 2013-10-08: qty 2

## 2013-10-08 MED ORDER — DEXMEDETOMIDINE HCL IN NACL 200 MCG/50ML IV SOLN
INTRAVENOUS | Status: AC
Start: 2013-10-08 — End: 2013-10-08
  Filled 2013-10-08: qty 50

## 2013-10-08 MED ORDER — MAGNESIUM 250 MG PO TABS: 250.0000 mg | ORAL_TABLET | Freq: Every day | ORAL | Status: DC

## 2013-10-08 MED ORDER — ACETAMINOPHEN 500 MG PO TABS
1000.0000 mg | ORAL_TABLET | Freq: Once | ORAL | Status: AC
  Administered 2013-10-08: 1000 mg via ORAL
  Filled 2013-10-08: qty 2

## 2013-10-08 MED ORDER — LACTATED RINGERS IV SOLN
INTRAVENOUS | Status: DC | PRN
  Administered 2013-10-08: 07:00:00 via INTRAVENOUS

## 2013-10-08 MED ORDER — ONDANSETRON HCL 4 MG/2ML IJ SOLN
INTRAMUSCULAR | Status: AC
  Filled 2013-10-08: qty 2

## 2013-10-08 MED ORDER — ONDANSETRON HCL 4 MG PO TABS: 4.0000 mg | ORAL_TABLET | Freq: Three times a day (TID) | ORAL | Status: DC | PRN

## 2013-10-08 MED ORDER — ACETAMINOPHEN 10 MG/ML IV SOLN
INTRAVENOUS | Status: AC
  Filled 2013-10-08: qty 100

## 2013-10-08 MED ORDER — NEOSTIGMINE METHYLSULFATE 10 MG/10ML IV SOLN
INTRAVENOUS | Status: AC
  Filled 2013-10-08: qty 1

## 2013-10-08 MED ORDER — DEXAMETHASONE SODIUM PHOSPHATE 10 MG/ML IJ SOLN
INTRAMUSCULAR | Status: DC | PRN
  Administered 2013-10-08: 10 mg via INTRAVENOUS

## 2013-10-08 MED ORDER — ACETAMINOPHEN 10 MG/ML IV SOLN
INTRAVENOUS | Status: DC | PRN
  Administered 2013-10-08: 1000 mg via INTRAVENOUS

## 2013-10-08 MED ORDER — ROCURONIUM BROMIDE 50 MG/5ML IV SOLN
INTRAVENOUS | Status: AC
Start: 2013-10-08 — End: 2013-10-08
  Filled 2013-10-08: qty 2

## 2013-10-08 MED ORDER — HYDROCODONE-ACETAMINOPHEN 5-325 MG PO TABS: 1.0000 | ORAL_TABLET | ORAL | Status: DC | PRN

## 2013-10-08 MED ORDER — HYDROCODONE-ACETAMINOPHEN 5-325 MG PO TABS
ORAL_TABLET | ORAL | Status: AC
  Filled 2013-10-08: qty 2

## 2013-10-08 MED ORDER — MENTHOL 3 MG MT LOZG: 1.0000 | LOZENGE | OROMUCOSAL | Status: DC | PRN

## 2013-10-08 MED ORDER — GLYCOPYRROLATE 0.2 MG/ML IJ SOLN
INTRAMUSCULAR | Status: AC
  Filled 2013-10-08: qty 3

## 2013-10-08 MED ORDER — METHOCARBAMOL 500 MG PO TABS
500.0000 mg | ORAL_TABLET | Freq: Four times a day (QID) | ORAL | Status: DC | PRN
  Administered 2013-10-08 – 2013-10-09 (×4): 500 mg via ORAL
  Filled 2013-10-08 (×3): qty 1

## 2013-10-08 MED ORDER — NEOSTIGMINE METHYLSULFATE 10 MG/10ML IV SOLN
INTRAVENOUS | Status: DC | PRN
  Administered 2013-10-08: 4 mg via INTRAVENOUS

## 2013-10-08 MED ORDER — HYDRALAZINE HCL 20 MG/ML IJ SOLN: INTRAMUSCULAR | Status: DC | PRN

## 2013-10-08 MED ORDER — ASPIRIN EC 325 MG PO TBEC
325.0000 mg | DELAYED_RELEASE_TABLET | Freq: Every day | ORAL | Status: DC
  Administered 2013-10-09 – 2013-10-10 (×2): 325 mg via ORAL
  Filled 2013-10-08 (×3): qty 1

## 2013-10-08 MED ORDER — METOCLOPRAMIDE HCL 5 MG/ML IJ SOLN: 5.0000 mg | Freq: Three times a day (TID) | INTRAMUSCULAR | Status: DC | PRN

## 2013-10-08 MED ORDER — DOCUSATE SODIUM 100 MG PO CAPS: 100.0000 mg | ORAL_CAPSULE | Freq: Two times a day (BID) | ORAL | Status: DC

## 2013-10-08 MED ORDER — MEPERIDINE HCL 25 MG/ML IJ SOLN
6.2500 mg | INTRAMUSCULAR | Status: DC | PRN
Start: 2013-10-08 — End: 2013-10-08

## 2013-10-08 MED ORDER — FENTANYL CITRATE 0.05 MG/ML IJ SOLN
INTRAMUSCULAR | Status: DC | PRN
  Administered 2013-10-08 (×5): 50 ug via INTRAVENOUS

## 2013-10-08 MED ORDER — SPIRONOLACTONE 25 MG PO TABS
25.0000 mg | ORAL_TABLET | Freq: Every day | ORAL | Status: DC
  Administered 2013-10-08 – 2013-10-09 (×2): 25 mg via ORAL
  Filled 2013-10-08 (×3): qty 1

## 2013-10-08 MED ORDER — LIDOCAINE HCL (CARDIAC) 20 MG/ML IV SOLN
INTRAVENOUS | Status: AC
  Filled 2013-10-08: qty 5

## 2013-10-08 MED ORDER — EPHEDRINE SULFATE 50 MG/ML IJ SOLN
INTRAMUSCULAR | Status: AC
  Filled 2013-10-08: qty 1

## 2013-10-08 MED ORDER — ONDANSETRON HCL 4 MG/2ML IJ SOLN: 4.0000 mg | Freq: Four times a day (QID) | INTRAMUSCULAR | Status: DC | PRN

## 2013-10-08 MED ORDER — DEXTROSE-NACL 5-0.45 % IV SOLN
INTRAVENOUS | Status: DC
  Administered 2013-10-08: 23:00:00 via INTRAVENOUS

## 2013-10-08 MED ORDER — DEXMEDETOMIDINE HCL 200 MCG/2ML IV SOLN
INTRAVENOUS | Status: DC | PRN
  Administered 2013-10-08 (×2): 2 ug via INTRAVENOUS
  Administered 2013-10-08: 4 ug via INTRAVENOUS
  Administered 2013-10-08: 2 ug via INTRAVENOUS

## 2013-10-08 MED ORDER — SODIUM CHLORIDE 0.9 % IV SOLN
INTRAVENOUS | Status: DC | PRN
  Administered 2013-10-08: 10:00:00

## 2013-10-08 MED ORDER — DEXAMETHASONE 4 MG PO TABS
10.0000 mg | ORAL_TABLET | Freq: Three times a day (TID) | ORAL | Status: AC
  Administered 2013-10-08 – 2013-10-09 (×3): 10 mg via ORAL
  Filled 2013-10-08 (×3): qty 1

## 2013-10-08 MED ORDER — ONDANSETRON HCL 4 MG/2ML IJ SOLN
INTRAMUSCULAR | Status: DC | PRN
  Administered 2013-10-08: 4 mg via INTRAVENOUS

## 2013-10-08 MED ORDER — LISINOPRIL 40 MG PO TABS
40.0000 mg | ORAL_TABLET | Freq: Every day | ORAL | Status: DC
  Administered 2013-10-08 – 2013-10-10 (×3): 40 mg via ORAL
  Filled 2013-10-08 (×3): qty 1

## 2013-10-08 MED ORDER — ROCURONIUM BROMIDE 100 MG/10ML IV SOLN
INTRAVENOUS | Status: DC | PRN
  Administered 2013-10-08: 10 mg via INTRAVENOUS
  Administered 2013-10-08: 50 mg via INTRAVENOUS
  Administered 2013-10-08: 20 mg via INTRAVENOUS

## 2013-10-08 MED ORDER — MORPHINE SULFATE 2 MG/ML IJ SOLN
2.0000 mg | INTRAMUSCULAR | Status: DC | PRN
  Administered 2013-10-08 – 2013-10-09 (×4): 2 mg via INTRAVENOUS
  Filled 2013-10-08 (×4): qty 1

## 2013-10-08 MED ORDER — ARTIFICIAL TEARS OP OINT
TOPICAL_OINTMENT | OPHTHALMIC | Status: AC
  Filled 2013-10-08: qty 3.5

## 2013-10-08 MED ORDER — SODIUM CHLORIDE 0.9 % IJ SOLN
INTRAMUSCULAR | Status: AC
  Filled 2013-10-08: qty 10

## 2013-10-08 MED ORDER — FENTANYL CITRATE 0.05 MG/ML IJ SOLN
25.0000 ug | INTRAMUSCULAR | Status: DC | PRN
  Administered 2013-10-08: 25 ug via INTRAVENOUS

## 2013-10-08 MED ORDER — CHLORHEXIDINE GLUCONATE 4 % EX LIQD
60.0000 mL | Freq: Once | CUTANEOUS | Status: DC
  Filled 2013-10-08: qty 60

## 2013-10-08 SURGICAL SUPPLY — 61 items
BLADE SAGITTAL 25.0X1.27X90 (BLADE) ×2 IMPLANT
BLADE SAW SGTL 18X1.27X75 (BLADE) ×2 IMPLANT
BLADE SURG ROTATE 9660 (MISCELLANEOUS) IMPLANT
CLSR STERI-STRIP ANTIMIC 1/2X4 (GAUZE/BANDAGES/DRESSINGS) ×2 IMPLANT
COVER SURGICAL LIGHT HANDLE (MISCELLANEOUS) ×2 IMPLANT
DRAPE C-ARM 42X72 X-RAY (DRAPES) ×2 IMPLANT
DRAPE IMP U-DRAPE 54X76 (DRAPES) ×2 IMPLANT
DRAPE INCISE IOBAN 66X45 STRL (DRAPES) ×2 IMPLANT
DRAPE ORTHO SPLIT 77X108 STRL (DRAPES) ×2
DRAPE PROXIMA HALF (DRAPES) ×2 IMPLANT
DRAPE SURG 17X23 STRL (DRAPES) ×2 IMPLANT
DRAPE SURG ORHT 6 SPLT 77X108 (DRAPES) ×2 IMPLANT
DRAPE U-SHAPE 47X51 STRL (DRAPES) ×4 IMPLANT
DRSG AQUACEL AG ADV 3.5X10 (GAUZE/BANDAGES/DRESSINGS) ×2 IMPLANT
DURAPREP 26ML APPLICATOR (WOUND CARE) ×2 IMPLANT
ELECT BLADE 4.0 EZ CLEAN MEGAD (MISCELLANEOUS) ×2
ELECT CAUTERY BLADE 6.4 (BLADE) ×2 IMPLANT
ELECT REM PT RETURN 9FT ADLT (ELECTROSURGICAL) ×2
ELECTRODE BLDE 4.0 EZ CLN MEGD (MISCELLANEOUS) ×1 IMPLANT
ELECTRODE REM PT RTRN 9FT ADLT (ELECTROSURGICAL) ×1 IMPLANT
FACESHIELD WRAPAROUND (MASK) ×4 IMPLANT
GLOVE BIO SURGEON STRL SZ7.5 (GLOVE) ×2 IMPLANT
GLOVE BIOGEL M 7.0 STRL (GLOVE) IMPLANT
GLOVE BIOGEL PI IND STRL 7.0 (GLOVE) ×2 IMPLANT
GLOVE BIOGEL PI IND STRL 7.5 (GLOVE) ×2 IMPLANT
GLOVE BIOGEL PI IND STRL 8 (GLOVE) ×3 IMPLANT
GLOVE BIOGEL PI INDICATOR 7.0 (GLOVE) ×2
GLOVE BIOGEL PI INDICATOR 7.5 (GLOVE) ×2
GLOVE BIOGEL PI INDICATOR 8 (GLOVE) ×3
GLOVE BIOGEL PI ORTHO PRO SZ8 (GLOVE)
GLOVE PI ORTHO PRO STRL SZ8 (GLOVE) IMPLANT
GLOVE SURG ORTHO 8.0 STRL STRW (GLOVE) ×2 IMPLANT
GLOVE SURG SS PI 6.5 STRL IVOR (GLOVE) ×4 IMPLANT
GLOVE SURG SS PI 7.0 STRL IVOR (GLOVE) ×2 IMPLANT
GOWN STRL REUS W/ TWL LRG LVL3 (GOWN DISPOSABLE) ×3 IMPLANT
GOWN STRL REUS W/ TWL XL LVL3 (GOWN DISPOSABLE) ×1 IMPLANT
GOWN STRL REUS W/TWL LRG LVL3 (GOWN DISPOSABLE) ×3
GOWN STRL REUS W/TWL XL LVL3 (GOWN DISPOSABLE) ×1
HIP/CERM HD VIT E LINR LEV 1C ×2 IMPLANT
KIT BASIN OR (CUSTOM PROCEDURE TRAY) ×2 IMPLANT
KIT ROOM TURNOVER OR (KITS) ×2 IMPLANT
MANIFOLD NEPTUNE II (INSTRUMENTS) ×2 IMPLANT
NDL SAFETY ECLIPSE 18X1.5 (NEEDLE) ×1 IMPLANT
NEEDLE 18GX1X1/2 (RX/OR ONLY) (NEEDLE) ×2 IMPLANT
NEEDLE HYPO 18GX1.5 SHARP (NEEDLE) ×1
NS IRRIG 1000ML POUR BTL (IV SOLUTION) ×2 IMPLANT
PACK TOTAL JOINT (CUSTOM PROCEDURE TRAY) ×2 IMPLANT
PAD ARMBOARD 7.5X6 YLW CONV (MISCELLANEOUS) ×4 IMPLANT
SPONGE LAP 18X18 X RAY DECT (DISPOSABLE) IMPLANT
SUT MNCRL AB 4-0 PS2 18 (SUTURE) ×2 IMPLANT
SUT MON AB 2-0 CT1 36 (SUTURE) ×2 IMPLANT
SUT VIC AB 0 CT1 27 (SUTURE) ×1
SUT VIC AB 0 CT1 27XBRD ANBCTR (SUTURE) ×1 IMPLANT
SUT VIC AB 1 CT1 27 (SUTURE) ×1
SUT VIC AB 1 CT1 27XBRD ANBCTR (SUTURE) ×1 IMPLANT
SYR 50ML LL SCALE MARK (SYRINGE) ×2 IMPLANT
TOWEL OR 17X24 6PK STRL BLUE (TOWEL DISPOSABLE) ×2 IMPLANT
TOWEL OR 17X26 10 PK STRL BLUE (TOWEL DISPOSABLE) ×2 IMPLANT
TOWEL OR NON WOVEN STRL DISP B (DISPOSABLE) ×2 IMPLANT
TRAY FOLEY CATH 16FRSI W/METER (SET/KITS/TRAYS/PACK) IMPLANT
WATER STERILE IRR 1000ML POUR (IV SOLUTION) ×2 IMPLANT

## 2013-10-08 NOTE — Anesthesia Preprocedure Evaluation (Addendum)
Anesthesia Evaluation   Patient awake    Reviewed: Allergy & Precautions, H&P , NPO status , Patient's Chart, lab work & pertinent test results, reviewed documented beta blocker date and time   Airway Mallampati: II  Neck ROM: full    Dental  (+) Missing   Pulmonary shortness of breath, asthma ,          Cardiovascular hypertension, + Peripheral Vascular Disease and +CHF     Neuro/Psych  Headaches, Hx Brain aneurysm CVA, No Residual Symptoms    GI/Hepatic (+)     substance abuse  ,   Endo/Other    Renal/GU Renal disease     Musculoskeletal  (+) Arthritis -, Osteoarthritis,    Abdominal   Peds  Hematology  (+) Sickle cell trait ,   Anesthesia Other Findings   Reproductive/Obstetrics                       Anesthesia Physical Anesthesia Plan  ASA: III  Anesthesia Plan:    Post-op Pain Management:    Induction:   Airway Management Planned:   Additional Equipment:   Intra-op Plan:   Post-operative Plan:   Informed Consent: I have reviewed the patients History and Physical, chart, labs and discussed the procedure including the risks, benefits and alternatives for the proposed anesthesia with the patient or authorized representative who has indicated his/her understanding and acceptance.   Dental Advisory Given  Plan Discussed with: Anesthesiologist, CRNA and Surgeon  Anesthesia Plan Comments:         Anesthesia Quick Evaluation

## 2013-10-08 NOTE — Evaluation (Signed)
Physical Therapy Evaluation Patient Details Name: [name] [name] MRN: [id number] DOB: November 25, 1950 Today's Date: 10/08/2013   History of Present Illness  63 y.o. male admitted to [facility] on 10/08/13 for elective L direct anterior THA.  Pt has significant PMHx of HTN, systolic heart failure, SOB, sickle cell trait, stroke, arthritis (R knee), gout, CHF, and brain aneurysm.    Clinical Impression  Pt is POD #0 and is progress well with his mobility.  He is min assist overall and was even able to walk a few steps with RW tonight.  I anticipate that he will progress well enough to return home with his wife's assist and HHPT at discharge.   PT to follow acutely for deficits listed below.       Follow Up Recommendations Home health PT;Supervision for mobility/OOB    Equipment Recommendations  Other (comment) (RW and 3-in-1 being delivered to his home)    Recommendations for Other Services   NA    Precautions / Restrictions Restrictions Weight Bearing Restrictions: Yes LLE Weight Bearing: Weight bearing as tolerated      Mobility  Bed Mobility Overal bed mobility: Needs Assistance Bed Mobility: Supine to Sit     Supine to sit: Min assist     General bed mobility comments: Min assist to help progress his left leg over the side of the bed.  Verbal cues for sequencing and hand placement.   Transfers Overall transfer level: Needs assistance Equipment used: Rolling walker (2 wheeled) Transfers: Sit to/from Stand Sit to Stand: Min assist;From elevated surface         General transfer comment: Min assist from elevated bed to support trunk during transition and stabilize RW.  Verbal cues for safe hand placement.   Ambulation/Gait Ambulation/Gait assistance: Min assist Ambulation Distance (Feet): 8 Feet Assistive device: Rolling walker (2 wheeled) Gait Pattern/deviations: Step-to pattern;Antalgic Gait velocity: decreased Gait velocity interpretation: Below normal speed for  age/gender General Gait Details: Min assist to support trunk when WB over painful left leg.  Verbal cues for correct RW use and LE sequencing during gait.          Balance Overall balance assessment: Needs assistance Sitting-balance support: Feet supported;No upper extremity supported Sitting balance-Leahy Scale: Good     Standing balance support: Bilateral upper extremity supported Standing balance-Leahy Scale: Poor Standing balance comment: needs external support to stand due to pain                              Pertinent Vitals/Pain Pain Assessment: 0-10 Pain Score: 6  Pain Location: left hip Pain Descriptors / Indicators: Aching;Burning Pain Intervention(s): Limited activity within patient's tolerance;Monitored during session;Repositioned;Ice applied    Home Living Family/patient expects to be discharged to:: Private residence Living Arrangements: Spouse/significant other Available Help at Discharge: Family;Available 24 hours/day Type of Home: House Home Access: Stairs to enter Entrance Stairs-Rails: None Entrance Stairs-Number of Steps: 2 Home Layout: One level Home Equipment: Other (comment);Cane - single point (pt reports they are delivering a RW and BSC to his home)      Prior Function Level of Independence: Independent with assistive device(s)         Comments: used a cane PTA for gait        Extremity/Trunk Assessment   Upper Extremity Assessment: Defer to OT evaluation           Lower Extremity Assessment: RLE deficits/detail;LLE deficits/detail RLE Deficits / Details: right leg with visible  knee OA, but pt reports it is manageable,  It did not seem to affect his ability to mobilize with me today.  LLE Deficits / Details: normal post op pain and weakness.  Ankle 3/4, knee 3/5, hip 2+/5  Cervical / Trunk Assessment: Normal  Communication   Communication: No difficulties  Cognition Arousal/Alertness: Lethargic;Suspect due to  medications Behavior During Therapy: [facility] for tasks assessed/performed Overall Cognitive Status: Within Functional Limits for tasks assessed                         Exercises Total Joint Exercises Ankle Circles/Pumps: AROM;Both;10 reps;Supine (supine in recliner chair) Quad Sets: AROM;5 reps;Left;Supine Heel Slides: AAROM;Left;5 reps;Supine      Assessment/Plan    PT Assessment Patient needs continued PT services  PT Diagnosis Difficulty walking;Abnormality of gait;Generalized weakness;Acute pain   PT Problem List Decreased strength;Decreased activity tolerance;Decreased balance;Decreased mobility;Decreased knowledge of use of DME;Pain  PT Treatment Interventions DME instruction;Gait training;Stair training;Functional mobility training;Therapeutic activities;Therapeutic exercise;Neuromuscular re-education;Balance training;Patient/family education;Modalities   PT Goals (Current goals can be found in the Care Plan section) Acute Rehab PT Goals Patient Stated Goal: to walk without an assistive device again, decrease pain PT Goal Formulation: With patient Time For Goal Achievement: 10/15/13 Potential to Achieve Goals: Good    Frequency 7X/week    End of Session Equipment Utilized During Treatment: Gait belt Activity Tolerance: Patient limited by pain;Patient limited by fatigue Patient left: in chair;with call bell/phone within reach;with family/visitor present           Time: 0355-9741 PT Time Calculation (min): 32 min   Charges:   PT Evaluation $Initial PT Evaluation Tier I: 1 Procedure PT Treatments $Therapeutic Activity: 8-22 mins        Reagan Behlke B. Porter, [place], DPT [phone]   10/08/2013, 5:22 PM

## 2013-10-08 NOTE — Progress Notes (Signed)
Utilization review completed.  

## 2013-10-08 NOTE — Plan of Care (Signed)
Problem: Consults Goal: Diagnosis- Total Joint Replacement Primary Total Hip Left     

## 2013-10-08 NOTE — Progress Notes (Signed)
This note also relates to the following rows which could not be included: Pulse Rate - Cannot attach notes to unvalidated device data ECG Heart Rate - Cannot attach notes to unvalidated device data SpO2 - Cannot attach notes to family to room per joann/vol

## 2013-10-08 NOTE — Transfer of Care (Signed)
Immediate Anesthesia Transfer of Care Note  Patient: [name] [name]  Procedure(s) Performed: Procedure(s): TOTAL HIP ARTHROPLASTY ANTERIOR APPROACH (Left)  Patient Location: PACU  Anesthesia Type:General  Level of Consciousness: awake, alert , oriented and sedated  Airway & Oxygen Therapy: Patient Spontanous Breathing and Patient connected to nasal cannula oxygen  Post-op Assessment: Report given to PACU RN, Post -op Vital signs reviewed and stable and Patient moving all extremities  Post vital signs: Reviewed and stable  Complications: No apparent anesthesia complications

## 2013-10-08 NOTE — Progress Notes (Signed)
Patient states he is allergic to vicodin. Medication makes him feel "loopy and dizzy". On call PA notified to change pain medication. Awaiting call back.

## 2013-10-08 NOTE — Interval H&P Note (Signed)
History and Physical Interval Note:  10/08/2013 7:31 AM  [name]  has presented today for surgery, with the diagnosis of OA LEFT HIP  The various methods of treatment have been discussed with the patient and family. After consideration of risks, benefits and other options for treatment, the patient has consented to  Procedure(s): TOTAL HIP ARTHROPLASTY ANTERIOR APPROACH (Left) as a surgical intervention .  The patient's history has been reviewed, patient examined, no change in status, stable for surgery.  I have reviewed the patient's chart and labs.  Questions were answered to the patient's satisfaction.     Breniya Goertzen, D

## 2013-10-08 NOTE — Anesthesia Postprocedure Evaluation (Signed)
  Anesthesia Post-op Note  Patient: [name] [name]  Procedure(s) Performed: Procedure(s): TOTAL HIP ARTHROPLASTY ANTERIOR APPROACH (Left)  Patient Location: PACU  Anesthesia Type:General  Level of Consciousness: awake and alert   Airway and Oxygen Therapy: Patient Spontanous Breathing  Post-op Pain: mild  Post-op Assessment: Post-op Vital signs reviewed  Post-op Vital Signs: Reviewed and stable  Last Vitals:  Filed Vitals:   10/08/13 1028  BP: 125/71  Pulse: 56  Temp: 36.6 C  Resp: 9    Complications: No apparent anesthesia complications

## 2013-10-08 NOTE — Anesthesia Procedure Notes (Signed)
Procedure Name: Intubation Date/Time: 10/08/2013 7:46 AM Performed by: Scheryl Darter Pre-anesthesia Checklist: Patient identified, Emergency Drugs available, Suction available, Patient being monitored and Timeout performed Patient Re-evaluated:Patient Re-evaluated prior to inductionPreoxygenation: Pre-oxygenation with 100% oxygen Intubation Type: IV induction Ventilation: Mask ventilation without difficulty Laryngoscope Size: Mac and 4 Grade View: Grade I Tube type: Oral Tube size: 8.0 mm Number of attempts: 1 Airway Equipment and Method: Stylet Placement Confirmation: ETT inserted through vocal cords under direct vision,  positive ETCO2 and breath sounds checked- equal and bilateral Secured at: 24 cm Tube secured with: Tape Dental Injury: Teeth and Oropharynx as per pre-operative assessment

## 2013-10-08 NOTE — Op Note (Signed)
10/08/2013  9:42 AM  PATIENT:  [name] [name]   MRN: [id number]  PRE-OPERATIVE DIAGNOSIS:  OA LEFT HIP  POST-OPERATIVE DIAGNOSIS:  OA LEFT HIP  PROCEDURE:  Procedure(s): TOTAL HIP ARTHROPLASTY ANTERIOR APPROACH  PREOPERATIVE INDICATIONS:    [name] [name] is an 63 y.o. male who has a diagnosis of <principal problem not specified> and elected for surgical management after failing conservative treatment.  The risks benefits and alternatives were discussed with the patient including but not limited to the risks of nonoperative treatment, versus surgical intervention including infection, bleeding, nerve injury, periprosthetic fracture, the need for revision surgery, dislocation, leg length discrepancy, blood clots, cardiopulmonary complications, morbidity, mortality, among others, and they were willing to proceed.     OPERATIVE REPORT     SURGEON:   Renette Butters, MD    ASSISTANT:  Joya Gaskins, OPA, He was necessary for efficiency and safety of the case.     ANESTHESIA:  General    COMPLICATIONS:  None.     COMPONENTS:  Stryker acolade fit femur size 4 with a 36 mm +0 head ball and a PSL acetabular shell size 58 with a  polyethylene liner    PROCEDURE IN DETAIL:   The patient was met in the holding area and  identified.  The appropriate hip was identified and marked at the operative site.  The patient was then transported to the OR  and  placed under general anesthesia.  At that point, the patient was  placed in the supine position and  secured to the operating room table and all bony prominences padded. He received pre-operative antibiotics    The operative lower extremity was prepped from the iliac crest to the distal leg.  Sterile draping was performed.  Time out was performed prior to incision.      Skin incision was made just 2 cm lateral to the ASIS  extending in line with the tensor fascia lata. Electrocautery was used to control all bleeders. I dissected down sharply  to the fascia of the tensor fascia lata was confirmed that the muscle fibers beneath were running posteriorly. I then incised the fascia over the superficial tensor fascia lata in line with the incision. The fascia was elevated off the anterior aspect of the muscle the muscle was retracted posteriorly and protected throughout the case. I then used electrocautery to incise the tensor fascia lata fascia control and all bleeders. Immediately visible was the fat over top of the anterior neck and capsule.  I removed the anterior fat from the capsule and elevated the rectus muscle off of the anterior capsule. I then removed a large time of capsule. The retractors were then placed over the anterior acetabulum as well as around the superior and inferior neck.  I then removed a section of the femoral neck and a napkin ring fashion. Then used the power course to remove the femoral head from the acetabulum and thoroughly irrigated the acetabulum. I sized the femoral head.    I then exposed the deep acetabulum, cleared out any tissue including the ligamentum teres.   After adequate visualization, I excised the labrum, and then sequentially reamed.  I placed the trial acetabulum, which seated nicely, and then impacted the real cup into place.  Appropriate version and inclination was confirmed clinically matching their bony anatomy, and also with the use transverse acetabular ligament. I then placed the polyethylene liner in place  I then abducted the leg and released the external rotators from the posterior femur  allowing it to be easily delivered up lateral and anterior to the acetabulum for preparation of the femoral canal.    I then prepared the proximal femur using the cookie-cutter and then sequentially reamed and broached.  A trial broach, neck, and head was utilized, and I reduced the hip and it was found to have excellent stability with functional range of motion..  I then impacted the real femoral  prosthesis into place into the appropriate version, slightly anteverted to the normal anatomy, and I impacted the real head ball into place. The hip was then reduced and taken through functional range of motion and found to have excellent stability. Leg lengths were restored.  I then irrigated the hip copiously again with, and repaired the fascia with Vicryl, followed by monocryl for the subcutaneous tissue, Monocryl for the skin, Steri-Strips and sterile gauze. The wounds were injected. The patient was then awakened and returned to PACU in stable and satisfactory condition. There were no complications.  POST OPERATIVE PLAN: WBAT, DVT px: SCD's/TED and ASA 325  [name] Lynch, MD Orthopedic Surgeon [phone]   This note was generated using a template and dragon dictation system. In light of that, I have reviewed the note and all aspects of it are applicable to this case. Any dictation errors are due to the computerized dictation system.

## 2013-10-09 ENCOUNTER — Encounter (HOSPITAL_COMMUNITY): Payer: Self-pay | Admitting: General Practice

## 2013-10-09 MED ORDER — HYDROMORPHONE HCL 2 MG PO TABS
1.0000 mg | ORAL_TABLET | ORAL | Status: DC | PRN
  Administered 2013-10-09 – 2013-10-10 (×2): 2 mg via ORAL
  Filled 2013-10-09 (×2): qty 1

## 2013-10-09 MED ORDER — INFLUENZA VAC SPLIT QUAD 0.5 ML IM SUSY
0.5000 mL | PREFILLED_SYRINGE | Freq: Once | INTRAMUSCULAR | Status: AC
  Administered 2013-10-09: 0.5 mL via INTRAMUSCULAR
  Filled 2013-10-09: qty 0.5

## 2013-10-09 NOTE — Evaluation (Signed)
Occupational Therapy Evaluation Patient Details Name: [name] [name] MRN: [id number] DOB: May 13, 1950 Today's Date: 10/09/2013    History of Present Illness 63 y.o. male admitted to [facility] on 10/08/13 for elective L direct anterior THA.  Pt has significant PMHx of HTN, systolic heart failure, SOB, sickle cell trait, stroke, arthritis (R knee), gout, CHF, and brain aneurysm.     Clinical Impression   Pt admitted with the above diagnoses and presents with below problem list. Pt will benefit from continued acute OT to address the below listed deficits and maximize independence with basic BADLs prior to d/c home with family. PTA pt was mod I. Pt currently at min guard level for LB ADLs, transfers, and ambulation. Spouse works during the day and requesting information on outside agencies to assist during day; notified CM.     Follow Up Recommendations  Other (comment);No OT follow up (Supervision for OOB/mobility)    Equipment Recommendations  3 in 1 bedside comode    Recommendations for Other Services       Precautions / Restrictions Restrictions Weight Bearing Restrictions: Yes LLE Weight Bearing: Weight bearing as tolerated      Mobility Bed Mobility               General bed mobility comments: pt in recliner  Transfers Overall transfer level: Needs assistance Equipment used: Rolling walker (2 wheeled) Transfers: Sit to/from Stand Sit to Stand: Supervision         General transfer comment: supervision for safety    Balance Overall balance assessment: Needs assistance Sitting-balance support: No upper extremity supported;Feet supported Sitting balance-Leahy Scale: Good     Standing balance support: Bilateral upper extremity supported;During functional activity Standing balance-Leahy Scale: Poor Standing balance comment: pt relies on rw for standing balance                            ADL Overall ADL's : Needs assistance/impaired Eating/Feeding: Set  up;Sitting   Grooming: Set up;Sitting   Upper Body Bathing: Set up;Sitting   Lower Body Bathing: Min guard;With adaptive equipment;Sit to/from stand   Upper Body Dressing : Set up;Sitting   Lower Body Dressing: Min guard;Sit to/from stand Lower Body Dressing Details (indicate cue type and reason): pt reports he donned underwear in sitting with no assistance Toilet Transfer: Min guard;Ambulation;BSC;RW   Toileting- Water quality scientist and Hygiene: Min guard;Sit to/from stand   Tub/ Shower Transfer: Min guard;Ambulation;3 in 1;Rolling walker   Functional mobility during ADLs: Min guard;Rolling walker General ADL Comments: Educated pt on techniques and AE for safe completion of ADLs at home. Spouse concerned that seated surfaces at home are too low. Provided spouse with contact information on local medical supply store to explore options for elevated seating addition.      Vision                     Perception     Praxis      Pertinent Vitals/Pain Pain Assessment: 0-10 Pain Score: 7  Pain Location: L hip Pain Descriptors / Indicators: Aching;Burning Pain Intervention(s): Limited activity within patient's tolerance;Monitored during session;Repositioned;Patient requesting pain meds-RN notified;Ice applied     Hand Dominance     Extremity/Trunk Assessment Upper Extremity Assessment Upper Extremity Assessment: Overall WFL for tasks assessed   Lower Extremity Assessment Lower Extremity Assessment: Defer to PT evaluation   Cervical / Trunk Assessment Cervical / Trunk Assessment: Normal   Communication Communication Communication: No difficulties  Cognition Arousal/Alertness: Awake/alert Behavior During Therapy: WFL for tasks assessed/performed Overall Cognitive Status: Within Functional Limits for tasks assessed                     General Comments       Exercises       Shoulder Instructions      Home Living Family/patient expects to be  discharged to:: Private residence Living Arrangements: Spouse/significant other Available Help at Discharge: Family;Available 24 hours/day Type of Home: House Home Access: Stairs to enter [employer] of Steps: 2 Entrance Stairs-Rails: None Home Layout: One level     Bathroom Shower/Tub: Teacher, early years/pre: Standard Bathroom Accessibility: Yes (sidestepping required in bathroom) How Accessible: Accessible via walker Home Equipment: Clutier - single point;Bedside commode   Additional Comments: BSC delivered to room for home use appears to be too narrow; informed CM who has made call to [employer]      Prior Functioning/Environment Level of Independence: Independent with assistive device(s)        Comments: used a cane PTA for gait    OT Diagnosis: Acute pain   OT Problem List: Impaired balance (sitting and/or standing);Decreased knowledge of use of DME or AE;Decreased knowledge of precautions;Pain   OT Treatment/Interventions: Self-care/ADL training;Therapeutic exercise;DME and/or AE instruction;Therapeutic activities;Patient/family education;Balance training    OT Goals(Current goals can be found in the care plan section) Acute Rehab OT Goals Patient Stated Goal: not stated OT Goal Formulation: With patient/family Time For Goal Achievement: 10/16/13 Potential to Achieve Goals: Good ADL Goals Pt Will Perform Lower Body Bathing: with modified independence;sit to/from stand;with adaptive equipment Pt Will Perform Lower Body Dressing: with modified independence;with adaptive equipment;sit to/from stand Pt Will Transfer to Toilet: with modified independence;ambulating;bedside commode Pt Will Perform Toileting - Clothing Manipulation and hygiene: with modified independence;sit to/from stand Pt Will Perform Tub/Shower Transfer: with modified independence;ambulating;3 in 1;rolling walker  OT Frequency: Min 2X/week   Barriers to D/C:    spouse works during the  day and inquired about options for outside help with providing assistance during day. Spoke with CM who will f/u with list of agencies.       Co-evaluation              End of Session Equipment Utilized During Treatment: Gait belt;Rolling walker Nurse Communication: Patient requests pain meds  Activity Tolerance: Patient tolerated treatment well Patient left: in chair;with call bell/phone within reach;with family/visitor present   Time: 1450-1521 OT Time Calculation (min): 31 min Charges:  OT General Charges $OT Visit: 1 Procedure OT Evaluation $Initial OT Evaluation Tier I: 1 Procedure OT Treatments $Self Care/Home Management : 23-37 mins G-Codes:    Hortencia Pilar 10-26-2013, 3:48 PM

## 2013-10-09 NOTE — Progress Notes (Signed)
Physical Therapy Treatment Patient Details Name: Malike Foglio MRN: [id number] DOB: [birth year] Today's Date: 10/09/2013    History of Present Illness 63 y.o. male admitted to [facility] on 10/08/13 for elective L direct anterior THA.  Pt has significant PMHx of HTN, systolic heart failure, SOB, sickle cell trait, stroke, arthritis (R knee), gout, CHF, and brain aneurysm.      PT Comments    Patient progressing well with mobility. Will plan on attempting stair training this afternoon. At this point patient stated that he does not feel ready to DC home today.   Follow Up Recommendations  Home health PT;Supervision for mobility/OOB     Equipment Recommendations       Recommendations for Other Services       Precautions / Restrictions Restrictions Weight Bearing Restrictions: Yes LLE Weight Bearing: Weight bearing as tolerated    Mobility  Bed Mobility   Bed Mobility: Sit to Supine       Sit to supine: Min assist   General bed mobility comments: Min A with LEs back into bed and cues for positioning  Transfers Overall transfer level: Needs assistance Equipment used: Rolling walker (2 wheeled)   Sit to Stand: Min guard         General transfer comment: Cues for safe hand placement. Minguard for safety  Ambulation/Gait Ambulation/Gait assistance: Min guard Ambulation Distance (Feet): 150 Feet Assistive device: Rolling walker (2 wheeled) Gait Pattern/deviations: Step-to pattern Gait velocity: decreased   General Gait Details: MinGuard for safety. Cues for gait sequence and safe use of RW   Stairs            Wheelchair Mobility    Modified Rankin (Stroke Patients Only)       Balance                                    Cognition Arousal/Alertness: Awake/alert Behavior During Therapy: WFL for tasks assessed/performed Overall Cognitive Status: Within Functional Limits for tasks assessed                      Exercises Total Joint  Exercises Quad Sets: AROM;Left;Supine;10 reps Heel Slides: AAROM;Left;Supine;10 reps Hip ABduction/ADduction: AAROM;Left;10 reps;Supine    General Comments        Pertinent Vitals/Pain Pain Score: 6  Pain Location: L hip Pain Descriptors / Indicators: Aching;Burning Pain Intervention(s): Monitored during session    Home Living                      Prior Function            PT Goals (current goals can now be found in the care plan section) Progress towards PT goals: Progressing toward goals    Frequency  7X/week    PT Plan Current plan remains appropriate    Co-evaluation             End of Session Equipment Utilized During Treatment: Gait belt Activity Tolerance: Patient tolerated treatment well Patient left: in bed;with call bell/phone within reach     Time: 0909-0933 PT Time Calculation (min): 24 min  Charges:  $Gait Training: 8-22 mins $Therapeutic Exercise: 8-22 mins                    G Codes:      Jacqualyn Posey 10/09/2013, 9:39 AM 10/09/2013 Jacqualyn Posey PTA [phone] pager [phone] office

## 2013-10-09 NOTE — Discharge Summary (Signed)
Physician Discharge Summary  Patient ID: [name] [name] MRN: [id number] DOB/AGE: 61/24/1952 61 y.o.  Admit date: 10/08/2013 Discharge date: 10/09/2013  Admission Diagnoses:  <principal problem not specified>  Discharge Diagnoses:  Active Problems:   DJD (degenerative joint disease)   Past Medical History  Diagnosis Date  . Hypertension   . Systolic heart failure     EF is 35 to 40%  . Asthma     'as a child"   . Shortness of breath     "comes on at anytime" (01/18/2013)  . Sickle cell trait   . Stroke 12/1988  . CHF (congestive heart failure)   . Blood clotting disorder     pt. states his family has a history of "slow clotting", had bleeding after oral surgery age 80 but none with extractions 2015 or adult surgeries; thought father (now deceased) had a hx of hemophilia  . History of gout   . Daily headache     are due to the medication he is taking  . Brain aneurysm 01/11/1989  . Arthritis     "left hip; right knee" (10/09/2013)    Surgeries: Procedure(s): TOTAL HIP ARTHROPLASTY ANTERIOR APPROACH on 10/08/2013   Consultants (if any):    Discharged Condition: Improved  Hospital Course: [name] [name] is an 63 y.o. male who was admitted 10/08/2013 with a diagnosis of <principal problem not specified> and went to the operating room on 10/08/2013 and underwent the above named procedures.    He was given perioperative antibiotics:  Anti-infectives   Start     Dose/Rate Route Frequency Ordered Stop   10/08/13 1400  ceFAZolin (ANCEF) IVPB 2 g/50 mL premix     2 g 100 mL/hr over 30 Minutes Intravenous Every 6 hours 10/08/13 1301 10/08/13 2301   10/08/13 0600  ceFAZolin (ANCEF) IVPB 2 g/50 mL premix     2 g 100 mL/hr over 30 Minutes Intravenous On call to O.R. 10/07/13 1415 10/08/13 0752   10/08/13 0550  ceFAZolin (ANCEF) IVPB 2 g/50 mL premix  Status:  Discontinued     2 g 100 mL/hr over 30 Minutes Intravenous On call to O.R. 10/08/13 5784 10/08/13 0551    .  He was  given sequential compression devices, early ambulation, and ASA 325 for DVT prophylaxis.  He benefited maximally from the hospital stay and there were no complications.    Recent vital signs:  Filed Vitals:   10/09/13 0700  BP: 120/82  Pulse: 69  Temp: 97.8 F (36.6 C)  Resp: 18    Recent laboratory studies:  Lab Results  Component Value Date   HGB 14.1 09/27/2013   HGB 12.7* 02/28/2013   HGB 12.2* 01/23/2013   Lab Results  Component Value Date   WBC 8.5 09/27/2013   PLT 227 09/27/2013   Lab Results  Component Value Date   INR 1.09 09/27/2013   Lab Results  Component Value Date   NA 142 09/27/2013   K 4.3 09/27/2013   CL 103 09/27/2013   CO2 29 09/27/2013   BUN 22 09/27/2013   CREATININE 1.35 09/27/2013   GLUCOSE 87 09/27/2013    Discharge Medications:     Medication List         acetaminophen 500 MG tablet  Commonly known as:  TYLENOL  Take 1,000 mg by mouth 2 (two) times daily as needed for moderate pain.     aspirin EC 325 MG tablet  Take 1 tablet (325 mg total) by mouth daily.  carvedilol 25 MG tablet  Commonly known as:  COREG  Take 1 tablet (25 mg total) by mouth 2 (two) times daily with a meal.     colchicine 0.6 MG tablet  Take 0.6 mg by mouth 2 (two) times daily.     docusate sodium 100 MG capsule  Commonly known as:  COLACE  Take 1 capsule (100 mg total) by mouth 2 (two) times daily. Continue this while taking narcotics to help with bowel movements     furosemide 40 MG tablet  Commonly known as:  LASIX  Take 1.5 tablets (60 mg total) by mouth 2 (two) times daily.     HYDROcodone-acetaminophen 5-325 MG per tablet  Commonly known as:  NORCO  Take 1-2 tablets by mouth every 4 (four) hours as needed for moderate pain.     isosorbide-hydrALAZINE 20-37.5 MG per tablet  Commonly known as:  BIDIL  Take 2 tablets by mouth 3 (three) times daily.     lisinopril 40 MG tablet  Commonly known as:  PRINIVIL,ZESTRIL  Take 1 tablet (40 mg total) by mouth daily.      Magnesium 250 MG Tabs  Take 250 mg by mouth daily.     meloxicam 15 MG tablet  Commonly known as:  MOBIC  Take 1 tablet (15 mg total) by mouth daily.     ondansetron 4 MG tablet  Commonly known as:  ZOFRAN  Take 1 tablet (4 mg total) by mouth every 8 (eight) hours as needed for nausea.     spironolactone 25 MG tablet  Commonly known as:  ALDACTONE  Take 25 mg by mouth daily.        Diagnostic Studies: Dg Pelvis Portable  10/08/2013   CLINICAL DATA:  Status post left hip replacement  EXAM: PORTABLE PELVIS 1-2 VIEWS  COMPARISON:  None.  FINDINGS: A left hip replacement is seen.  No acute abnormality is noted.   Electronically Signed   By: Inez Catalina M.D.   On: 10/08/2013 10:51    Disposition: 01-Home or Self Care      Discharge Instructions   Weight bearing as tolerated    Complete by:  As directed            Follow-up Information   Follow up with Renette Butters, MD. (as scheduled)    Specialty:  Orthopedic Surgery   Contact information:   [address] [place] [address] [phone]        Signed: Edmonia Lynch, D 10/09/2013, 8:18 AM

## 2013-10-09 NOTE — Progress Notes (Signed)
Physical Therapy Treatment Patient Details Name: [name] [name] MRN: [id number] DOB: 05-27-1950 Today's Date: 10/09/2013    History of Present Illness 63 y.o. male admitted to [facility] on 10/08/13 for elective L direct anterior THA.  Pt has significant PMHx of HTN, systolic heart failure, SOB, sickle cell trait, stroke, arthritis (R knee), gout, CHF, and brain aneurysm.      PT Comments    Patient progressing well. Able to increase ambulation and stair training this PM.  Follow Up Recommendations  Home health PT;Supervision for mobility/OOB     Equipment Recommendations       Recommendations for Other Services       Precautions / Restrictions Restrictions LLE Weight Bearing: Weight bearing as tolerated    Mobility  Bed Mobility               General bed mobility comments: Patient sitting EOB upon arrival  Transfers Overall transfer level: Needs assistance Equipment used: Rolling walker (2 wheeled)   Sit to Stand: Supervision         General transfer comment: supervision for safety  Ambulation/Gait Ambulation/Gait assistance: Supervision Ambulation Distance (Feet): 250 Feet Assistive device: Rolling walker (2 wheeled) Gait Pattern/deviations: Step-through pattern;Decreased stride length Gait velocity: decreased   General Gait Details: Patient with good safe technique. Cues to increase weight through LEs   Stairs Stairs: Yes Stairs assistance: Min assist Stair Management: Backwards;With walker;No rails;Step to pattern Number of Stairs: 2 General stair comments: Patient practiced two steps x2 with cues for sequency and technique  Wheelchair Mobility    Modified Rankin (Stroke Patients Only)       Balance                                    Cognition Arousal/Alertness: Awake/alert Behavior During Therapy: WFL for tasks assessed/performed Overall Cognitive Status: Within Functional Limits for tasks assessed                       Exercises      General Comments        Pertinent Vitals/Pain Pain Score: 3  Pain Location: L hip Pain Descriptors / Indicators: Aching Pain Intervention(s): Monitored during session    Home Living                      Prior Function            PT Goals (current goals can now be found in the care plan section) Progress towards PT goals: Progressing toward goals    Frequency  7X/week    PT Plan Current plan remains appropriate    Co-evaluation             End of Session Equipment Utilized During Treatment: Gait belt Activity Tolerance: Patient tolerated treatment well Patient left: in chair;with call bell/phone within reach     Time: 1421-1444 PT Time Calculation (min): 23 min  Charges:  $Gait Training: 8-22 mins $Therapeutic Exercise: 8-22 mins                    G Codes:      Jacqualyn Posey 10/09/2013, 2:47 PM  10/09/2013 Jacqualyn Posey PTA [phone] pager [phone] office

## 2013-10-09 NOTE — Progress Notes (Signed)
CARE MANAGEMENT NOTE 10/09/2013  Patient:  [name] [name],[name] [name]   Account Number:  [id number]  Date Initiated:  10/09/2013  Documentation initiated by:  [facility]  Subjective/Objective Assessment:   admitted s/p left THA     Action/Plan:   PT/OT evals- recommended HHPT   Anticipated DC Date:  10/09/2013   Anticipated DC Plan:  [place]  CM consult      PAC Choice  [place]   Choice offered to / List presented to:  C-1 Patient   DME arranged  3-N-1  [place]      DME agency  [employer]     [place] arranged  HH-2 PT      Rafael Hernandez   Status of service:  Completed, signed off Medicare Important Message given?   (If response is "NO", the following Medicare IM given date fields will be blank) Date Medicare IM given:   Medicare IM given by:   Date Additional Medicare IM given:   Additional Medicare IM given by:    Discharge Disposition:  [place]  Per UR Regulation:    If discussed at Long Length of Stay Meetings, dates discussed:    Comments:  10/09/13 Set up by MD office with [place] for HHPT and [employer] for rolling walker and 3N1. Rolling walker and 3N1 delivered to patient's room by [employer] [place]. Spoke with patient, no change in d/c plan. Confirmed with Stanton Kidney at [place] that patient set up for [place]. Fuller Plan RN, BSN, CCM
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
## 2013-10-09 NOTE — Discharge Instructions (Signed)
Keep your incision covered and dry until follow up  Bear weight as tolerated  Take Aspirin daily for 30 days  Take mobic daily for 30 days

## 2013-10-10 ENCOUNTER — Encounter (HOSPITAL_COMMUNITY): Payer: Self-pay | Admitting: Orthopedic Surgery

## 2013-10-10 NOTE — Progress Notes (Signed)
Physical Therapy Treatment Patient Details Name: [name] [name] MRN: [id number] DOB: [birth year] Today's Date: 10/10/2013    History of Present Illness 63 y.o. male admitted to [facility] on 10/08/13 for elective L direct anterior THA.  Pt has significant PMHx of HTN, systolic heart failure, SOB, sickle cell trait, stroke, arthritis (R knee), gout, CHF, and brain aneurysm.      PT Comments    Patient continues to make good progress with mobility. Patient safe to D/C from a mobility standpoint based on progression towards goals set on PT eval.    Follow Up Recommendations  Home health PT;Supervision for mobility/OOB     Equipment Recommendations       Recommendations for Other Services       Precautions / Restrictions Precautions Precautions: None Restrictions LLE Weight Bearing: Weight bearing as tolerated    Mobility  Bed Mobility                  Transfers Overall transfer level: Needs assistance Equipment used: Rolling walker (2 wheeled)   Sit to Stand: Supervision         General transfer comment: supervision for safety. slightly unsteady at first but no LOB  Ambulation/Gait Ambulation/Gait assistance: Modified independent (Device/Increase time) Ambulation Distance (Feet): 600 Feet Assistive device: Rolling walker (2 wheeled) Gait Pattern/deviations: Step-through pattern;Decreased stride length   Gait velocity interpretation: Below normal speed for age/gender General Gait Details: Patient with safe technique. Decreased cadence.    Stairs         General stair comments: verbalized stairs. declined further practice  Wheelchair Mobility    Modified Rankin (Stroke Patients Only)       Balance                                    Cognition Arousal/Alertness: Awake/alert Behavior During Therapy: WFL for tasks assessed/performed Overall Cognitive Status: Within Functional Limits for tasks assessed                       Exercises Total Joint Exercises Long Arc Quad: AROM;Left;10 reps    General Comments        Pertinent Vitals/Pain Pain Score: 4  Pain Location: L hip Pain Descriptors / Indicators: Aching Pain Intervention(s): Limited activity within patient's tolerance;Monitored during session    Home Living                      Prior Function            PT Goals (current goals can now be found in the care plan section) Progress towards PT goals: Progressing toward goals    Frequency  7X/week    PT Plan Current plan remains appropriate    Co-evaluation             End of Session Equipment Utilized During Treatment: Gait belt Activity Tolerance: Patient tolerated treatment well Patient left: in chair;with call bell/phone within reach     Time: 0807-0833 PT Time Calculation (min): 26 min  Charges:  $Gait Training: 23-37 mins                    G Codes:      Jacqualyn Posey 10/10/2013, 8:39 AM  10/10/2013 Jacqualyn Posey PTA [phone] pager [phone] office

## 2013-10-10 NOTE — Progress Notes (Signed)
Patient ID: [name] [name], male   DOB: 1950/05/24, 63 y.o.   MRN: [id number]     Subjective:  Patient reports pain as mild to moderate.  Patient states that he is ready to go home.  He denies any CP or SOB  Objective:   VITALS:   Filed Vitals:   10/09/13 2000 10/09/13 2134 10/10/13 0000 10/10/13 0618  BP:  130/69  132/72  Pulse:  72  72  Temp:  97.5 F (36.4 C)  97.6 F (36.4 C)  TempSrc:      Resp: 18 18 18 18   SpO2: 96% 96% 96% 96%    ABD soft Sensation intact distally Dorsiflexion/Plantar flexion intact Incision: dressing C/D/I and scant drainage   Lab Results  Component Value Date   WBC 8.5 09/27/2013   HGB 14.1 09/27/2013   HCT 41.1 09/27/2013   MCV 92.8 09/27/2013   PLT 227 09/27/2013     Assessment/Plan: 2 Days Post-Op   Active Problems:   DJD (degenerative joint disease)   [facility] diet Up with therapy Discharge home with home health WBAT Dry dressing PRN Follow up with Dr Edmonia Lynch in 2 weeks   [name] [name] 10/10/2013, 7:06 AM   Edmonia Lynch MD [phone]

## 2013-10-10 NOTE — Progress Notes (Signed)
[name] [name] to be D/C'd Home per MD order. Discussed with the patient and all questions fully answered.    Medication List         acetaminophen 500 MG tablet  Commonly known as:  TYLENOL  Take 1,000 mg by mouth 2 (two) times daily as needed for moderate pain.     aspirin EC 325 MG tablet  Take 1 tablet (325 mg total) by mouth daily.     carvedilol 25 MG tablet  Commonly known as:  COREG  Take 1 tablet (25 mg total) by mouth 2 (two) times daily with a meal.     colchicine 0.6 MG tablet  Take 0.6 mg by mouth 2 (two) times daily.     docusate sodium 100 MG capsule  Commonly known as:  COLACE  Take 1 capsule (100 mg total) by mouth 2 (two) times daily. Continue this while taking narcotics to help with bowel movements     furosemide 40 MG tablet  Commonly known as:  LASIX  Take 1.5 tablets (60 mg total) by mouth 2 (two) times daily.     HYDROcodone-acetaminophen 5-325 MG per tablet  Commonly known as:  NORCO  Take 1-2 tablets by mouth every 4 (four) hours as needed for moderate pain.     isosorbide-hydrALAZINE 20-37.5 MG per tablet  Commonly known as:  BIDIL  Take 2 tablets by mouth 3 (three) times daily.     lisinopril 40 MG tablet  Commonly known as:  PRINIVIL,ZESTRIL  Take 1 tablet (40 mg total) by mouth daily.     Magnesium 250 MG Tabs  Take 250 mg by mouth daily.     meloxicam 15 MG tablet  Commonly known as:  MOBIC  Take 1 tablet (15 mg total) by mouth daily.     ondansetron 4 MG tablet  Commonly known as:  ZOFRAN  Take 1 tablet (4 mg total) by mouth every 8 (eight) hours as needed for nausea.     spironolactone 25 MG tablet  Commonly known as:  ALDACTONE  Take 25 mg by mouth daily.        VVS, Skin clean, dry and intact without evidence of skin break down, no evidence of skin tears noted.  IV catheter discontinued intact. Site without signs and symptoms of complications. Dressing and pressure applied.  An After Visit Summary was printed and given to  the patient.  Patient escorted via [place], and D/C home via private auto.  [name]  10/10/2013 5:29 PM

## 2013-10-10 NOTE — Progress Notes (Signed)
Occupational Therapy Treatment Patient Details Name: Bay Wayson MRN: [id number] DOB: 02/03/1950 Today's Date: 10/10/2013    History of present illness 63 y.o. male admitted to [facility] on 10/08/13 for elective L direct anterior THA.  Pt has significant PMHx of HTN, systolic heart failure, SOB, sickle cell trait, stroke, arthritis (R knee), gout, CHF, and brain aneurysm.     OT comments  Progressing well with skilled therapies.  Completed all components of toileting with min guard a.  States he will sponge bathe initially due to set up of bathroom and c/o le weakness.  Wants to defer until he is stronger and feels safe bending knee to clear the height of the tub.  Eager for d/c home  Follow Up Recommendations  Other (comment);No OT follow up    Equipment Recommendations  3 in 1 bedside comode    Recommendations for Other Services      Precautions / Restrictions         Mobility Bed Mobility               General bed mobility comments: pt in recliner  Transfers Overall transfer level: Needs assistance Equipment used: Rolling walker (2 wheeled) Transfers: Sit to/from Stand Sit to Stand: Min guard         General transfer comment: supervision for safety. slightly unsteady at first but no LOB    Balance                                   ADL Overall ADL's : Needs assistance/impaired     Grooming: Wash/dry hands;Min guard;Standing                   Toilet Transfer: Min guard;Cueing for safety;Ambulation;Regular Toilet;Grab bars;RW   Toileting- Water quality scientist and Hygiene: Min guard;Sit to/from stand   Tub/ Shower Transfer: Tub transfer;Maximal assistance;Ambulation   Functional mobility during ADLs: Min guard;Rolling walker General ADL Comments: attempted tub transfer, pt. concerned about size of b.room does not feel walker will fit in bathroom.  reviewed steps and tech. in case.  pt. unable to bring knee up to clear tub.  states he  will sponge bathe initiaily and defer tub transfer to home health therapies.  toileting completed with min guard a      Vision                     Perception     Praxis      Cognition                             Extremity/Trunk Assessment               Exercises     Shoulder Instructions       General Comments      Pertinent Vitals/ Pain       Pain Assessment: No/denies pain  Home Living                                          Prior Functioning/Environment              Frequency Min 2X/week     Progress Toward Goals  OT Goals(current goals can now be found in the care plan section)  Progress towards  OT goals: Progressing toward goals     Plan Discharge plan remains appropriate    Co-evaluation                 End of Session Equipment Utilized During Treatment: Gait belt;Rolling walker   Activity Tolerance Patient tolerated treatment well   Patient Left in chair;with call bell/phone within reach;with nursing/sitter in room   Nurse Communication          Time: 6433-2951 OT Time Calculation (min): 23 min  Charges: OT General Charges $OT Visit: 1 Procedure OT Treatments $Self Care/Home Management : 23-37 mins  Janice Coffin , COTA/L  10/10/2013, 2:28 PM

## 2013-10-14 NOTE — Progress Notes (Signed)
I participated in the care of this patient and agree with the above history, physical and evaluation. I performed a review of the history and a physical exam as detailed   Arav Bannister Daniel Jasiel Belisle MD  

## 2013-10-22 ENCOUNTER — Telehealth: Payer: Self-pay | Admitting: Licensed Clinical Social Worker

## 2013-10-22 ENCOUNTER — Ambulatory Visit (INDEPENDENT_AMBULATORY_CARE_PROVIDER_SITE_OTHER): Payer: Medicaid Other | Admitting: Nurse Practitioner

## 2013-10-22 ENCOUNTER — Encounter: Payer: Self-pay | Admitting: Nurse Practitioner

## 2013-10-22 VITALS — BP 128/72 | HR 67 | Ht 76.0 in | Wt 261.0 lb

## 2013-10-22 DIAGNOSIS — I5022 Chronic systolic (congestive) heart failure: Secondary | ICD-10-CM

## 2013-10-22 LAB — CBC
HCT: 36.2 % — ABNORMAL LOW (ref 39.0–52.0)
Hemoglobin: 12.3 g/dL — ABNORMAL LOW (ref 13.0–17.0)
MCHC: 34.1 g/dL (ref 30.0–36.0)
MCV: 95 fl (ref 78.0–100.0)
Platelets: 313 10*3/uL (ref 150.0–400.0)
RBC: 3.81 Mil/uL — ABNORMAL LOW (ref 4.22–5.81)
RDW: 13.5 % (ref 11.5–15.5)
WBC: 15.7 10*3/uL — ABNORMAL HIGH (ref 4.0–10.5)

## 2013-10-22 LAB — BASIC METABOLIC PANEL
BUN: 22 mg/dL (ref 6–23)
CO2: 28 mEq/L (ref 19–32)
Calcium: 9.5 mg/dL (ref 8.4–10.5)
Chloride: 99 mEq/L (ref 96–112)
Creatinine, Ser: 1.3 mg/dL (ref 0.4–1.5)
GFR: 72.01 mL/min (ref >60.00)
Glucose, Bld: 103 mg/dL — ABNORMAL HIGH (ref 70–99)
Potassium: 3.9 mEq/L (ref 3.5–5.1)
Sodium: 136 mEq/L (ref 135–145)

## 2013-10-22 NOTE — Patient Instructions (Signed)
We will be checking the following labs today BMET and CBC.   Stay on your current medicines  We are going to send you to a general surgeon for a "fat pad biopsy"  See Dr. Acie Fredrickson in 3 months  Call the [facility] office at [phone] if you have any questions, problems or concerns.

## 2013-10-22 NOTE — Telephone Encounter (Signed)
CSW received [employer] eyeglass voucher on behalf of [name] [name].  Pt is currently out an appointment this morning.  CSW will notify [name] [name] and schedule time for pt to choose frames.

## 2013-10-22 NOTE — Progress Notes (Addendum)
[name] [name] Date of Birth: 11-16-1950 Medical Record #[id number]  History of Present Illness: [name] [name] is seen back today for a 1 month check. Seen for Dr. Acie Fredrickson. He has had longstanding HTN, asthma, and prior CVA. Originally from the [place].   Presented earlier this year with increased SOB, DOE and orthopnea for 3 weeks - he was found to be hypertensive, using NSAID, etc. Had been taking some Lasix that he was prescribed while living in [place]. He was cathed - had mild CAD with elevated right sided pressures. Bidil added along with ACE. Renal function improved. EF was 35 to 40%.   He has had several admissions and visits here since his original diagnosis for heart failure. Medicines have been difficult/mixed up in the past. Now on Medicaid. Has had some gout issues as well. Now has PCP. Had his hip surgery just 10 days ago.  Comes back today. Here alone. Doing ok. Breathing good. No swelling. Has had his hip replaced and tolerated fairly well. Little constipated. Now using a cane. Has his rehab at home. No cardiac complaints. Weight is up a few pounds but he is eating more and not as active. No swelling. Doing ok. No chest pain and says he feels good. He tells me that the anesthesiologist told him on the day of his surgery that his EF was 55%. This is not true - EF more like 35%.  Current Outpatient Prescriptions  Medication Sig Dispense Refill  . acetaminophen (TYLENOL) 500 MG tablet Take 1,000 mg by mouth 2 (two) times daily as needed for moderate pain.      [name] aspirin EC 325 MG tablet Take 1 tablet (325 mg total) by mouth daily.  30 tablet  0  . carvedilol (COREG) 25 MG tablet Take 1 tablet (25 mg total) by mouth 2 (two) times daily with a meal.  60 tablet  6  . colchicine 0.6 MG tablet Take 0.6 mg by mouth 2 (two) times daily.       [name] docusate sodium (COLACE) 100 MG capsule Take 1 capsule (100 mg total) by mouth 2 (two) times daily. Continue this while taking narcotics to help  with bowel movements  30 capsule  1  . furosemide (LASIX) 40 MG tablet Take 1.5 tablets (60 mg total) by mouth 2 (two) times daily.  90 tablet  1  . HYDROcodone-acetaminophen (NORCO) 5-325 MG per tablet Take 1-2 tablets by mouth every 4 (four) hours as needed for moderate pain.  90 tablet  0  . isosorbide-hydrALAZINE (BIDIL) 20-37.5 MG per tablet Take 2 tablets by mouth 3 (three) times daily.  550 tablet  3  . lisinopril (PRINIVIL,ZESTRIL) 40 MG tablet Take 1 tablet (40 mg total) by mouth daily.  30 tablet  10  . Magnesium 250 MG TABS Take 250 mg by mouth daily.      . meloxicam (MOBIC) 15 MG tablet Take 1 tablet (15 mg total) by mouth daily.  30 tablet  0  . spironolactone (ALDACTONE) 25 MG tablet Take 25 mg by mouth daily.      . ondansetron (ZOFRAN) 4 MG tablet Take 1 tablet (4 mg total) by mouth every 8 (eight) hours as needed for nausea.  60 tablet  0   No current facility-administered medications for this visit.    Allergies  Allergen Reactions  . Hydrocodone     Itch and crazy    Past Medical History  Diagnosis Date  . Hypertension   . Systolic heart  failure     EF is 35 to 40%  . Asthma     'as a child"   . Shortness of breath     "comes on at anytime" (01/18/2013)  . Sickle cell trait   . Stroke 12/1988  . CHF (congestive heart failure)   . Blood clotting disorder     pt. states his family has a history of "slow clotting", had bleeding after oral surgery age 23 but none with extractions 2015 or adult surgeries; thought father (now deceased) had a hx of hemophilia  . History of gout   . Daily headache     are due to the medication he is taking  . Brain aneurysm 01/11/1989  . Arthritis     "left hip; right knee" (10/09/2013)    Past Surgical History  Procedure Laterality Date  . 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 Cardiac catheterization  12/1988; 12/2012  . Multiple tooth extractions  spring 2015    "took out 7"  . Total hip arthroplasty Left  10/08/2013    anterior approach  . Hernia repair  2004?    "above the navel"   . Gum surgery  1966  . Total hip arthroplasty Left 10/08/2013    Procedure: TOTAL HIP ARTHROPLASTY ANTERIOR APPROACH;  Surgeon: Renette Butters, MD;  Location: [facility];  Service: Orthopedics;  Laterality: Left;    History  Smoking status  . Never Smoker   Smokeless tobacco  . Never Used    History  Alcohol Use No    Family History  Problem Relation Age of Onset  . Heart attack Mother   . Hypertension Mother     Review of Systems: The review of systems is per the HPI.  All other systems were reviewed and are negative.  Physical Exam: BP 128/72  Pulse 67  Ht 6\' 4"  (1.93 m)  Wt 261 lb (118.389 kg)  BMI 31.78 kg/m2  SpO2 95% Patient is very pleasant and in no acute distress. Weight up from 255 in July. Skin is warm and dry. Color is normal.  HEENT is unremarkable. Normocephalic/atraumatic. PERRL. Sclera are nonicteric. Neck is supple. No masses. No JVD. Lungs are clear. Cardiac exam shows a regular rate and rhythm. Abdomen is soft. Extremities are without edema. Gait and ROM are intact. Using a cane. No gross neurologic deficits noted.  Wt Readings from Last 3 Encounters:  10/22/13 261 lb (118.389 kg)  09/27/13 258 lb 5 oz (117.17 kg)  09/11/13 257 lb 8 oz (116.801 kg)    LABORATORY DATA/PROCEDURES:  Lab Results  Component Value Date   WBC 8.5 09/27/2013   HGB 14.1 09/27/2013   HCT 41.1 09/27/2013   PLT 227 09/27/2013   GLUCOSE 87 09/27/2013   CHOL 184 05/22/2013   TRIG 87 05/22/2013   HDL 42 05/22/2013   LDLCALC 125* 05/22/2013   NA 142 09/27/2013   K 4.3 09/27/2013   CL 103 09/27/2013   CREATININE 1.35 09/27/2013   BUN 22 09/27/2013   CO2 29 09/27/2013   TSH 1.124 01/18/2013   PSA 2.49 05/22/2013   INR 1.09 09/27/2013   HGBA1C 5.3 05/22/2013    BNP (last 3 results)  Recent Labs  02/01/13 1232 02/15/13 1103 02/28/13 1315  PROBNP 723.0* 603.0* 6365.0*   Echo Study Conclusions from December  2014  - Left ventricle: The cavity size was moderately dilated. Wall thickness was increased in a pattern of moderate LVH. Systolic function  was moderately reduced. The estimated ejection fraction was in the range of 35% to 40%. Diffuse hypokinesis. Regional wall motion abnormalities cannot be excluded. The study is not technically sufficient to allow evaluation of LV diastolic function. - Aortic valve: Mild regurgitation. - Mitral valve: Mild regurgitation. - Left atrium: The atrium was mildly dilated. - Right ventricle: The cavity size was mildly dilated. - Right atrium: The atrium was mildly dilated. - Tricuspid valve: Moderate regurgitation. - Pulmonic valve: Moderate regurgitation. - Pulmonary arteries: PA peak pressure: 37mm Hg (S).   Echo Study Conclusions from June 2015  - Left ventricle: The cavity size was mildly dilated. Wall thickness was increased in a pattern of severe LVH. Systolic function was severely reduced. The estimated ejection fraction was in the range of 25% to 30%. Diffuse hypokinesis. Features are consistent with a pseudonormal left ventricular filling pattern, with concomitant abnormal relaxation and increased filling pressure (grade 2 diastolic dysfunction). - Aortic valve: There was trivial regurgitation. - Aortic root: The aortic root was mildly dilated. - Ascending aorta: The ascending aorta was mildly dilated. - Mitral valve: There was mild regurgitation. - Left atrium: The atrium was mildly dilated. - Right ventricle: The cavity size was mildly dilated. - Right atrium: The atrium was mildly dilated. - Pulmonary arteries: Systolic pressure was mildly to moderately increased. PA peak pressure: 44 mm Hg (S).  Impressions:  - Severe global reduction in LV function; myocardium with speckled appearance; consider amyloid; grade 2 diastolic dysfunction, mild LAE/RAE/RVE.    Coronary angiography:  Coronary dominance: right  Left mainstem: Long  and normal.  Left anterior descending (LAD): Normal. D1 large and normal.  Left circumflex (LCx): AV groove normal. OM large and normal.  Right coronary artery (RCA): Very large. Mild diffuse luminal irregularities. Long mid 40% stenosis. AM very large with PDA. Mild diffuse luminal irregularities. PL large nd normal  Left ventriculography: Left ventricle was crossed for pressures but I did not do an LV gram secondary to elevated end diastolic pressure and CKD  Final Conclusions: Mild coronary plaque. Elevated right sided pressures.  Recommendations: Elevated right sided pressures appear to be related in part to systolic and diastolic heart failure with elevated LVEDP. However, the elevated right sided pressures were somewhat out of proportion to the EDP elevation. If he does not improve with usual treatment and lifestyle changes for acute on chronic systolic and diastolic HF I would consider pulmonary vasodilator therapy.  Jeneen Rinks Hochrein  01/21/2013, 11:08 AM     Assessment / Plan:  1. Systolic HF - EF of 94% - probably from long standing HTN -  I have talked with Dr. Acie Fredrickson regarding this last echo - he feels like his EF is 35%. There is concern for amyloid. Will refer to general surgery for a fat pad biopsy. Recheck baseline labs today.   2. HTN - BP looks ok.   3. CKD - normal on last check - rechecking today.   4. Gout - not a current issue.  5. Hip pain - s/p surgery. Doing fairly well.   See Dr. Acie Fredrickson in 3 months. No change with his current treatment plan.  Patient is agreeable to this plan and will call if any problems develop in the interim.   Burtis Junes, RN, [facility] [address] [place],   [id number] [phone]  Addendum:  His labs yesterday showed an elevated WBC   Lab Results  Component Value Date   WBC  15.7* 10/22/2013   HGB 12.3* 10/22/2013   HCT 36.2* 10/22/2013   MCV 95.0 10/22/2013   PLT 313.0  10/22/2013    He had no signs of infection. I have asked to have this repeated. He went to see Dr. Percell Miller today on 10/23/2013 And had his bandage removed - no signs of infection. Dr. Percell Miller said he was not surprised by this result (in light of recent hip surgery).  I would like to get this repeated to just make sure it is coming down and that this was not lab error.

## 2013-10-23 ENCOUNTER — Telehealth: Payer: Self-pay | Admitting: *Deleted

## 2013-10-23 ENCOUNTER — Other Ambulatory Visit: Payer: Self-pay | Admitting: *Deleted

## 2013-10-23 ENCOUNTER — Encounter: Payer: Self-pay | Admitting: Licensed Clinical Social Worker

## 2013-10-23 ENCOUNTER — Other Ambulatory Visit (INDEPENDENT_AMBULATORY_CARE_PROVIDER_SITE_OTHER): Payer: Medicaid Other

## 2013-10-23 DIAGNOSIS — D72829 Elevated white blood cell count, unspecified: Secondary | ICD-10-CM

## 2013-10-23 LAB — CBC WITH DIFFERENTIAL/PLATELET
Basophils Absolute: 0 10*3/uL (ref 0.0–0.1)
Basophils Relative: 0.1 % (ref 0.0–3.0)
Eosinophils Absolute: 0.2 10*3/uL (ref 0.0–0.7)
Eosinophils Relative: 1 % (ref 0.0–5.0)
HCT: 38.2 % — ABNORMAL LOW (ref 39.0–52.0)
Hemoglobin: 12.4 g/dL — ABNORMAL LOW (ref 13.0–17.0)
Lymphocytes Relative: 9.8 % — ABNORMAL LOW (ref 12.0–46.0)
Lymphs Abs: 1.5 10*3/uL (ref 0.7–4.0)
MCHC: 32.3 g/dL (ref 30.0–36.0)
MCV: 96.8 fl (ref 78.0–100.0)
Monocytes Absolute: 1.1 10*3/uL — ABNORMAL HIGH (ref 0.1–1.0)
Monocytes Relative: 7.2 % (ref 3.0–12.0)
Neutro Abs: 12.5 10*3/uL — ABNORMAL HIGH (ref 1.4–7.7)
Neutrophils Relative %: 81.9 % — ABNORMAL HIGH (ref 43.0–77.0)
Platelets: 322 10*3/uL (ref 150.0–400.0)
RBC: 3.95 Mil/uL — ABNORMAL LOW (ref 4.22–5.81)
RDW: 13.3 % (ref 11.5–15.5)
WBC: 15.3 10*3/uL — ABNORMAL HIGH (ref 4.0–10.5)

## 2013-10-23 NOTE — Telephone Encounter (Signed)
S/w pt did not want to come in for repeat lab due to what Dr. Percell Miller told pt today.  Stated [name] [name] would like him to come in today pt agreeable to plan.  Will come in for repeat CBC

## 2013-10-24 ENCOUNTER — Telehealth: Payer: Self-pay | Admitting: *Deleted

## 2013-10-24 ENCOUNTER — Encounter: Payer: Self-pay | Admitting: Licensed Clinical Social Worker

## 2013-10-24 DIAGNOSIS — D72829 Elevated white blood cell count, unspecified: Secondary | ICD-10-CM

## 2013-10-24 NOTE — Progress Notes (Signed)
[name] [name] presents as a walk in today to meet with CSW to utilize [employer] for eyeglasses.  [name] [name] chose frames and CSW placed order.  CSW mailed order details to [name] [name].

## 2013-10-24 NOTE — Telephone Encounter (Signed)
Message copied by Earvin Hansen on Thu Oct 24, 2013  6:19 PM ------      Message from: Burtis Junes      Created: Wed Oct 23, 2013  4:20 PM       Ok to report. Please let him know that Dr. Percell Miller and I had talked - Dr. Percell Miller felt like this was a result of the surgery.       I would like for him to still monitor for any signs of infection - cough, fever, chills, trouble urination,etc. Would like to repeat this next week.            Will ask Dr. Mare Ferrari to review and advise as well. This patient is also being worked up for amyloid. Also noted to have had elevated WBC count last December. ------

## 2013-10-24 NOTE — Telephone Encounter (Signed)
Encounter with patient on 10/24/13

## 2013-10-24 NOTE — Telephone Encounter (Signed)
Advised patient of lab results and scheduled follow up labs. Patient denies any s/s of infection.

## 2013-10-31 ENCOUNTER — Encounter: Payer: Self-pay | Admitting: Licensed Clinical Social Worker

## 2013-10-31 ENCOUNTER — Other Ambulatory Visit (INDEPENDENT_AMBULATORY_CARE_PROVIDER_SITE_OTHER): Payer: Medicaid Other | Admitting: *Deleted

## 2013-10-31 DIAGNOSIS — D72829 Elevated white blood cell count, unspecified: Secondary | ICD-10-CM

## 2013-10-31 LAB — CBC WITH DIFFERENTIAL/PLATELET
Basophils Absolute: 0 10*3/uL (ref 0.0–0.1)
Basophils Relative: 0.4 % (ref 0.0–3.0)
Eosinophils Absolute: 0.2 10*3/uL (ref 0.0–0.7)
Eosinophils Relative: 1.9 % (ref 0.0–5.0)
HCT: 40.6 % (ref 39.0–52.0)
Hemoglobin: 13.1 g/dL (ref 13.0–17.0)
Lymphocytes Relative: 26.3 % (ref 12.0–46.0)
Lymphs Abs: 2.7 10*3/uL (ref 0.7–4.0)
MCHC: 32.3 g/dL (ref 30.0–36.0)
MCV: 96.6 fl (ref 78.0–100.0)
Monocytes Absolute: 0.9 10*3/uL (ref 0.1–1.0)
Monocytes Relative: 9.2 % (ref 3.0–12.0)
Neutro Abs: 6.3 10*3/uL (ref 1.4–7.7)
Neutrophils Relative %: 62.2 % (ref 43.0–77.0)
Platelets: 293 10*3/uL (ref 150.0–400.0)
RBC: 4.2 Mil/uL — ABNORMAL LOW (ref 4.22–5.81)
RDW: 13.7 % (ref 11.5–15.5)
WBC: 10.2 10*3/uL (ref 4.0–10.5)

## 2013-10-31 NOTE — Progress Notes (Signed)
Patient ID: [name] [name], male   DOB: 1950-07-13, 63 y.o.   MRN: [id number] Per pt request, CSW emailed listing of mental health resources originally sent 08/2013.  Pt states he does not remember MyChart login

## 2013-11-01 DIAGNOSIS — H11009 Unspecified pterygium of unspecified eye: Secondary | ICD-10-CM | POA: Insufficient documentation

## 2013-11-01 DIAGNOSIS — H2510 Age-related nuclear cataract, unspecified eye: Secondary | ICD-10-CM | POA: Insufficient documentation

## 2013-11-06 ENCOUNTER — Ambulatory Visit: Payer: Medicaid Other | Attending: Orthopedic Surgery | Admitting: Physical Therapy

## 2013-11-06 ENCOUNTER — Other Ambulatory Visit: Payer: Self-pay | Admitting: Cardiovascular Disease

## 2013-11-06 DIAGNOSIS — I509 Heart failure, unspecified: Secondary | ICD-10-CM | POA: Diagnosis not present

## 2013-11-06 DIAGNOSIS — N189 Chronic kidney disease, unspecified: Secondary | ICD-10-CM | POA: Diagnosis not present

## 2013-11-06 DIAGNOSIS — Z5189 Encounter for other specified aftercare: Secondary | ICD-10-CM | POA: Insufficient documentation

## 2013-11-06 DIAGNOSIS — I10 Essential (primary) hypertension: Secondary | ICD-10-CM | POA: Insufficient documentation

## 2013-11-06 DIAGNOSIS — Z96649 Presence of unspecified artificial hip joint: Secondary | ICD-10-CM | POA: Diagnosis not present

## 2013-11-13 ENCOUNTER — Ambulatory Visit (INDEPENDENT_AMBULATORY_CARE_PROVIDER_SITE_OTHER): Payer: Medicaid Other | Admitting: Internal Medicine

## 2013-11-13 ENCOUNTER — Encounter: Payer: Self-pay | Admitting: Internal Medicine

## 2013-11-13 ENCOUNTER — Other Ambulatory Visit (HOSPITAL_COMMUNITY): Payer: Self-pay | Admitting: Internal Medicine

## 2013-11-13 VITALS — BP 107/56 | HR 71 | Temp 97.8°F | Ht 76.0 in | Wt 260.9 lb

## 2013-11-13 DIAGNOSIS — Z9189 Other specified personal risk factors, not elsewhere classified: Secondary | ICD-10-CM

## 2013-11-13 DIAGNOSIS — I10 Essential (primary) hypertension: Secondary | ICD-10-CM

## 2013-11-13 DIAGNOSIS — H9192 Unspecified hearing loss, left ear: Secondary | ICD-10-CM

## 2013-11-13 DIAGNOSIS — M1A9XX0 Chronic gout, unspecified, without tophus (tophi): Secondary | ICD-10-CM

## 2013-11-13 DIAGNOSIS — M169 Osteoarthritis of hip, unspecified: Secondary | ICD-10-CM

## 2013-11-13 DIAGNOSIS — I5022 Chronic systolic (congestive) heart failure: Secondary | ICD-10-CM

## 2013-11-13 DIAGNOSIS — R454 Irritability and anger: Secondary | ICD-10-CM

## 2013-11-13 MED ORDER — ALLOPURINOL 100 MG PO TABS: 100.0000 mg | ORAL_TABLET | Freq: Every day | ORAL | Status: DC

## 2013-11-13 NOTE — Assessment & Plan Note (Addendum)
He has not had a gout attack in several months.  He continues with colchicine.  - continue colchicine 0.6mg  BID; plan to decrease to 0.6mg  daily at next visit; he will need to remain on colchicine until uric acid is < 6 for six months - start allopurinol 100mg  daily - check uric acid today and in four weeks; will adjust allopurinol until uric acid < 6 and then continue indefinitely at that maintenance dose

## 2013-11-13 NOTE — Progress Notes (Signed)
   Subjective:    Patient ID: [name] [name], male    DOB: 09/12/1950, 63 y.o.   MRN: [id number]  HPI Comments: [name] [name] is a 63 year old male with a PMH of chronic systolic HF (EF 60-63% June 2015; likely improved to 35% per cardiology interpretation), HTN, prior CVA, OA and CKD2.  Please see problem based assessment and plan for update of patient's chronic medical conditions.      Review of Systems  Constitutional: Negative for fever and appetite change.  Respiratory: Negative for shortness of breath.   Cardiovascular: Negative for chest pain and leg swelling.  Gastrointestinal: Negative for nausea, vomiting and diarrhea.  Genitourinary: Negative for dysuria.  Neurological: Positive for light-headedness. Negative for syncope.       After taking BP meds.       Objective:   Physical Exam  Vitals reviewed. Constitutional: He is oriented to person, place, and time. He appears well-developed. No distress.  HENT:  Head: Normocephalic and atraumatic.  Right Ear: External ear normal.  Left Ear: External ear normal.  Mouth/Throat: Oropharynx is clear and moist. No oropharyngeal exudate.  TMs intact B/L, minimal earwax, no signs of ear infection; B/L pinna and mastoid are non-tender.  Eyes: EOM are normal. Pupils are equal, round, and reactive to light.  Cardiovascular: Normal rate and regular rhythm.  Exam reveals no gallop and no friction rub.   No murmur heard. Pulmonary/Chest: Effort normal and breath sounds normal. No respiratory distress. He has no wheezes. He has no rales.  Abdominal: Soft. Bowel sounds are normal. He exhibits no distension. There is no tenderness.  Musculoskeletal: Normal range of motion. He exhibits no edema and no tenderness.  He is able to stand and walk unassisted.  Neurological: He is alert and oriented to person, place, and time. No cranial nerve deficit.  Skin: Skin is warm. He is not diaphoretic.  Psychiatric: He has a normal mood and affect. His  behavior is normal.          Assessment & Plan:  Please see problem based assessment and plan.

## 2013-11-13 NOTE — Assessment & Plan Note (Signed)
He has gone for counseling and will continue weekly therapy.  He finds this helpful and feels his anger is under control.

## 2013-11-13 NOTE — Assessment & Plan Note (Signed)
He underwent hip surgery last month and is recovering nicely.  He has already seen ortho and is due for another follow-up soon.  He is going to PT. - continue PT and ortho follow-up

## 2013-11-13 NOTE — Assessment & Plan Note (Addendum)
BP Readings from Last 3 Encounters:  11/13/13 107/56  10/22/13 128/72  10/10/13 141/84    Lab Results  Component Value Date   NA 136 10/22/2013   K 3.9 10/22/2013   CREATININE 1.3 10/22/2013    Assessment: Blood pressure control:  well controlled Progress toward BP goal:   at goal Comments: He continues to report lightheadedness after taking BP medications.  He does not want me to adjust BP medications and would rather wait and discuss with Cardiology.   Plan: Medications:  continue current medications:  Aldactone 25mg  daily, lisinopril 40mg  daily, Bidil 20-37.5mg  TID, Lasix 60mg  BID, Coreg 25mg  BID  Educational resources provided: brochure Self management tools provided: home blood pressure logbook Other plans: He agrees to follow-up with Cardiology regarding his symptoms and medication adjustment.  I have advised him to take his time when going from seated to standing.  He ambulates with the assistance of a walker or can since his hip replacement a few weeks ago.  He was advised to seek emergency help for severe symptoms or syncope.

## 2013-11-13 NOTE — Patient Instructions (Signed)
1. We will call you with an appointments for the sleep study and hearing test.   2. Please take all medications as prescribed.  I am prescribing allopurinol for gout prevention.  Please continue to take colchicine as well for now.  Please follow-up with the cardiologist for medication adjustments since you are having lightheadedness and do not want me to adjust your meds.   3. If you have worsening of your symptoms or new symptoms arise, please call the clinic ([phone]), or go to the ER immediately if symptoms are severe.  Return to see me in 1 month so we can check labs and adjust allopurinol.  Allopurinol tablets What is this medicine? ALLOPURINOL (al oh PURE i nole) reduces the amount of uric acid the body makes. It is used to treat the symptoms of gout. It is also used to treat or prevent high uric acid levels that occur as a result of certain types of chemotherapy. This medicine may also help patients who frequently have kidney stones. This medicine may be used for other purposes; ask your health care provider or pharmacist if you have questions. COMMON BRAND NAME(S): Zyloprim What should I tell my health care provider before I take this medicine? They need to know if you have any of these conditions: -kidney or liver disease -an unusual or allergic reaction to allopurinol, other medicines, foods, dyes, or preservatives -pregnant or trying to get pregnant -breast feeding How should I use this medicine? Take this medicine by mouth with a glass of water. Follow the directions on the prescription label. If this medicine upsets your stomach, take it with food or milk. Take your doses at regular intervals. Do not take your medicine more often than directed. Talk to your pediatrician regarding the use of this medicine in children. Special care may be needed. While this drug may be prescribed for children as young as 6 years for selected conditions, precautions do apply. Overdosage: If you  think you have taken too much of this medicine contact a poison control center or emergency room at once. NOTE: This medicine is only for you. Do not share this medicine with others. What if I miss a dose? If you miss a dose, take it as soon as you can. If it is almost time for your next dose, take only that dose. Do not take double or extra doses. What may interact with this medicine? Do not take this medicine with the following medication: -didanosine, ddI This medicine may also interact with the following medications: -amoxicillin or ampicillin -azathioprine -certain medicines used to treat gout -certain types of diuretics -chlorpropamide -cyclosporine -dicumarol -mercaptopurine -tolbutamide -warfarin This list may not describe all possible interactions. Give your health care provider a list of all the medicines, herbs, non-prescription drugs, or dietary supplements you use. Also tell them if you smoke, drink alcohol, or use illegal drugs. Some items may interact with your medicine. What should I watch for while using this medicine? Visit your doctor or health care professional for regular checks on your progress. If you are taking this medicine to treat gout, you may not have less frequent attacks at first. Keep taking your medicine regularly and the attacks should get better within 2 to 6 weeks. Drink plenty of water (10 to 12 full glasses a day) while you are taking this medicine. This will help to reduce stomach upset and reduce the risk of getting gout or kidney stones. Call your doctor or health care professional at once if you  get a skin rash together with chills, fever, sore throat, or nausea and vomiting, if you have blood in your urine, or difficulty passing urine. Do not take vitamin C without asking your doctor or health care professional. Too much vitamin C can increase the chance of getting kidney stones. You may get drowsy or dizzy. Do not drive, use machinery, or do anything  that needs mental alertness until you know how this drug affects you. Do not stand or sit up quickly, especially if you are an older patient. This reduces the risk of dizzy or fainting spells. Alcohol can make you more drowsy and dizzy. Alcohol can also increase the chance of stomach problems and increase the amount of uric acid in your blood. Avoid alcoholic drinks. What side effects may I notice from receiving this medicine? Side effects that you should report to your doctor or health care professional as soon as possible: -allergic reactions like skin rash, itching or hives, swelling of the face, lips, or tongue -breathing problems -muscle aches or pains -redness, blistering, peeling or loosening of the skin, including inside the mouth Side effects that usually do not require medical attention (report to your doctor or health care professional if they continue or are bothersome): -changes in taste -diarrhea -indigestion -stomach pain or cramps This list may not describe all possible side effects. Call your doctor for medical advice about side effects. You may report side effects to FDA at 1-800-FDA-1088. Where should I keep my medicine? Keep out of the reach of children. Store at room temperature between 15 and 25 degrees C (59 and 77 degrees F). Protect from light and moisture. Throw away any unused medicine after the expiration date. NOTE: This sheet is a summary. It may not cover all possible information. If you have questions about this medicine, talk to your doctor, pharmacist, or health care provider.  2015, Elsevier/Gold Standard. (2007-07-16 14:26:54)  Gout Gout is an inflammatory arthritis caused by a buildup of uric acid crystals in the joints. Uric acid is a chemical that is normally present in the blood. When the level of uric acid in the blood is too high it can form crystals that deposit in your joints and tissues. This causes joint redness, soreness, and swelling  (inflammation). Repeat attacks are common. Over time, uric acid crystals can form into masses (tophi) near a joint, destroying bone and causing disfigurement. Gout is treatable and often preventable. CAUSES  The disease begins with elevated levels of uric acid in the blood. Uric acid is produced by your body when it breaks down a naturally found substance called purines. Certain foods you eat, such as meats and fish, contain high amounts of purines. Causes of an elevated uric acid level include:  Being passed down from parent to child (heredity).  Diseases that cause increased uric acid production (such as obesity, psoriasis, and certain cancers).  Excessive alcohol use.  Diet, especially diets rich in meat and seafood.  Medicines, including certain cancer-fighting medicines (chemotherapy), water pills (diuretics), and aspirin.  Chronic kidney disease. The kidneys are no longer able to remove uric acid well.  Problems with metabolism. Conditions strongly associated with gout include:  Obesity.  High blood pressure.  High cholesterol.  Diabetes. Not everyone with elevated uric acid levels gets gout. It is not understood why some people get gout and others do not. Surgery, joint injury, and eating too much of certain foods are some of the factors that can lead to gout attacks. SYMPTOMS  An attack of gout comes on quickly. It causes intense pain with redness, swelling, and warmth in a joint.  Fever can occur.  Often, only one joint is involved. Certain joints are more commonly involved:  Base of the big toe.  Knee.  Ankle.  Wrist.  Finger. Without treatment, an attack usually goes away in a few days to weeks. Between attacks, you usually will not have symptoms, which is different from many other forms of arthritis. DIAGNOSIS  Your caregiver will suspect gout based on your symptoms and exam. In some cases, tests may be recommended. The tests may include:  Blood  tests.  Urine tests.  X-rays.  Joint fluid exam. This exam requires a needle to remove fluid from the joint (arthrocentesis). Using a microscope, gout is confirmed when uric acid crystals are seen in the joint fluid. TREATMENT  There are two phases to gout treatment: treating the sudden onset (acute) attack and preventing attacks (prophylaxis).  Treatment of an Acute Attack.  Medicines are used. These include anti-inflammatory medicines or steroid medicines.  An injection of steroid medicine into the affected joint is sometimes necessary.  The painful joint is rested. Movement can worsen the arthritis.  You may use warm or cold treatments on painful joints, depending which works best for you.  Treatment to Prevent Attacks.  If you suffer from frequent gout attacks, your caregiver may advise preventive medicine. These medicines are started after the acute attack subsides. These medicines either help your kidneys eliminate uric acid from your body or decrease your uric acid production. You may need to stay on these medicines for a very long time.  The early phase of treatment with preventive medicine can be associated with an increase in acute gout attacks. For this reason, during the first few months of treatment, your caregiver may also advise you to take medicines usually used for acute gout treatment. Be sure you understand your caregiver's directions. Your caregiver may make several adjustments to your medicine dose before these medicines are effective.  Discuss dietary treatment with your caregiver or dietitian. Alcohol and drinks high in sugar and fructose and foods such as meat, poultry, and seafood can increase uric acid levels. Your caregiver or dietitian can advise you on drinks and foods that should be limited. HOME CARE INSTRUCTIONS   Do not take aspirin to relieve pain. This raises uric acid levels.  Only take over-the-counter or prescription medicines for pain, discomfort,  or fever as directed by your caregiver.  Rest the joint as much as possible. When in bed, keep sheets and blankets off painful areas.  Keep the affected joint raised (elevated).  Apply warm or cold treatments to painful joints. Use of warm or cold treatments depends on which works best for you.  Use crutches if the painful joint is in your leg.  Drink enough fluids to keep your urine clear or pale yellow. This helps your body get rid of uric acid. Limit alcohol, sugary drinks, and fructose drinks.  Follow your dietary instructions. Pay careful attention to the amount of protein you eat. Your daily diet should emphasize fruits, vegetables, whole grains, and fat-free or low-fat milk products. Discuss the use of coffee, vitamin C, and cherries with your caregiver or dietitian. These may be helpful in lowering uric acid levels.  Maintain a healthy body weight. SEEK MEDICAL CARE IF:   You develop diarrhea, vomiting, or any side effects from medicines.  You do not feel better in 24 hours, or you are  getting worse. SEEK IMMEDIATE MEDICAL CARE IF:   Your joint becomes suddenly more tender, and you have chills or a fever. MAKE SURE YOU:   Understand these instructions.  Will watch your condition.  Will get help right away if you are not doing well or get worse. Document Released: 01/08/2000 Document Revised: 05/27/2013 Document Reviewed: 08/24/2011 [facility] Patient Information 2015 [place], [place]. This information is not intended to replace advice given to you by your health care provider. Make sure you discuss any questions you have with your health care provider.

## 2013-11-14 ENCOUNTER — Ambulatory Visit (INDEPENDENT_AMBULATORY_CARE_PROVIDER_SITE_OTHER): Payer: Self-pay | Admitting: Surgery

## 2013-11-14 DIAGNOSIS — H903 Sensorineural hearing loss, bilateral: Secondary | ICD-10-CM | POA: Insufficient documentation

## 2013-11-14 LAB — URIC ACID: Uric Acid, Serum: 6.6 mg/dL (ref 4.0–7.8)

## 2013-11-14 NOTE — Assessment & Plan Note (Signed)
He is followed by Dr. Acie Fredrickson and [name], NP of [place].  Cardiology feels HF is likely due to HTN but there is concern for amyloid.  They have referred him for fat pad biopsy for further evaluation.  Blood pressure continues to be well controlled, however he is experiencing lightheadedness on occasion.  He would prefer to have Cardiology make medication recommendations/adjustments.

## 2013-11-14 NOTE — Assessment & Plan Note (Signed)
[name] [name] reports chronic hearing loss but [name] [name] feels this is new.  He thinks the left ear is affected.  He denies trauma, discharge, ear pain.  Exam is unremarkable except he has subjective decrease in left sided hearing.  I will refer him to audiology.

## 2013-11-14 NOTE — H&P (Signed)
[name] [name] 11/12/2013 4:04 PM Location: [facility] Patient #: [id number] DOB: 1950-09-02 Married / Language: English / Race: Black or African American Male  History of Present Illness Marcello Moores A. Maylon Sailors MD; 11/12/2013 5:15 PM) Patient words: eval fat abd pt sent at the request of Remer Macho NP for fat pad bx to eveluate for amyloid. Pt had changees on ECHO concerning for this. pt does have CHF. He feels well today.  The patient is a 63 year old male    Other Problems Marjean Donna, [place]; 11/12/2013 4:05 PM) Arthritis Asthma Cerebrovascular Accident Congestive Heart Failure Hemorrhoids High blood pressure Umbilical Hernia Repair  Past Surgical History Marjean Donna, CMA; 11/12/2013 4:05 PM) Hip Surgery Left. Vasectomy  Diagnostic Studies History Marjean Donna, [place]; 11/12/2013 4:05 PM) Colonoscopy >10 years ago  Allergies Marjean Donna, CMA; 11/12/2013 4:05 PM) HYDROcodone Bitartrate *CHEMICALS*  Medication History Davy Pique Bynum, CMA; 11/12/2013 4:05 PM) Carvedilol (25MG  Tablet, Oral daily) Active. Colchicine (0.6MG  Tablet, Oral daily) Active. Furosemide (40MG  Tablet, Oral daily) Active. Lisinopril (40MG  Tablet, Oral daily) Active. Spironolactone (25MG  Tablet, Oral daily) Active. Aspirin EC (325MG  Tablet DR, Oral daily) Active. Oxycodone-Acetaminophen (10-325MG  Tablet, Oral as needed) Active.  Social History Marjean Donna, [place]; 11/12/2013 4:05 PM) Alcohol use Moderate alcohol use. Caffeine use Carbonated beverages, Coffee, Tea. Illicit drug use Prefer to discuss with provider. Tobacco use Former smoker.  Family History Marjean Donna, [place]; 11/12/2013 4:05 PM) Cerebrovascular Accident Mother. Heart Disease Mother. Hypertension Brother, Father, Mother. Melanoma Father. Prostate Cancer Brother, Father.  Review of Systems Davy Pique Bynum CMA; 11/12/2013 4:05 PM) General Not Present- Appetite Loss, Chills, Fatigue, Fever, Night  Sweats, Weight Gain and Weight Loss. Skin Present- Dryness. Not Present- Change in Wart/Mole, Hives, Jaundice, New Lesions, Non-Healing Wounds, Rash and Ulcer. HEENT Present- Hearing Loss. Not Present- Earache, Hoarseness, Nose Bleed, Oral Ulcers, Ringing in the Ears, Seasonal Allergies, Sinus Pain, Sore Throat, Visual Disturbances, Wears glasses/contact lenses and Yellow Eyes. Respiratory Present- Snoring. Not Present- Bloody sputum, Chronic Cough, Difficulty Breathing and Wheezing. Cardiovascular Not Present- Chest Pain, Difficulty Breathing Lying Down, Leg Cramps, Palpitations, Rapid Heart Rate, Shortness of Breath and Swelling of Extremities. Gastrointestinal Present- Constipation. Not Present- Abdominal Pain, Bloating, Bloody Stool, Change in Bowel Habits, Chronic diarrhea, Difficulty Swallowing, Excessive gas, Gets full quickly at meals, Hemorrhoids, Indigestion, Nausea, Rectal Pain and Vomiting. Male Genitourinary Present- Nocturia and Urgency. Not Present- Blood in Urine, Change in Urinary Stream, Frequency, Impotence, Painful Urination and Urine Leakage.   Vitals (Sonya Bynum CMA; 11/12/2013 4:07 PM) 11/12/2013 4:05 PM Weight: 258 lb Height: 76in Body Surface Area: 2.51 m Body Mass Index: 31.4 kg/m Temp.: 97.91F(Temporal)  Pulse: 81 (Regular)  BP: 160/98 (Sitting, Left Arm, Standard)    Physical Exam (Jamis Kryder A. Varnika Butz MD; 11/12/2013 5:16 PM) General Mental Status-Alert. General Appearance-Consistent with stated age. Hydration-Well hydrated. Voice-Normal.  Eye Eyeball - Bilateral-Extraocular movements intact. Sclera/Conjunctiva - Bilateral-No scleral icterus.  Chest and Lung Exam Chest and lung exam reveals -quiet, even and easy respiratory effort with no use of accessory muscles and on auscultation, normal breath sounds, no adventitious sounds and normal vocal resonance. Inspection Chest Wall - Normal. Back -  normal.  Cardiovascular Cardiovascular examination reveals -normal heart sounds, regular rate and rhythm with no murmurs and normal pedal pulses bilaterally.  Abdomen Note: obese soft non distended   Neurologic Neurologic evaluation reveals -alert and oriented x 3 with no impairment of recent or remote memory. Mental Status-Normal.  Musculoskeletal Normal Exam - Left-Upper Extremity Strength Normal and Lower Extremity  Strength Normal. Normal Exam - Right-Upper Extremity Strength Normal and Lower Extremity Strength Normal.    Assessment & Plan (Israa Caban A. Alie Moudy MD; 11/12/2013 4:48 PM) CHF (CONGESTIVE HEART FAILURE) (428.0  I50.9) FAT PAD (278.1  E65) Impression: fat pad bx requested to rule out amyloidosis. pt has questions and wants to talk to cardiology about this first. he will contact me once they are sure they want to proceed. Current Plans  Written instructions provided discussed biopsy with patient of abdominal fat to aid in dx of amyloid. He would like to talk with cardiologist first before proceeding.   Signed by Turner Daniels, MD (11/12/2013 5:16 PM)

## 2013-11-18 ENCOUNTER — Ambulatory Visit: Payer: Medicaid Other | Admitting: Physical Therapy

## 2013-11-18 DIAGNOSIS — Z5189 Encounter for other specified aftercare: Secondary | ICD-10-CM | POA: Diagnosis not present

## 2013-11-18 NOTE — Progress Notes (Signed)
Case discussed with Dr. Wilson soon after the resident saw the patient. We reviewed the resident's history and exam and pertinent patient test results. I agree with the assessment, diagnosis, and plan of care documented in the resident's note. 

## 2013-11-25 ENCOUNTER — Encounter: Payer: Self-pay | Admitting: Physical Therapy

## 2013-11-25 ENCOUNTER — Ambulatory Visit: Payer: Medicaid Other | Attending: Orthopedic Surgery | Admitting: Physical Therapy

## 2013-11-25 ENCOUNTER — Telehealth: Payer: Self-pay | Admitting: Nurse Practitioner

## 2013-11-25 DIAGNOSIS — N189 Chronic kidney disease, unspecified: Secondary | ICD-10-CM | POA: Insufficient documentation

## 2013-11-25 DIAGNOSIS — I10 Essential (primary) hypertension: Secondary | ICD-10-CM | POA: Diagnosis not present

## 2013-11-25 DIAGNOSIS — I509 Heart failure, unspecified: Secondary | ICD-10-CM | POA: Insufficient documentation

## 2013-11-25 DIAGNOSIS — Z96642 Presence of left artificial hip joint: Secondary | ICD-10-CM

## 2013-11-25 DIAGNOSIS — Z5189 Encounter for other specified aftercare: Secondary | ICD-10-CM | POA: Insufficient documentation

## 2013-11-25 DIAGNOSIS — Z96649 Presence of unspecified artificial hip joint: Secondary | ICD-10-CM | POA: Insufficient documentation

## 2013-11-25 DIAGNOSIS — G478 Other sleep disorders: Secondary | ICD-10-CM

## 2013-11-25 NOTE — Therapy (Signed)
Physical Therapy Treatment  Patient Details  Name: [name] [name] MRN: [id number] Date of Birth: Apr 29, 1950  Encounter Date: 11/25/2013      PT End of Session - 11/25/13 0926    Visit Number 3   Number of Visits 4   Authorization Type MCD   Authorization Time Period 11/13/13-01/07/14   Authorization - Visit Number 2   Authorization - Number of Visits 4   PT Start Time 0850   PT Stop Time 0930   PT Time Calculation (min) 40 min   Activity Tolerance Patient limited by fatigue      Past Medical History  Diagnosis Date  . Hypertension   . Systolic heart failure     EF is 35 to 40%  . Asthma     'as a child"   . Shortness of breath     "comes on at anytime" (01/18/2013)  . Sickle cell trait   . Stroke 12/1988  . CHF (congestive heart failure)   . Blood clotting disorder     pt. states his family has a history of "slow clotting", had bleeding after oral surgery age 42 but none with extractions 2015 or adult surgeries; thought father (now deceased) had a hx of hemophilia  . History of gout   . Daily headache     are due to the medication he is taking  . Brain aneurysm 01/11/1989  . Arthritis     "left hip; right knee" (10/09/2013)    Past Surgical History  Procedure Laterality Date  . 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 Cardiac catheterization  12/1988; 12/2012  . Multiple tooth extractions  spring 2015    "took out 7"  . Total hip arthroplasty Left 10/08/2013    anterior approach  . Hernia repair  2004?    "above the navel"   . Gum surgery  1966  . Total hip arthroplasty Left 10/08/2013    Procedure: TOTAL HIP ARTHROPLASTY ANTERIOR APPROACH;  Surgeon: Renette Butters, MD;  Location: [facility];  Service: Orthopedics;  Laterality: Left;    There were no vitals taken for this visit.  Visit Diagnosis:  Presence of artificial hip joint, left  Difficulty waking          Adult PT Treatment/Exercise - 11/25/13 0700    Other Lumbar Machine Exercise  NuStep level 7 LEs only 6 min   Hip ADduction Both;1 set;15 reps   Forward Step Up 10 reps  no UE Assist toe tap   Forward Step Up Limitations x20   full step up   Wall Squat 2 sets   Bridges 1 set;Strengthening;Other (comment)  with ball   Straight Leg Raises Both;1 set;Strengthening;Limitations  Lt knee partially flexed due to diff and discomfort   Other Supine Knee Exercises Glute sets x20   Hip ABduction Both;1 set   Clams --  2 setsx 10 bilat, very diff on Lt.           Education - 11/25/13 0940    Education provided Yes   Education Details HEP, single leg stance   Education Details Patient   Methods Tactile cues;Handout   Comprehension Verbalized understanding;Returned demonstration            PT Long Term Goals - 11/25/13 0949    Title demo/verbalize understanding of posture, lifting, body mechanics   Baseline unknown   Time 4   Period Weeks   Status On-going   Title patient will be independent  with adv HEP for hips, core   Baseline has baseline HHPT HEP   Time 4   Period Weeks   Status On-going   Title Patient will walk without device in the home and report no increase in pain   Baseline uses cane or RW in home and community intermittently   Time 4   Period Weeks   Status On-going   Title Patient will increase hip strength to at least 4/5 bilaterally to improve gait   Baseline HIp flexion and abd 3-/5   Time 4   Period Weeks   Status On-going   Title Patient will stand on Lt. LE without UE assist for 15 sec    Baseline less than 5-10 sec   Time 4   Period Weeks   Status On-going          Plan - 11/25/13 0930    Clinical Impression Statement Patient progressing towards goals, goal#5  met    Pt will benefit from skilled therapeutic intervention in order to improve on the following deficits Abnormal gait;Decreased mobility;Decreased strength;Difficulty walking   Rehab Potential Good   PT Frequency Min 1X/week   PT Duration 4 weeks   PT  Treatment/Interventions Therapeutic exercise;Functional mobility training;Therapeutic activities;Patient/family education;Gait training;Stair training;Balance training        Problem List Patient Active Problem List   Diagnosis Date Noted  . Decreased hearing 11/14/2013  . DJD (degenerative joint disease) 10/08/2013  . History of CVA (cerebrovascular accident) 09/11/2013  . Anger 09/11/2013  . Osteoarthritis of hip 05/25/2013  . Gout 05/25/2013  . Hyperglycemia 05/25/2013  . Prostate cancer screening 05/25/2013  . Preventative health care 05/25/2013  . Screening for hyperlipidemia 05/25/2013  . Chronic systolic congestive heart failure 03/25/2013  . Eczema 03/25/2013  . HTN (hypertension) 01/18/2013  . CKD (chronic kidney disease) stage 2, GFR 60-89 ml/min 01/18/2013                                            Sameera Betton 11/25/2013, 10:10 AM     Physical Therapy Treatment  Patient Details  Name: [name] [name] MRN: [id number] Date of Birth: [birth year]  Encounter Date: 11/25/2013      PT End of Session - 11/25/13 0926    Visit Number 3   Number of Visits 4   Authorization Type MCD   Authorization Time Period 11/13/13-01/07/14   Authorization - Visit Number 2   Authorization - Number of Visits 4   PT Start Time 0850   PT Stop Time 0930   PT Time Calculation (min) 40 min   Activity Tolerance Patient limited by fatigue      Past Medical History  Diagnosis Date  . Hypertension   . Systolic heart failure     EF is 35 to 40%  . Asthma     'as a child"   . Shortness of breath     "comes on at anytime" (01/18/2013)  . Sickle cell trait   . Stroke 12/1988  . CHF (congestive heart failure)   . Blood clotting disorder     pt. states his family has a history of "slow clotting", had bleeding after oral surgery age 33 but none with extractions 2015 or adult surgeries; thought father (now deceased) had a hx of hemophilia  .  History of gout   . Daily headache     are due  to the medication he is taking  . Brain aneurysm 01/11/1989  . Arthritis     "left hip; right knee" (10/09/2013)    Past Surgical History  Procedure Laterality Date  . 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 Cardiac catheterization  12/1988; 12/2012  . Multiple tooth extractions  spring 2015    "took out 7"  . Total hip arthroplasty Left 10/08/2013    anterior approach  . Hernia repair  2004?    "above the navel"   . Gum surgery  1966  . Total hip arthroplasty Left 10/08/2013    Procedure: TOTAL HIP ARTHROPLASTY ANTERIOR APPROACH;  Surgeon: Sheral Apley, MD;  Location: [facility];  Service: Orthopedics;  Laterality: Left;    There were no vitals taken for this visit.  Visit Diagnosis:  Presence of artificial hip joint, left  Difficulty walking          Adult PT Treatment/Exercise - 11/25/13 0700    Other Lumbar Machine Exercise NuStep level 7 LEs only 6 min   Hip ABduction Both;1 set;15 reps   Forward Step Up 10 reps  no UE Assist toe tap   Forward Step Up Limitations x20   full step up   Wall Squat 2 sets   Bridges 1 set;Strengthening;Other (comment)  with ball   Straight Leg Raises Both;1 set;Strengthening;Limitations  Lt knee partially flexed due to diff and discomfort   Other Supine Knee Exercises Glute sets x20   Hip ABduction Both;1 set   Clams --  2 setsx 10 bilat, very diff on Lt.           Education - 11/25/13 0940    Education provided Yes   Education Details HEP, single leg stance   Education Details Patient   Methods Tactile cues;Handout   Comprehension Verbalized understanding;Returned demonstration            PT Long Term Goals - 11/25/13 0949    Title demo/verbalize understanding of posture, lifting, body mechanics   Baseline unknown   Time 4   Period Weeks   Status On-going   Title patient will be independent with adv HEP for hips, core   Baseline has baseline HHPT HEP    Time 4   Period Weeks   Status On-going   Title Patient will walk without device in the home and report no increase in pain   Baseline uses cane or RW in home and community intermittently   Time 4   Period Weeks   Status On-going   Title Patient will increase hip strength to at least 4/5 bilaterally to improve gait   Baseline HIp flexion and abd 3-/5   Time 4   Period Weeks   Status On-going   Title Patient will stand on Lt. LE without UE assist for 15 sec    Baseline less than 5-10 sec   Time 4   Period Weeks   Status On-going          Plan - 11/25/13 0930    Clinical Impression Statement Patient progressing towards goals, goal#5  met    Pt will benefit from skilled therapeutic intervention in order to improve on the following deficits Abnormal gait;Decreased mobility;Decreased strength;Difficulty walking   Rehab Potential Good   PT Frequency Min 1X/week   PT Duration 4 weeks   PT Treatment/Interventions Therapeutic exercise;Functional mobility training;Therapeutic activities;Patient/family education;Gait training;Stair training;Balance training        Problem List Patient Active Problem  List   Diagnosis Date Noted  . Decreased hearing 11/14/2013  . DJD (degenerative joint disease) 10/08/2013  . History of CVA (cerebrovascular accident) 09/11/2013  . Anger 09/11/2013  . Osteoarthritis of hip 05/25/2013  . Gout 05/25/2013  . Hyperglycemia 05/25/2013  . Prostate cancer screening 05/25/2013  . Preventative health care 05/25/2013  . Screening for hyperlipidemia 05/25/2013  . Chronic systolic congestive heart failure 03/25/2013  . Eczema 03/25/2013  . HTN (hypertension) 01/18/2013  . CKD (chronic kidney disease) stage 2, GFR 60-89 ml/min 01/18/2013      Marcelene Weidemann 11/25/2013, 10:10 AM

## 2013-11-25 NOTE — Telephone Encounter (Signed)
New Message       Pt calling, has questions for [name] [name] in regards to her referring him to a particular physician and his procedure that he is suppose to have done. Please call pt back and advise.

## 2013-11-25 NOTE — Telephone Encounter (Signed)
[name] [name],  Can you call him - I was under the impression that all of this has already been taken care of by Dr. Acie Fredrickson and Dr. Brantley Stage.

## 2013-11-25 NOTE — Patient Instructions (Signed)
Abduction: Clam (Eccentric) - Side-Lying   Lie on side with knees bent. Lift top knee, keeping feet together. Keep trunk steady. Slowly lower for 3-5 seconds. __10_ reps per set, __2_ sets per day, _5__ days per week. Add ___ lbs when you achieve ___ repetitions.  Copyright  VHI. All rights reserved.   

## 2013-11-25 NOTE — Therapy (Signed)
Physical Therapy Treatment  Patient Details  Name: [name] [name] MRN: [id number] Date of Birth: 1950/05/27  Encounter Date: 11/25/2013      PT End of Session - 11/25/13 0926    Visit Number 3   Number of Visits 4   Authorization Type MCD   Authorization Time Period 11/13/13-01/07/14   Authorization - Visit Number 2   Authorization - Number of Visits 4   PT Start Time 0850   PT Stop Time 0930   PT Time Calculation (min) 40 min   Activity Tolerance Patient limited by fatigue      Past Medical History  Diagnosis Date  . Hypertension   . Systolic heart failure     EF is 35 to 40%  . Asthma     'as a child"   . Shortness of breath     "comes on at anytime" (01/18/2013)  . Sickle cell trait   . Stroke 12/1988  . CHF (congestive heart failure)   . Blood clotting disorder     pt. states his family has a history of "slow clotting", had bleeding after oral surgery age 78 but none with extractions 2015 or adult surgeries; thought father (now deceased) had a hx of hemophilia  . History of gout   . Daily headache     are due to the medication he is taking  . Brain aneurysm 01/11/1989  . Arthritis     "left hip; right knee" (10/09/2013)    Past Surgical History  Procedure Laterality Date  . 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 Cardiac catheterization  12/1988; 12/2012  . Multiple tooth extractions  spring 2015    "took out 7"  . Total hip arthroplasty Left 10/08/2013    anterior approach  . Hernia repair  2004?    "above the navel"   . Gum surgery  1966  . Total hip arthroplasty Left 10/08/2013    Procedure: TOTAL HIP ARTHROPLASTY ANTERIOR APPROACH;  Surgeon: Renette Butters, MD;  Location: [facility];  Service: Orthopedics;  Laterality: Left;    There were no vitals taken for this visit.  Visit Diagnosis:  Presence of artificial hip joint, left  Difficulty waking        [facility] PT Assessment - 11/25/13 1000    Right Hip Flexion 5/5   Right Hip ABduction  4/5   Left Hip Flexion 3-/5   Left Hip ABduction 3-/5   Balance Assessed Yes   Static Standing Balance -  Activities  Single Leg Stance - Right Leg;Single Leg Stance - Left Leg   Static Standing - Comment/# of Minutes Rt.17sec, Lt. 20 sec (Rt. knee limited due to OA, pain)          Adult PT Treatment/Exercise - 11/25/13 0700    Other Lumbar Machine Exercise NuStep level 7 LEs only 6 min   Hip ADduction Both;1 set;15 reps   Forward Step Up 10 reps  no UE Assist toe tap   Forward Step Up Limitations x20   full step up   Wall Squat 2 sets   Bridges 1 set;Strengthening;Other (comment)  with ball   Straight Leg Raises Both;1 set;Strengthening;Limitations  Lt knee partially flexed due to diff and discomfort   Other Supine Knee Exercises Glute sets x20   Hip ABduction Both;1 set   Clams --  2 setsx 10 bilat, very diff on Lt.           Education - 11/25/13 0940  Education provided Yes   Education Details HEP, single leg stance   Education Details Patient   Methods Tactile cues;Handout   Comprehension Verbalized understanding;Returned demonstration            PT Long Term Goals - 11/25/13 0949    Title demo/verbalize understanding of posture, lifting, body mechanics   Baseline unknown   Time 4   Period Weeks   Status On-going   Title patient will be independent with adv HEP for hips, core   Baseline has baseline HHPT HEP   Time 4   Period Weeks   Status On-going   Title Patient will walk without device in the home and report no increase in pain   Baseline uses cane or RW in home and community intermittently   Time 4   Period Weeks   Status On-going   Title Patient will increase hip strength to at least 4/5 bilaterally to improve gait   Baseline HIp flexion and abd 3-/5   Time 4   Period Weeks   Status On-going   Title Patient will stand on Lt. LE without UE assist for 15 sec    Baseline less than 5-10 sec   Time 4   Period Weeks   Status On-going           Plan - 11/25/13 0930    Clinical Impression Statement Patient progressing towards goals, goal#5  met    Pt will benefit from skilled therapeutic intervention in order to improve on the following deficits Abnormal gait;Decreased mobility;Decreased strength;Difficulty walking   Rehab Potential Good   PT Frequency Min 1X/week   PT Duration 4 weeks   PT Treatment/Interventions Therapeutic exercise;Functional mobility training;Therapeutic activities;Patient/family education;Gait training;Stair training;Balance training        Problem List Patient Active Problem List   Diagnosis Date Noted  . Decreased hearing 11/14/2013  . DJD (degenerative joint disease) 10/08/2013  . History of CVA (cerebrovascular accident) 09/11/2013  . Anger 09/11/2013  . Osteoarthritis of hip 05/25/2013  . Gout 05/25/2013  . Hyperglycemia 05/25/2013  . Prostate cancer screening 05/25/2013  . Preventative health care 05/25/2013  . Screening for hyperlipidemia 05/25/2013  . Chronic systolic congestive heart failure 03/25/2013  . Eczema 03/25/2013  . HTN (hypertension) 01/18/2013  . CKD (chronic kidney disease) stage 2, GFR 60-89 ml/min 01/18/2013                                            [name] [name] 11/25/2013, 10:52 AM

## 2013-11-25 NOTE — Telephone Encounter (Signed)
Patient states that when he saw Dr. Brantley Stage in consult, Dr. Brantley Stage was unsure why he was seeing him. Says Dr. Brantley Stage needed to speak with [name] or Dr. Acie Fredrickson to see what was being asked of him. This is confusing because he is scheduled for a fat pad biopsy on 11/13.

## 2013-11-26 ENCOUNTER — Encounter (HOSPITAL_BASED_OUTPATIENT_CLINIC_OR_DEPARTMENT_OTHER)
Admission: RE | Admit: 2013-11-26 | Discharge: 2013-11-26 | Disposition: A | Discharge status: Home / Self Care | Payer: Medicaid Other | Source: Ambulatory Visit | Attending: Surgery | Admitting: Surgery

## 2013-11-26 ENCOUNTER — Encounter (HOSPITAL_BASED_OUTPATIENT_CLINIC_OR_DEPARTMENT_OTHER): Payer: Self-pay | Admitting: *Deleted

## 2013-11-26 DIAGNOSIS — Z01818 Encounter for other preprocedural examination: Secondary | ICD-10-CM | POA: Diagnosis not present

## 2013-11-26 LAB — BASIC METABOLIC PANEL
ANION GAP: 10 (ref 5–15)
BUN: 18 mg/dL (ref 6–23)
CHLORIDE: 105 meq/L (ref 96–112)
CO2: 30 mEq/L (ref 19–32)
Calcium: 9.6 mg/dL (ref 8.4–10.5)
Creatinine, Ser: 1.19 mg/dL (ref 0.50–1.35)
GFR calc Af Amer: 74 mL/min — ABNORMAL LOW (ref >90)
GFR calc non Af Amer: 64 mL/min — ABNORMAL LOW (ref >90)
Glucose, Bld: 110 mg/dL — ABNORMAL HIGH (ref 70–99)
Potassium: 4.5 mEq/L (ref 3.7–5.3)
SODIUM: 145 meq/L (ref 137–147)

## 2013-11-26 LAB — CBC WITH DIFFERENTIAL/PLATELET
Basophils Absolute: 0 10*3/uL (ref 0.0–0.1)
Basophils Relative: 0 % (ref 0–1)
Eosinophils Absolute: 0.1 10*3/uL (ref 0.0–0.7)
Eosinophils Relative: 1 % (ref 0–5)
HEMATOCRIT: 41.4 % (ref 39.0–52.0)
HEMOGLOBIN: 13.8 g/dL (ref 13.0–17.0)
LYMPHS PCT: 17 % (ref 12–46)
Lymphs Abs: 1.6 10*3/uL (ref 0.7–4.0)
MCH: 30.9 pg (ref 26.0–34.0)
MCHC: 33.3 g/dL (ref 30.0–36.0)
MCV: 92.8 fL (ref 78.0–100.0)
MONO ABS: 0.8 10*3/uL (ref 0.1–1.0)
MONOS PCT: 8 % (ref 3–12)
NEUTROS PCT: 74 % (ref 43–77)
Neutro Abs: 7.2 10*3/uL (ref 1.7–7.7)
Platelets: 273 10*3/uL (ref 150–400)
RBC: 4.46 MIL/uL (ref 4.22–5.81)
RDW: 12.9 % (ref 11.5–15.5)
WBC: 9.8 10*3/uL (ref 4.0–10.5)

## 2013-11-26 NOTE — Progress Notes (Signed)
Pt came in -doing well-total hip recently doing well-ef up to 40%-no sob Lab done

## 2013-11-26 NOTE — Telephone Encounter (Signed)
I spoke to Dr. Malachi Paradise and we discussed the fat pad bx.

## 2013-11-26 NOTE — Telephone Encounter (Signed)
Spoke with patient who states when he went to see Dr. Brantley Stage 2 weeks ago Dr. Brantley Stage was not sure why patient was referred to him.  Patient states that since that time, Dr. Brantley Stage evidently talked to someone from our office because he then received a phone call and a procedure has been scheduled.  Patient states he has pre-op work up today.  I advised patient to call or have Dr. Josetta Huddle office call us back with questions or concerns.  Patient verbalized understanding and gratitude.

## 2013-12-02 ENCOUNTER — Encounter: Payer: Self-pay | Admitting: Physical Therapy

## 2013-12-02 ENCOUNTER — Ambulatory Visit: Payer: Medicaid Other | Admitting: Physical Therapy

## 2013-12-02 DIAGNOSIS — Z5189 Encounter for other specified aftercare: Secondary | ICD-10-CM | POA: Diagnosis not present

## 2013-12-02 DIAGNOSIS — Z96642 Presence of left artificial hip joint: Secondary | ICD-10-CM

## 2013-12-02 DIAGNOSIS — G478 Other sleep disorders: Secondary | ICD-10-CM

## 2013-12-02 NOTE — Patient Instructions (Signed)
Heel Raise: Bilateral (Standing)   Rise on balls of feet. Repeat ___10_ times per set. Do __1-2__ sets per session. Do _2___ sessions per day.  Progress to one leg heel lifts  http://orth.exer.us/38   Copyright  VHI. All rights reserved.

## 2013-12-02 NOTE — Therapy (Addendum)
Physical Therapy Treatment  Patient Details  Name: [name] [name] MRN: [id number] Date of Birth: March 24, 1950  Encounter Date: 12/02/2013      PT End of Session - 12/02/13 0939    Visit Number 4   Number of Visits 4   PT Start Time 1093   PT Stop Time 0938   PT Time Calculation (min) 49 min      Past Medical History  Diagnosis Date  . Hypertension   . Systolic heart failure     EF is 35 to 40%  . Asthma     'as a child"   . Shortness of breath     "comes on at anytime" (01/18/2013)  . Sickle cell trait   . Stroke 12/1988  . CHF (congestive heart failure)   . Blood clotting disorder     pt. states his family has a history of "slow clotting", had bleeding after oral surgery age 40 but none with extractions 2015 or adult surgeries; thought father (now deceased) had a hx of hemophilia  . History of gout   . Daily headache     are due to the medication he is taking  . Brain aneurysm 01/11/1989  . Arthritis     "left hip; right knee" (10/09/2013)  . Full dentures   . Wears glasses     Past Surgical History  Procedure Laterality Date  . 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 Cardiac catheterization  12/1988; 12/2012  . Multiple tooth extractions  spring 2015    "took out 7"  . Total hip arthroplasty Left 10/08/2013    anterior approach  . Hernia repair  2004?    "above the navel"   . Gum surgery  1966  . Total hip arthroplasty Left 10/08/2013    Procedure: TOTAL HIP ARTHROPLASTY ANTERIOR APPROACH;  Surgeon: Renette Butters, MD;  Location: [facility];  Service: Orthopedics;  Laterality: Left;    There were no vitals taken for this visit.  Visit Diagnosis:  Presence of artificial hip joint, left  Difficulty waking          [facility] Adult PT Treatment/Exercise - 12/02/13 0902    Posture/Postural Control   Posture/Postural Control --  Showed golfer's lift   Knee/Hip Exercises: Standing   Heel Raises 10 reps  both   Forward Step Up 10 reps   Forward Step  Up Limitations x20    Wall Squat 1 set  cued to lift toes in shoes    Knee/Hip Exercises: Supine   Hip Adduction Isometric Limitations 1 set   Bridges 1 set   Other Supine Knee Exercises Glute sets x20   Knee/Hip Exercises: Sidelying   Hip ADduction Limitations clam, 10          Education - 12/02/13 0938    Education provided Yes   Education Details Also issued posture education handout, able to demonstrate golfer's lift.   Education Details Patient   Methods Explanation;Demonstration;Handout   Comprehension Verbalized understanding;Returned demonstration            PT Long Term Goals - 12/02/13 0945    PT LONG TERM GOAL #1   Title demo/verbalize understanding of posture, lifting, body mechanics   Baseline unknown   Period Weeks   Status Achieved   PT LONG TERM GOAL #2   Title patient will be independent with adv HEP for hips, core   Baseline has baseline HHPT HEP   Period Weeks   Status  Achieved   PT LONG TERM GOAL #3   Title Patient will walk without device in the home and report no increase in pain   Time 4   Period Weeks   Status Partially Met   PT LONG TERM GOAL #4   Title Patient will increase hip strength to at least 4/5 bilaterally to improve gait   Baseline HIp flexion and abd 3-/5   Period Weeks   Status Not Met    No formal MMT but with exercise sidelying Hip 3/5 for strength Lt.      Plan - 12/02/13 0940    Clinical Impression Statement Discharge to home exercise program   Clinical Impression Statement Tolerated treatment well.  Patient uses walker at home if getting up at night.  Does not use assistive devices in community.  Able to go up flight of stairs step over step and rail in community.  Able to go to park and walk around at a festival for 30 minutes.        Problem List Patient Active Problem List   Diagnosis Date Noted  . Decreased hearing 11/14/2013  . DJD (degenerative joint disease) 10/08/2013  . History of CVA (cerebrovascular  accident) 09/11/2013  . Anger 09/11/2013  . Osteoarthritis of hip 05/25/2013  . Gout 05/25/2013  . Hyperglycemia 05/25/2013  . Prostate cancer screening 05/25/2013  . Preventative health care 05/25/2013  . Screening for hyperlipidemia 05/25/2013  . Chronic systolic congestive heart failure 03/25/2013  . Eczema 03/25/2013  . HTN (hypertension) 01/18/2013  . CKD (chronic kidney disease) stage 2, GFR 60-89 ml/min 01/18/2013                                            [name] [name] 12/02/2013, 9:49 AM  PHYSICAL THERAPY DISCHARGE SUMMARY  Visits from Start of Care: 4    Current functional level related to goals / functional outcomes: See above for goals and progress.    Remaining deficits: Unknown, was not discharged until now.    Education / Equipment: HEP, Gait, RICE   Plan: Patient agrees to discharge.  Patient goals were partially met. Patient is being discharged due to financial reasons.  ?????    Raeford Razor, PT 12/30/15 2:22 PM Phone: [phone] Fax: [phone]

## 2013-12-02 NOTE — Therapy (Signed)
  Patient Details  Name: [name] [name] MRN: [id number] Date of Birth: 10/29/1950 PHYSICAL THERAPY DISCHARGE SUMMARY  Visits from Start of Care: 4  Current functional level related to goals / functional outcomes: Patient using cane at times, has difficulty with SLS (10 sec), has intermittent pain in hip, low back.     Remaining deficits: Pain, core, Hip weakness, SLS, endurance   Education / Equipment: Posture, HEP, body mechanics  Plan: Patient agrees to discharge.  Patient goals were partially met. Patient is being discharged due to financial reasons.  ???   [facility] only pays for 3 visits for this diagnosis.   Encounter Date: 12/02/2013  PT Goals - 12/02/13 0945     PT LONG TERM GOAL #1    Title  demo/verbalize understanding of posture, lifting, body mechanics    Baseline  unknown    Period  Weeks    Status  Achieved    PT LONG TERM GOAL #2    Title  patient will be independent with adv HEP for hips, core    Baseline  has baseline HHPT HEP    Period  Weeks    Status  Achieved    PT LONG TERM GOAL #3    Title  Patient will walk without device in the home and report no increase in pain    Time  4    Period  Weeks    Status  Partially Met    PT LONG TERM GOAL #4    Title  Patient will increase hip strength to at least 4/5 bilaterally to improve gait    Baseline  HIp flexion and abd 3-/5    Period  Weeks    Status  Not Met        PAA,JENNIFER 12/02/2013, 10:53 AM

## 2013-12-04 NOTE — Addendum Note (Signed)
Addended by: Hulan Fray on: 12/04/2013 07:46 AM   Modules accepted: Orders

## 2013-12-05 ENCOUNTER — Other Ambulatory Visit: Payer: Self-pay | Admitting: Internal Medicine

## 2013-12-05 DIAGNOSIS — Z9189 Other specified personal risk factors, not elsewhere classified: Secondary | ICD-10-CM

## 2013-12-06 ENCOUNTER — Encounter (HOSPITAL_BASED_OUTPATIENT_CLINIC_OR_DEPARTMENT_OTHER)
Admission: RE | Disposition: A | Discharge status: Home / Self Care | Payer: Self-pay | Source: Ambulatory Visit | Attending: Surgery

## 2013-12-06 ENCOUNTER — Encounter (HOSPITAL_BASED_OUTPATIENT_CLINIC_OR_DEPARTMENT_OTHER): Payer: Self-pay

## 2013-12-06 ENCOUNTER — Ambulatory Visit (HOSPITAL_BASED_OUTPATIENT_CLINIC_OR_DEPARTMENT_OTHER)
Admission: RE | Admit: 2013-12-06 | Discharge: 2013-12-06 | Disposition: A | Discharge status: Home / Self Care | Payer: Medicaid Other | Source: Ambulatory Visit | Attending: Surgery | Admitting: Surgery

## 2013-12-06 ENCOUNTER — Ambulatory Visit (HOSPITAL_BASED_OUTPATIENT_CLINIC_OR_DEPARTMENT_OTHER): Payer: Medicaid Other | Admitting: Anesthesiology

## 2013-12-06 DIAGNOSIS — L309 Dermatitis, unspecified: Secondary | ICD-10-CM | POA: Insufficient documentation

## 2013-12-06 DIAGNOSIS — Z885 Allergy status to narcotic agent status: Secondary | ICD-10-CM | POA: Diagnosis not present

## 2013-12-06 DIAGNOSIS — Z0389 Encounter for observation for other suspected diseases and conditions ruled out: Secondary | ICD-10-CM | POA: Insufficient documentation

## 2013-12-06 DIAGNOSIS — M1610 Unilateral primary osteoarthritis, unspecified hip: Secondary | ICD-10-CM | POA: Insufficient documentation

## 2013-12-06 DIAGNOSIS — I5022 Chronic systolic (congestive) heart failure: Secondary | ICD-10-CM | POA: Diagnosis present

## 2013-12-06 DIAGNOSIS — J45909 Unspecified asthma, uncomplicated: Secondary | ICD-10-CM | POA: Diagnosis not present

## 2013-12-06 DIAGNOSIS — I129 Hypertensive chronic kidney disease with stage 1 through stage 4 chronic kidney disease, or unspecified chronic kidney disease: Secondary | ICD-10-CM | POA: Diagnosis not present

## 2013-12-06 DIAGNOSIS — Z7982 Long term (current) use of aspirin: Secondary | ICD-10-CM | POA: Insufficient documentation

## 2013-12-06 DIAGNOSIS — N182 Chronic kidney disease, stage 2 (mild): Secondary | ICD-10-CM | POA: Insufficient documentation

## 2013-12-06 DIAGNOSIS — Z8673 Personal history of transient ischemic attack (TIA), and cerebral infarction without residual deficits: Secondary | ICD-10-CM | POA: Insufficient documentation

## 2013-12-06 HISTORY — DX: Presence of dental prosthetic device (complete) (partial): Z97.2

## 2013-12-06 HISTORY — DX: Presence of spectacles and contact lenses: Z97.3

## 2013-12-06 HISTORY — DX: Complete loss of teeth, unspecified cause, unspecified class: K08.109

## 2013-12-06 HISTORY — PX: MUSCLE BIOPSY: SHX716

## 2013-12-06 LAB — POCT HEMOGLOBIN-HEMACUE: Hemoglobin: 14.5 g/dL (ref 13.0–17.0)

## 2013-12-06 SURGERY — MUSCLE BIOPSY
Anesthesia: General | Site: Abdomen

## 2013-12-06 MED ORDER — FENTANYL CITRATE 0.05 MG/ML IJ SOLN: 50.0000 ug | INTRAMUSCULAR | Status: DC | PRN

## 2013-12-06 MED ORDER — BUPIVACAINE-EPINEPHRINE (PF) 0.25% -1:200000 IJ SOLN
INTRAMUSCULAR | Status: DC | PRN
Start: 2013-12-06 — End: 2013-12-06
  Administered 2013-12-06: 14.5 mL via PERINEURAL

## 2013-12-06 MED ORDER — EPHEDRINE SULFATE 50 MG/ML IJ SOLN
INTRAMUSCULAR | Status: DC | PRN
  Administered 2013-12-06: 10 mg via INTRAVENOUS

## 2013-12-06 MED ORDER — CEFAZOLIN SODIUM-DEXTROSE 2-3 GM-% IV SOLR
INTRAVENOUS | Status: AC
  Filled 2013-12-06: qty 50

## 2013-12-06 MED ORDER — LIDOCAINE HCL (CARDIAC) 20 MG/ML IV SOLN
INTRAVENOUS | Status: DC | PRN
  Administered 2013-12-06: 80 mg via INTRAVENOUS

## 2013-12-06 MED ORDER — MIDAZOLAM HCL 2 MG/2ML IJ SOLN
INTRAMUSCULAR | Status: AC
  Filled 2013-12-06: qty 2

## 2013-12-06 MED ORDER — DEXAMETHASONE SODIUM PHOSPHATE 4 MG/ML IJ SOLN
INTRAMUSCULAR | Status: DC | PRN
  Administered 2013-12-06: 4 mg via INTRAVENOUS

## 2013-12-06 MED ORDER — ONDANSETRON HCL 4 MG/2ML IJ SOLN
INTRAMUSCULAR | Status: DC | PRN
  Administered 2013-12-06: 4 mg via INTRAVENOUS

## 2013-12-06 MED ORDER — MIDAZOLAM HCL 2 MG/2ML IJ SOLN: 1.0000 mg | INTRAMUSCULAR | Status: DC | PRN

## 2013-12-06 MED ORDER — OXYCODONE HCL 5 MG PO TABS
5.0000 mg | ORAL_TABLET | Freq: Once | ORAL | Status: AC | PRN
  Administered 2013-12-06: 5 mg via ORAL

## 2013-12-06 MED ORDER — HYDROMORPHONE HCL 1 MG/ML IJ SOLN: 0.2500 mg | INTRAMUSCULAR | Status: DC | PRN

## 2013-12-06 MED ORDER — LACTATED RINGERS IV SOLN
INTRAVENOUS | Status: DC
  Administered 2013-12-06: 13:00:00 via INTRAVENOUS

## 2013-12-06 MED ORDER — OXYCODONE HCL 5 MG PO TABS
ORAL_TABLET | ORAL | Status: AC
  Filled 2013-12-06: qty 1

## 2013-12-06 MED ORDER — ONDANSETRON HCL 4 MG/2ML IJ SOLN: 4.0000 mg | Freq: Once | INTRAMUSCULAR | Status: DC | PRN

## 2013-12-06 MED ORDER — PROPOFOL 10 MG/ML IV BOLUS
INTRAVENOUS | Status: DC | PRN
  Administered 2013-12-06: 200 mg via INTRAVENOUS

## 2013-12-06 MED ORDER — OXYCODONE HCL 5 MG/5ML PO SOLN: 5.0000 mg | Freq: Once | ORAL | Status: AC | PRN

## 2013-12-06 MED ORDER — PROPOFOL 10 MG/ML IV BOLUS
INTRAVENOUS | Status: AC
  Filled 2013-12-06: qty 20

## 2013-12-06 MED ORDER — FENTANYL CITRATE 0.05 MG/ML IJ SOLN
INTRAMUSCULAR | Status: AC
  Filled 2013-12-06: qty 4

## 2013-12-06 MED ORDER — CHLORHEXIDINE GLUCONATE 4 % EX LIQD: 1.0000 "application " | Freq: Once | CUTANEOUS | Status: DC

## 2013-12-06 MED ORDER — MIDAZOLAM HCL 5 MG/5ML IJ SOLN
INTRAMUSCULAR | Status: DC | PRN
  Administered 2013-12-06: 2 mg via INTRAVENOUS

## 2013-12-06 MED ORDER — TRAMADOL HCL 50 MG PO TABS: 50.0000 mg | ORAL_TABLET | Freq: Four times a day (QID) | ORAL | Status: DC | PRN

## 2013-12-06 MED ORDER — FENTANYL CITRATE 0.05 MG/ML IJ SOLN
INTRAMUSCULAR | Status: DC | PRN
  Administered 2013-12-06: 100 ug via INTRAVENOUS

## 2013-12-06 MED ORDER — CEFAZOLIN SODIUM-DEXTROSE 2-3 GM-% IV SOLR
2.0000 g | INTRAVENOUS | Status: AC
  Administered 2013-12-06: 2 g via INTRAVENOUS

## 2013-12-06 SURGICAL SUPPLY — 39 items
BLADE SURG 15 STRL LF DISP TIS (BLADE) ×1 IMPLANT
BLADE SURG 15 STRL SS (BLADE) ×1
CHLORAPREP W/TINT 26ML (MISCELLANEOUS) ×2 IMPLANT
COVER BACK TABLE 60X90IN (DRAPES) ×2 IMPLANT
COVER MAYO STAND STRL (DRAPES) ×2 IMPLANT
DECANTER SPIKE VIAL GLASS SM (MISCELLANEOUS) IMPLANT
DEPRESSOR TONGUE BLADE STERILE (MISCELLANEOUS) ×2 IMPLANT
DRAPE PED LAPAROTOMY (DRAPES) ×2 IMPLANT
DRAPE UTILITY XL STRL (DRAPES) ×2 IMPLANT
ELECT COATED BLADE 2.86 ST (ELECTRODE) ×2 IMPLANT
ELECT REM PT RETURN 9FT ADLT (ELECTROSURGICAL) ×2
ELECTRODE REM PT RTRN 9FT ADLT (ELECTROSURGICAL) ×1 IMPLANT
GLOVE BIO SURGEON STRL SZ8 (GLOVE) ×2 IMPLANT
GLOVE BIOGEL M 7.0 STRL (GLOVE) ×2 IMPLANT
GLOVE BIOGEL PI IND STRL 7.5 (GLOVE) ×1 IMPLANT
GLOVE BIOGEL PI IND STRL 8 (GLOVE) ×1 IMPLANT
GLOVE BIOGEL PI INDICATOR 7.5 (GLOVE) ×1
GLOVE BIOGEL PI INDICATOR 8 (GLOVE) ×1
GOWN STRL REUS W/ TWL LRG LVL3 (GOWN DISPOSABLE) ×2 IMPLANT
GOWN STRL REUS W/ TWL XL LVL3 (GOWN DISPOSABLE) ×1 IMPLANT
GOWN STRL REUS W/TWL LRG LVL3 (GOWN DISPOSABLE) ×2
GOWN STRL REUS W/TWL XL LVL3 (GOWN DISPOSABLE) ×1
LIQUID BAND (GAUZE/BANDAGES/DRESSINGS) ×2 IMPLANT
NEEDLE HYPO 25X1 1.5 SAFETY (NEEDLE) ×2 IMPLANT
NS IRRIG 1000ML POUR BTL (IV SOLUTION) ×2 IMPLANT
PACK BASIN DAY SURGERY FS (CUSTOM PROCEDURE TRAY) ×2 IMPLANT
PENCIL BUTTON HOLSTER BLD 10FT (ELECTRODE) ×2 IMPLANT
SLEEVE SCD COMPRESS KNEE MED (MISCELLANEOUS) IMPLANT
SPONGE LAP 4X18 X RAY DECT (DISPOSABLE) ×2 IMPLANT
SUT ETHILON 3 0 PS 1 (SUTURE) IMPLANT
SUT MNCRL AB 4-0 PS2 18 (SUTURE) ×2 IMPLANT
SUT VIC AB 3-0 PS1 18 (SUTURE)
SUT VIC AB 3-0 PS1 18XBRD (SUTURE) IMPLANT
SUT VIC AB 3-0 SH 27 (SUTURE)
SUT VIC AB 3-0 SH 27X BRD (SUTURE) IMPLANT
SUT VICRYL 3-0 CR8 SH (SUTURE) ×2 IMPLANT
SYR CONTROL 10ML LL (SYRINGE) ×2 IMPLANT
TAPE HYPAFIX 4 X10 (GAUZE/BANDAGES/DRESSINGS) IMPLANT
TOWEL OR NON WOVEN STRL DISP B (DISPOSABLE) ×2 IMPLANT

## 2013-12-06 NOTE — H&P (Signed)
H&P   [name] [name] (MR# [id number])      H&P Info    Chief Strategy Officer Note Status Last Update User Last Update Date/Time   Erroll Luna, [name] [name] Signed Erroll Luna, [name] [name] 11/14/2013 1:31 PM    H&P    Expand All Collapse All    [name] [name] 11/12/2013 4:04 PM Location: [facility] Patient #: [id number] DOB: [birth year] Married / Language: English / Race: Black or African American Male  History of Present Illness [name] [name] A. Deng Kemler [name] [name]; 11/12/2013 5:15 PM) Patient words: eval fat abd pt sent at the request of [name] Macho NP for fat pad bx to eveluate for amyloid. Pt had changees on ECHO concerning for this. pt does have CHF. He feels well today.  The patient is a 63 year old male    Other Problems [name] [name], [name] [name]; 11/12/2013 4:05 PM) Arthritis Asthma Cerebrovascular Accident Congestive Heart Failure Hemorrhoids High blood pressure Umbilical Hernia Repair  Past Surgical History [name] [name], [name] [name]; 11/12/2013 4:05 PM) Hip Surgery Left. Vasectomy  Diagnostic Studies History [name] [name], [name] [name]; 11/12/2013 4:05 PM) Colonoscopy >10 years ago  Allergies [name] [name], [name] [name]; 11/12/2013 4:05 PM) HYDROcodone Bitartrate *CHEMICALS*  Medication History [name] [name], [name] [name]; 11/12/2013 4:05 PM) Carvedilol (25MG  Tablet, Oral daily) Active. Colchicine (0.6MG  Tablet, Oral daily) Active. Furosemide (40MG  Tablet, Oral daily) Active. Lisinopril (40MG  Tablet, Oral daily) Active. Spironolactone (25MG  Tablet, Oral daily) Active. Aspirin EC (325MG  Tablet DR, Oral daily) Active. Oxycodone-Acetaminophen (10-325MG  Tablet, Oral as needed) Active.  Social History [name] [name], [name] [name]; 11/12/2013 4:05 PM) Alcohol use Moderate alcohol use. Caffeine use Carbonated beverages, Coffee, Tea. Illicit drug use Prefer to discuss with provider. Tobacco use Former smoker.  Family History [name] [name], [place]; 11/12/2013 4:05 PM) Cerebrovascular Accident Mother. Heart Disease  Mother. Hypertension Brother, Father, Mother. Melanoma Father. Prostate Cancer Brother, Father.  Review of Systems [name] [name] [name] [name]; 11/12/2013 4:05 PM) General Not Present- Appetite Loss, Chills, Fatigue, Fever, Night Sweats, Weight Gain and Weight Loss. Skin Present- Dryness. Not Present- Change in Wart/Mole, Hives, Jaundice, New Lesions, Non-Healing Wounds, Rash and Ulcer. HEENT Present- Hearing Loss. Not Present- Earache, Hoarseness, Nose Bleed, Oral Ulcers, Ringing in the Ears, Seasonal Allergies, Sinus Pain, Sore Throat, Visual Disturbances, Wears glasses/contact lenses and Yellow Eyes. Respiratory Present- Snoring. Not Present- Bloody sputum, Chronic Cough, Difficulty Breathing and Wheezing. Cardiovascular Not Present- Chest Pain, Difficulty Breathing Lying Down, Leg Cramps, Palpitations, Rapid Heart Rate, Shortness of Breath and Swelling of Extremities. Gastrointestinal Present- Constipation. Not Present- Abdominal Pain, Bloating, Bloody Stool, Change in Bowel Habits, Chronic diarrhea, Difficulty Swallowing, Excessive gas, Gets full quickly at meals, Hemorrhoids, Indigestion, Nausea, Rectal Pain and Vomiting. Male Genitourinary Present- Nocturia and Urgency. Not Present- Blood in Urine, Change in Urinary Stream, Frequency, Impotence, Painful Urination and Urine Leakage.   Vitals ([name] [name] [name] [name]; 11/12/2013 4:07 PM) 11/12/2013 4:05 PM Weight: 258 lb Height: 76in Body Surface Area: 2.51 m Body Mass Index: 31.4 kg/m Temp.: 97.40F(Temporal)  Pulse: 81 (Regular)  BP: 160/98 (Sitting, Left Arm, Standard)    Physical Exam ([name] [name] A. [name] Behrens [name] [name]; 11/12/2013 5:16 PM) General Mental Status-Alert. General Appearance-Consistent with stated age. Hydration-Well hydrated. Voice-Normal.  Eye Eyeball - Bilateral-Extraocular movements intact. Sclera/Conjunctiva - Bilateral-No scleral icterus.  Chest and Lung Exam Chest and lung exam reveals -quiet, even and  easy respiratory effort with no use of accessory muscles and on auscultation, normal breath sounds, no adventitious sounds and normal vocal resonance. Inspection Chest Wall - Normal. Back - normal.  Cardiovascular Cardiovascular examination reveals -normal heart sounds,  regular rate and rhythm with no murmurs and normal pedal pulses bilaterally.  Abdomen Note: obese soft non distended   Neurologic Neurologic evaluation reveals -alert and oriented x 3 with no impairment of recent or remote memory. Mental Status-Normal.  Musculoskeletal Normal Exam - Left-Upper Extremity Strength Normal and Lower Extremity Strength Normal. Normal Exam - Right-Upper Extremity Strength Normal and Lower Extremity Strength Normal.    Assessment & Plan ([name] [name] A. [name] Hensch [name] [name]; 11/12/2013 4:48 PM) CHF (CONGESTIVE HEART FAILURE) (428.0  I50.9) FAT PAD (278.1  E65) Impression: fat pad bx requested to rule out amyloidosis. pt has questions and wants to talk to cardiology about this first. he will contact me once they are sure they want to proceed. Current Plans  Written instructions provided discussed biopsy with patient of abdominal fat to aid in dx of amyloid. He would like to talk with cardiologist first before proceeding.   Signed by [name] Daniels, [name] [name] (11/12/2013 5:16 PM)

## 2013-12-06 NOTE — Brief Op Note (Signed)
12/06/2013  2:09 PM  PATIENT:  [name] [name]  63 y.o. male  PRE-OPERATIVE DIAGNOSIS:   Amyloid  POST-OPERATIVE DIAGNOSIS:   Amyloidosis  PROCEDURE:  Procedure(s): FAT PAD BIOPSY (N/A)  SURGEON:  Surgeon(s) and Role:    * Erroll Luna, MD - Primary       ANESTHESIA:   local and general  EBL:  Total I/O In: 700 [I.V.:700] Out: -     LOCAL MEDICATIONS USED:  BUPIVICAINE   SPECIMEN:  Source of Specimen:  abdominal fat bilateral flank   DISPOSITION OF SPECIMEN:  PATHOLOGY  COUNTS:  YES  TOURNIQUET:  * No tourniquets in log *  DICTATION: .Other Dictation: Dictation Number [id number]  PLAN OF CARE: Discharge to home after PACU  PATIENT DISPOSITION:  PACU - hemodynamically stable.   Delay start of Pharmacological VTE agent (>24hrs) due to surgical blood loss or risk of bleeding: not applicable

## 2013-12-06 NOTE — Anesthesia Postprocedure Evaluation (Signed)
  Anesthesia Post-op Note  Patient: [name] [name]  Procedure(s) Performed: Procedure(s): FAT PAD BIOPSY (N/A)  Patient Location: PACU  Anesthesia Type: General   Level of Consciousness: awake, alert  and oriented  Airway and Oxygen Therapy: Patient Spontanous Breathing  Post-op Pain: none  Post-op Assessment: Post-op Vital signs reviewed  Post-op Vital Signs: Reviewed  Last Vitals:  Filed Vitals:   12/06/13 1445  BP: 160/104  Pulse: 66  Temp:   Resp: 15    Complications: No apparent anesthesia complications

## 2013-12-06 NOTE — Anesthesia Procedure Notes (Signed)
Procedure Name: LMA Insertion Date/Time: 12/06/2013 1:32 PM Performed by: Maryella Shivers Pre-anesthesia Checklist: Patient identified, Emergency Drugs available, Suction available and Patient being monitored Patient Re-evaluated:Patient Re-evaluated prior to inductionOxygen Delivery Method: Circle System Utilized Preoxygenation: Pre-oxygenation with 100% oxygen Intubation Type: IV induction Ventilation: Mask ventilation without difficulty LMA: LMA inserted LMA Size: 5.0 Number of attempts: 1 Airway Equipment and Method: bite block Placement Confirmation: positive ETCO2 Tube secured with: Tape Dental Injury: Teeth and Oropharynx as per pre-operative assessment

## 2013-12-06 NOTE — Anesthesia Preprocedure Evaluation (Signed)
Anesthesia Evaluation  Patient identified by MRN, date of birth, ID band Patient awake    Reviewed: Allergy & Precautions, H&P , NPO status , Patient's Chart, lab work & pertinent test results  Airway Mallampati: I  TM Distance: >3 FB Neck ROM: Full    Dental  (+) Teeth Intact, Dental Advisory Given, Missing   Pulmonary  breath sounds clear to auscultation        Cardiovascular hypertension, Pt. on medications +CHF Rhythm:Regular Rate:Normal     Neuro/Psych    GI/Hepatic   Endo/Other    Renal/GU Renal disease     Musculoskeletal   Abdominal   Peds  Hematology   Anesthesia Other Findings   Reproductive/Obstetrics                             Anesthesia Physical Anesthesia Plan  ASA: III  Anesthesia Plan: MAC   Post-op Pain Management:    Induction: Intravenous  Airway Management Planned: Simple Face Mask  Additional Equipment:   Intra-op Plan:   Post-operative Plan:   Informed Consent: I have reviewed the patients History and Physical, chart, labs and discussed the procedure including the risks, benefits and alternatives for the proposed anesthesia with the patient or authorized representative who has indicated his/her understanding and acceptance.   Dental advisory given  Plan Discussed with: CRNA, Anesthesiologist and Surgeon  Anesthesia Plan Comments:         Anesthesia Quick Evaluation

## 2013-12-06 NOTE — Discharge Instructions (Signed)
GENERAL SURGERY: POST OP INSTRUCTIONS ° °1. DIET: Follow a light bland diet the first 24 hours after arrival home, such as soup, liquids, crackers, etc.  Be sure to include lots of fluids daily.  Avoid fast food or heavy meals as your are more likely to get nauseated.   °2. Take your usually prescribed home medications unless otherwise directed. °3. PAIN CONTROL: °a. Pain is best controlled by a usual combination of three different methods TOGETHER: °i. Ice/Heat °ii. Over the counter pain medication °iii. Prescription pain medication °b. Most patients will experience some swelling and bruising around the incisions.  Ice packs or heating pads (30-60 minutes up to 6 times a day) will help. Use ice for the first few days to help decrease swelling and bruising, then switch to heat to help relax tight/sore spots and speed recovery.  Some people prefer to use ice alone, heat alone, alternating between ice & heat.  Experiment to what works for you.  Swelling and bruising can take several weeks to resolve.   °c. It is helpful to take an over-the-counter pain medication regularly for the first few weeks.  Choose one of the following that works best for you: °i. Naproxen (Aleve, etc)  Two 220mg tabs twice a day °ii. Ibuprofen (Advil, etc) Three 200mg tabs four times a day (every meal & bedtime) °iii. Acetaminophen (Tylenol, etc) 500-650mg four times a day (every meal & bedtime) °d. A  prescription for pain medication (such as oxycodone, hydrocodone, etc) should be given to you upon discharge.  Take your pain medication as prescribed.  °i. If you are having problems/concerns with the prescription medicine (does not control pain, nausea, vomiting, rash, itching, etc), please call us [phone] to see if we need to switch you to a different pain medicine that will work better for you and/or control your side effect better. °ii. If you need a refill on your pain medication, please contact your pharmacy.  They will contact our  office to request authorization. Prescriptions will not be filled after 5 pm or on week-ends. °4. Avoid getting constipated.  Between the surgery and the pain medications, it is common to experience some constipation.  Increasing fluid intake and taking a fiber supplement (such as Metamucil, Citrucel, FiberCon, MiraLax, etc) 1-2 times a day regularly will usually help prevent this problem from occurring.  A mild laxative (prune juice, Milk of Magnesia, MiraLax, etc) should be taken according to package directions if there are no bowel movements after 48 hours.   °5. Wash / shower every day.  You may shower over the dressings as they are waterproof.  Continue to shower over incision(s) after the dressing is off. °6. Remove your waterproof bandages 5 days after surgery.  You may leave the incision open to air.  You may have skin tapes (Steri Strips) covering the incision(s).  Leave them on until one week, then remove.  You may replace a dressing/Band-Aid to cover the incision for comfort if you wish.  ° ° ° ° °7. ACTIVITIES as tolerated:   °a. You may resume regular (light) daily activities beginning the next day--such as daily self-care, walking, climbing stairs--gradually increasing activities as tolerated.  If you can walk 30 minutes without difficulty, it is safe to try more intense activity such as jogging, treadmill, bicycling, low-impact aerobics, swimming, etc. °b. Save the most intensive and strenuous activity for last such as sit-ups, heavy lifting, contact sports, etc  Refrain from any heavy lifting or straining until you   are off narcotics for pain control.   °c. DO NOT PUSH THROUGH PAIN.  Let pain be your guide: If it hurts to do something, don't do it.  Pain is your body warning you to avoid that activity for another week until the pain goes down. °d. You may drive when you are no longer taking prescription pain medication, you can comfortably wear a seatbelt, and you can safely maneuver your car and  apply brakes. °e. You may have sexual intercourse when it is comfortable.  °8. FOLLOW UP in our office °a. Please call [facility] at [phone] to set up an appointment to see your surgeon in the office for a follow-up appointment approximately 2-3 weeks after your surgery. °b. Make sure that you call for this appointment the day you arrive home to insure a convenient appointment time. °9. IF YOU HAVE DISABILITY OR FAMILY LEAVE FORMS, BRING THEM TO THE OFFICE FOR PROCESSING.  DO NOT GIVE THEM TO YOUR DOCTOR. ° ° °WHEN TO CALL US [phone]: °1. Poor pain control °2. Reactions / problems with new medications (rash/itching, nausea, etc)  °3. Fever over 101.5 F (38.5 C) °4. Worsening swelling or bruising °5. Continued bleeding from incision. °6. Increased pain, redness, or drainage from the incision °7. Difficulty breathing / swallowing ° ° The clinic staff is available to answer your questions during regular business hours (8:30am-5pm).  Please don’t hesitate to call and ask to speak to one of our nurses for clinical concerns.  ° If you have a medical emergency, go to the nearest emergency room or call 911. ° A surgeon from [facility] is always on call at the hospitals ° ° °[facility] °[address], [address], [place], [place]  [address] ? °MAIN: [phone] ? TOLL FREE: [phone] ?  °FAX [phone] °[email or web address] ° ° °Post Anesthesia Home Care Instructions ° °Activity: °Get plenty of rest for the remainder of the day. A responsible adult should stay with you for 24 hours following the procedure.  °For the next 24 hours, DO NOT: °-Drive a car °-Operate machinery °-Drink alcoholic beverages °-Take any medication unless instructed by your physician °-Make any legal decisions or sign important papers. ° °Meals: °Start with liquid foods such as gelatin or soup. Progress to regular foods as tolerated. Avoid greasy, spicy, heavy foods. If nausea and/or  vomiting occur, drink only clear liquids until the nausea and/or vomiting subsides. Call your physician if vomiting continues. ° °Special Instructions/Symptoms: °Your throat may feel dry or sore from the anesthesia or the breathing tube placed in your throat during surgery. If this causes discomfort, gargle with warm salt water. The discomfort should disappear within 24 hours. ° °

## 2013-12-06 NOTE — H&P (Signed)
H&P   [name] [name] (MR# [id number])      H&P Info    Chief Strategy Officer Note Status Last Update User Last Update Date/Time   Erroll Luna, MD Signed Erroll Luna, MD 11/14/2013 1:31 PM    H&P    Expand All Collapse All    [name] [name] 11/12/2013 4:04 PM Location: [facility] Patient #: [id number] DOB: August 04, 1950 Married / Language: English / Race: Black or African American Male  History of Present Illness [name] Moores A. Ceirra Belli MD; 11/12/2013 5:15 PM) Patient words: eval fat abd pt sent at the request of Remer Macho NP for fat pad bx to eveluate for amyloid. Pt had changees on ECHO concerning for this. pt does have CHF. He feels well today.  The patient is a 63 year old male    Other Problems Marjean Donna, [place]; 11/12/2013 4:05 PM) Arthritis Asthma Cerebrovascular Accident Congestive Heart Failure Hemorrhoids High blood pressure Umbilical Hernia Repair  Past Surgical History Marjean Donna, CMA; 11/12/2013 4:05 PM) Hip Surgery Left. Vasectomy  Diagnostic Studies History Marjean Donna, [place]; 11/12/2013 4:05 PM) Colonoscopy >10 years ago  Allergies Marjean Donna, CMA; 11/12/2013 4:05 PM) HYDROcodone Bitartrate *CHEMICALS*  Medication History Davy Pique Bynum, CMA; 11/12/2013 4:05 PM) Carvedilol (25MG  Tablet, Oral daily) Active. Colchicine (0.6MG  Tablet, Oral daily) Active. Furosemide (40MG  Tablet, Oral daily) Active. Lisinopril (40MG  Tablet, Oral daily) Active. Spironolactone (25MG  Tablet, Oral daily) Active. Aspirin EC (325MG  Tablet DR, Oral daily) Active. Oxycodone-Acetaminophen (10-325MG  Tablet, Oral as needed) Active.  Social History Marjean Donna, [place]; 11/12/2013 4:05 PM) Alcohol use Moderate alcohol use. Caffeine use Carbonated beverages, Coffee, Tea. Illicit drug use Prefer to discuss with provider. Tobacco use Former smoker.  Family History Marjean Donna, [place]; 11/12/2013 4:05 PM) Cerebrovascular Accident Mother. Heart Disease  Mother. Hypertension Brother, Father, Mother. Melanoma Father. Prostate Cancer Brother, Father.  Review of Systems Davy Pique Bynum CMA; 11/12/2013 4:05 PM) General Not Present- Appetite Loss, Chills, Fatigue, Fever, Night Sweats, Weight Gain and Weight Loss. Skin Present- Dryness. Not Present- Change in Wart/Mole, Hives, Jaundice, New Lesions, Non-Healing Wounds, Rash and Ulcer. HEENT Present- Hearing Loss. Not Present- Earache, Hoarseness, Nose Bleed, Oral Ulcers, Ringing in the Ears, Seasonal Allergies, Sinus Pain, Sore Throat, Visual Disturbances, Wears glasses/contact lenses and Yellow Eyes. Respiratory Present- Snoring. Not Present- Bloody sputum, Chronic Cough, Difficulty Breathing and Wheezing. Cardiovascular Not Present- Chest Pain, Difficulty Breathing Lying Down, Leg Cramps, Palpitations, Rapid Heart Rate, Shortness of Breath and Swelling of Extremities. Gastrointestinal Present- Constipation. Not Present- Abdominal Pain, Bloating, Bloody Stool, Change in Bowel Habits, Chronic diarrhea, Difficulty Swallowing, Excessive gas, Gets full quickly at meals, Hemorrhoids, Indigestion, Nausea, Rectal Pain and Vomiting. Male Genitourinary Present- Nocturia and Urgency. Not Present- Blood in Urine, Change in Urinary Stream, Frequency, Impotence, Painful Urination and Urine Leakage.   Vitals (Sonya Bynum CMA; 11/12/2013 4:07 PM) 11/12/2013 4:05 PM Weight: 258 lb Height: 76in Body Surface Area: 2.51 m Body Mass Index: 31.4 kg/m Temp.: 97.24F(Temporal)  Pulse: 81 (Regular)  BP: 160/98 (Sitting, Left Arm, Standard)    Physical Exam (Katisha Shimizu A. Min Collymore MD; 11/12/2013 5:16 PM) General Mental Status-Alert. General Appearance-Consistent with stated age. Hydration-Well hydrated. Voice-Normal.  Eye Eyeball - Bilateral-Extraocular movements intact. Sclera/Conjunctiva - Bilateral-No scleral icterus.  Chest and Lung Exam Chest and lung exam reveals -quiet, even and  easy respiratory effort with no use of accessory muscles and on auscultation, normal breath sounds, no adventitious sounds and normal vocal resonance. Inspection Chest Wall - Normal. Back - normal.  Cardiovascular Cardiovascular examination reveals -normal heart sounds,  regular rate and rhythm with no murmurs and normal pedal pulses bilaterally.  Abdomen Note: obese soft non distended   Neurologic Neurologic evaluation reveals -alert and oriented x 3 with no impairment of recent or remote memory. Mental Status-Normal.  Musculoskeletal Normal Exam - Left-Upper Extremity Strength Normal and Lower Extremity Strength Normal. Normal Exam - Right-Upper Extremity Strength Normal and Lower Extremity Strength Normal.    Assessment & Plan (Jonluke Cobbins A. Zamier Eggebrecht MD; 11/12/2013 4:48 PM) CHF (CONGESTIVE HEART FAILURE) (428.0  I50.9) FAT PAD (278.1  E65) Impression: fat pad bx requested to rule out amyloidosis. pt has questions and wants to talk to cardiology about this first. he will contact me once they are sure they want to proceed. Current Plans  Written instructions provided discussed biopsy with patient of abdominal fat to aid in dx of amyloid. He would like to talk with cardiologist first before proceeding.   Signed by Turner Daniels, MD (11/12/2013 5:16 PM)

## 2013-12-06 NOTE — Interval H&P Note (Signed)
History and Physical Interval Note:  12/06/2013 1:13 PM  [name] [name]  has presented today for surgery, with the diagnosis of  Amyloid  The various methods of treatment have been discussed with the patient and family. After consideration of risks, benefits and other options for treatment, the patient has consented to  Procedure(s): FAT PAD BIOPSY (N/A) as a surgical intervention .  The patient's history has been reviewed, patient examined, no change in status, stable for surgery.  I have reviewed the patient's chart and labs.  Questions were answered to the patient's satisfaction.     [name] [name] A.

## 2013-12-06 NOTE — Transfer of Care (Signed)
Immediate Anesthesia Transfer of Care Note  Patient: [name] [name]  Procedure(s) Performed: Procedure(s): FAT PAD BIOPSY (N/A)  Patient Location: PACU  Anesthesia Type:General  Level of Consciousness: sedated  Airway & Oxygen Therapy: Patient Spontanous Breathing and Patient connected to face mask oxygen  Post-op Assessment: Report given to PACU RN and Post -op Vital signs reviewed and stable  Post vital signs: Reviewed and stable  Complications: No apparent anesthesia complications

## 2013-12-09 ENCOUNTER — Encounter (HOSPITAL_BASED_OUTPATIENT_CLINIC_OR_DEPARTMENT_OTHER): Payer: Self-pay | Admitting: Surgery

## 2013-12-10 NOTE — Op Note (Signed)
NAME:  [name] [name], [name] [name] NO.:  [id number]  MEDICAL RECORD NO.:  [id number]  LOCATION:                               FACILITY:  [facility]  PHYSICIAN:  Marcello Moores A. Kashara Blocher, M.D.DATE OF BIRTH:  1950-02-23  DATE OF PROCEDURE:  12/06/2013 DATE OF DISCHARGE:  12/06/2013                              OPERATIVE REPORT   PREOPERATIVE DIAGNOSIS:  History of congestive heart failure with concern for amyloidosis.  POSTOPERATIVE DIAGNOSIS:  History of congestive heart failure with concern for amyloidosis.  PROCEDURE:  Bilateral abdominal fat pad biopsy.  SURGEON:  Marcello Moores A. Jessalyn Hinojosa, M.D.  ANESTHESIA:  LMA with 0.25% Sensorcaine local with epinephrine.  EBL:  Minimal.  SPECIMENS:  Abdominal fat from anterior abdominal wall, bilateral lower quadrants sent to Pathology.  DRAINS:  None.  INDICATIONS FOR PROCEDURE:  The patient is a 63 year old male who is in need of a fat pad biopsy due to concerns over possible amyloidosis contributing to his congestive heart failure.  He was seen by his cardiologist and we saw him in the office.  We discussed the case with his cardiologist, Dr. Johnsie Cancel and felt that fat pad biopsy would be the least invasive way to determine if amyloidosis was certainly contributing to his heart failure.  I discussed this with the patient the risk of bleeding, infection, organ injury, nerve injury, the need for more surgery if necessary, pain, complications of wound breakdown and infection, the need for other operative procedures, death, DVT, and exacerbation of underlying medical condition.  Alternatives were discussed.  He understood the above and wished to proceed.  DESCRIPTION OF PROCEDURE:  The patient was seen in the holding area and questions were answered.  He was taken back to the operating room, placed supine where LMA anesthesia was initiated.  Time-out was done. After a sterile prep and drape, he received 2 g of antibiotics.  Proper patient and  procedure verified.  We chose the right lateral abdominal wall region.  A 3-cm incision was made and a core of 3 x 7 cm of abdominal fat removed down to the fascia.  This was passed off the field.  This wound was closed in right lower quadrant with 3-0 Vicryl and 4-0 Monocryl.  The left lower quadrant was dressed the same way.  A 3-cm incision was made in an area of 3 x 7 cm of fat removed all the way down to the abdominal wall and passed off the field.  Wound was closed in a similar fashion with 3-0 Vicryl and 4-0 Monocryl.  Dermabond was applied to both.  All final counts of sponge, needle, and instruments were found to be correct at this portion of the case.  The patient was awoke, extubated, and taken to recovery in satisfactory condition.     Marveline Profeta A. Cidney Kirkwood, M.D.     TAC/MEDQ  D:  12/06/2013  T:  12/07/2013  Job:  [id number]

## 2013-12-13 ENCOUNTER — Encounter: Payer: Self-pay | Admitting: Internal Medicine

## 2013-12-13 ENCOUNTER — Ambulatory Visit (INDEPENDENT_AMBULATORY_CARE_PROVIDER_SITE_OTHER): Payer: Medicaid Other | Admitting: Internal Medicine

## 2013-12-13 VITALS — BP 118/64 | HR 69 | Temp 97.6°F | Ht 76.0 in | Wt 266.5 lb

## 2013-12-13 DIAGNOSIS — R195 Other fecal abnormalities: Secondary | ICD-10-CM

## 2013-12-13 DIAGNOSIS — I5022 Chronic systolic (congestive) heart failure: Secondary | ICD-10-CM

## 2013-12-13 DIAGNOSIS — M109 Gout, unspecified: Secondary | ICD-10-CM

## 2013-12-13 DIAGNOSIS — IMO0001 Reserved for inherently not codable concepts without codable children: Secondary | ICD-10-CM

## 2013-12-13 DIAGNOSIS — H9192 Unspecified hearing loss, left ear: Secondary | ICD-10-CM

## 2013-12-13 DIAGNOSIS — Z0000 Encounter for general adult medical examination without abnormal findings: Secondary | ICD-10-CM

## 2013-12-13 DIAGNOSIS — K921 Melena: Secondary | ICD-10-CM | POA: Insufficient documentation

## 2013-12-13 DIAGNOSIS — R0989 Other specified symptoms and signs involving the circulatory and respiratory systems: Secondary | ICD-10-CM

## 2013-12-13 LAB — CBC
HCT: 41.8 % (ref 39.0–52.0)
Hemoglobin: 13.8 g/dL (ref 13.0–17.0)
MCH: 30.9 pg (ref 26.0–34.0)
MCHC: 33 g/dL (ref 30.0–36.0)
MCV: 93.7 fL (ref 78.0–100.0)
MPV: 10.8 fL (ref 9.4–12.4)
PLATELETS: 264 10*3/uL (ref 150–400)
RBC: 4.46 MIL/uL (ref 4.22–5.81)
RDW: 13.4 % (ref 11.5–15.5)
WBC: 9.4 10*3/uL (ref 4.0–10.5)

## 2013-12-13 MED ORDER — COLCHICINE 0.6 MG PO TABS: 0.6000 mg | ORAL_TABLET | Freq: Every day | ORAL | Status: DC

## 2013-12-13 NOTE — Assessment & Plan Note (Addendum)
12/06/2013 Fat pad biopsy:  Fatty tissue, Abdominal - BENIGN FIBROVASCULAR AND ADIPOSE SOFT TISSUE. - NEGATIVE FOR AMYLOID DEPOSITION. - SEE COMMENT.

## 2013-12-13 NOTE — Assessment & Plan Note (Addendum)
No recent gout attack.  He has been compliant with allopurinol and colchicine.  He will need to remain on colchicine until uric acid is < 6 for six months. - check uric acid today and titrate allopurinol accordingly - decrease colchicine to 0.6mg  daily (he has only been taking it once but it was initially prescribed BID)

## 2013-12-13 NOTE — Progress Notes (Signed)
   Subjective:    Patient ID: [name] [name], male    DOB: 1950/02/11, 63 y.o.   MRN: [id number]  HPI Comments: [name] [name] is a 63 year old male with a PMH of chronic systolic HF (EF 81-15% June 2015; likely improved to 35% per cardiology interpretation), HTN, prior CVA, OA and CKD2. He is here for follow-up of his uric acid level 1 month after starting allopurinol.  Please see problem based assessment and plan for update of patient's chronic medical conditions.      Review of Systems  Constitutional: Negative for fever, chills and appetite change.  Respiratory: Negative for shortness of breath.   Cardiovascular: Negative for chest pain, palpitations and leg swelling.  Gastrointestinal: Positive for blood in stool. Negative for nausea, vomiting, abdominal pain, diarrhea and constipation.       Has noticed red blood in stool on two occasions recently.  Hx of hemorrhoids.  Genitourinary: Negative for dysuria.  Neurological: Positive for light-headedness and headaches. Negative for syncope.       Related to BP meds.       Objective:   Physical Exam  Constitutional: He is oriented to person, place, and time. He appears well-developed. No distress.  HENT:  Head: Normocephalic and atraumatic.  Mouth/Throat: Oropharynx is clear and moist. No oropharyngeal exudate.  Eyes: EOM are normal. Pupils are equal, round, and reactive to light.  Cardiovascular: Normal rate, regular rhythm and normal heart sounds.  Exam reveals no gallop and no friction rub.   No murmur heard. Carotid and radial pulses equal, 2+, no carotid bruits appreciated.  Pulmonary/Chest: Breath sounds normal. No respiratory distress. He has no wheezes. He has no rales.  Abdominal: Soft. Bowel sounds are normal. He exhibits no distension. There is no tenderness. There is no rebound.  Musculoskeletal: Normal range of motion. He exhibits no edema or tenderness.  Neurological: He is alert and oriented to person, place, and time.  No cranial nerve deficit.  Similar to prior exam, he reports decreased hearing on the left.    Skin: Skin is warm. He is not diaphoretic.  Psychiatric: He has a normal mood and affect. His behavior is normal.  Vitals reviewed.         Assessment & Plan:  Please see problem based assessment and plan.

## 2013-12-13 NOTE — Assessment & Plan Note (Signed)
He noticed two episodes of blood in stool.  One episode since he saw me last, he thinks it was about 2-3 weeks ago.  No blood or dark stools recently.  No NSAID use.  He has hx of hemorrhoids.  He was taking Vicodin for pain after hip surgery and had some constipation and hard stools which likely irritated his hemorrhoids and cause bleeding.  He is due for colonoscopy. - CBC today - refer for colonoscopy

## 2013-12-13 NOTE — Patient Instructions (Addendum)
General Instructions: 1. I will call you if there are changes to your allopurinol dose.  Otherwise, keep taking the same dose of allopurinol.  2. Please take all medications as prescribed.   3. If you have worsening of your symptoms or new symptoms arise, please call the clinic ([phone]), or go to the ER immediately if symptoms are severe.  Please try to bring all your medicines next time. This helps [place] take good care of you and stops mistakes from medicines that could hurt you.  Please see me again in 3 months or sooner if needed.  Treatment Goals:  Goals (1 Years of Data) as of 12/13/13          As of Today 12/06/13 12/06/13 12/06/13 12/06/13     Blood Pressure   . Blood Pressure < 150/90  159/84 136/71 160/104 147/90 145/72      Progress Toward Treatment Goals:  Treatment Goal 04/19/2013  Blood pressure at goal    Self Care Goals & Plans:  Self Care Goal 12/13/2013  Manage my medications take my medicines as prescribed; bring my medications to every visit; refill my medications on time; follow the sick day instructions if I am sick  Monitor my health keep track of my blood pressure  Eat healthy foods eat more vegetables; eat baked foods instead of fried foods; eat foods that are low in salt; eat fruit for snacks and desserts; eat smaller portions  Be physically active find an activity I enjoy    No flowsheet data found.   Care Management & Community Referrals:  Referral 03/25/2013  Referrals made for care management support financial counselor

## 2013-12-14 LAB — URIC ACID: Uric Acid, Serum: 6.4 mg/dL (ref 4.0–7.8)

## 2013-12-16 ENCOUNTER — Other Ambulatory Visit: Payer: Self-pay | Admitting: Internal Medicine

## 2013-12-16 DIAGNOSIS — IMO0001 Reserved for inherently not codable concepts without codable children: Secondary | ICD-10-CM | POA: Insufficient documentation

## 2013-12-16 DIAGNOSIS — K921 Melena: Secondary | ICD-10-CM

## 2013-12-16 NOTE — Assessment & Plan Note (Signed)
He has audiology appt scheduled on 12/26/13.

## 2013-12-16 NOTE — Assessment & Plan Note (Addendum)
Right arm BP 98/56, left arm BP 118/64.  > 24mmHg difference in SBP concerning for PAD or subclavian steal (he has c/o hearing loss and lightheadness (after he takes BP meds)).   Radial and carotid pulses equal, 2+, no bruits appreciated. - upper extremity arterial duplex - vertebral/carotid artery duplex

## 2013-12-16 NOTE — Assessment & Plan Note (Signed)
Refer for colonoscopy 

## 2013-12-16 NOTE — Progress Notes (Signed)
Internal Medicine Clinic Attending  Case discussed with Dr. Wilson soon after the resident saw the patient.  We reviewed the resident's history and exam and pertinent patient test results.  I agree with the assessment, diagnosis, and plan of care documented in the resident's note.  

## 2013-12-17 ENCOUNTER — Institutional Professional Consult (permissible substitution): Payer: Self-pay | Admitting: Neurology

## 2013-12-17 ENCOUNTER — Other Ambulatory Visit: Payer: Self-pay | Admitting: Internal Medicine

## 2013-12-17 DIAGNOSIS — IMO0001 Reserved for inherently not codable concepts without codable children: Secondary | ICD-10-CM

## 2013-12-18 ENCOUNTER — Telehealth: Payer: Self-pay | Admitting: Internal Medicine

## 2013-12-18 ENCOUNTER — Other Ambulatory Visit: Payer: Self-pay | Admitting: Internal Medicine

## 2013-12-18 DIAGNOSIS — M1A9XX0 Chronic gout, unspecified, without tophus (tophi): Secondary | ICD-10-CM

## 2013-12-18 MED ORDER — ALLOPURINOL 150 MG HALF TABLET: 150.0000 mg | ORAL_TABLET | Freq: Every day | ORAL | Status: DC

## 2013-12-18 NOTE — Telephone Encounter (Signed)
I called to inform [name] [name] that I have sent in a new rx for allopurinol at an increased dose of 150mg  (half or a 300mg  tablet).  He agrees to pick up new rx.  I also informed him that someone will call him to arrange vascular studies and colonoscopy.  Duwaine Maxin, DO

## 2013-12-25 ENCOUNTER — Institutional Professional Consult (permissible substitution): Payer: Medicaid Other | Admitting: Neurology

## 2013-12-26 ENCOUNTER — Ambulatory Visit: Payer: Medicaid Other | Attending: Internal Medicine | Admitting: Audiology

## 2013-12-26 DIAGNOSIS — I509 Heart failure, unspecified: Secondary | ICD-10-CM | POA: Insufficient documentation

## 2013-12-26 DIAGNOSIS — H905 Unspecified sensorineural hearing loss: Secondary | ICD-10-CM

## 2013-12-26 DIAGNOSIS — I10 Essential (primary) hypertension: Secondary | ICD-10-CM | POA: Insufficient documentation

## 2013-12-26 DIAGNOSIS — Z96649 Presence of unspecified artificial hip joint: Secondary | ICD-10-CM | POA: Diagnosis not present

## 2013-12-26 DIAGNOSIS — N189 Chronic kidney disease, unspecified: Secondary | ICD-10-CM | POA: Diagnosis not present

## 2013-12-26 DIAGNOSIS — Z5189 Encounter for other specified aftercare: Secondary | ICD-10-CM | POA: Diagnosis present

## 2013-12-26 NOTE — Procedures (Signed)
   [facility] [address] [place], [place]  [address] [place] EVALUATION  Patient Name: [name] [name]  Medical Record Number:  [id number] Date of Birth:  1950-09-28     Date of Test:  12/26/2013  HISTORY:  [name] [name], a 63 y.o. old male was seen for audiological evaluation upon referral of [name] Maxin, DO.  The patient reported a gradual decrease of hearing on the left side for about 5 years.  A hearing test conducted over ten years ago revealed normal hearing according to the patient.  There is no history of ear infections,  tinnitus, facial numbness or dizziness. He does report familial history of hearing loss (father), stroke in 1990 (right side)  and difficulty with understanding conversational speech (wife) and listening to the TV.    REPORT OF PAIN:  None  EVALUATION:   Air and bone conduction audiometry from 500Hz  - 8000Hz  utilizing insert  earphones revealed a mild to severe sensory neural hearing loss bilaterally.   Speech reception thresholds were consistent with the pure tone results indicative of good test reliability.  Speech recognition testing was conducted in each ear independently, at a comfortable listening level (70dBHL) and indicated 96% and 88% in the right and left ears respectively.  Speech recognition abilities while in the presence of a soft background noise (s/n+5) were also evaluated.  Under this condition, scores were 68% in the right and 40% in the left ear.   Impedance audiometry was utilized and a Type A was obtained on the right side and a Type A was obtained on the left side suggesting good middle ear function bilaterally.  Acoustic reflexes were tested from 500Hz  - 4000Hz  and were present on the right side and present on the left side.  Distortion Product Otoacoustic Emissions were tested from 2000Hz  - 10000Hz  and were absent on the right side and absent on the left side, indicative of poor  outer hair cell function within in the inner ear.  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  Speech understanding is affected for conversational speech in quiet and significantly affected when a soft background noise is present.  Middle ear function is good on both sides. There is no sign of retrocochlear pathology.  RECOMMENDATIONS:    1. Hearing should be monitored at least on an annual basis and sooner should there be an increase in symptoms.  2. A hearing aid evaluation and trial fitting is strongly suggested.  The patient was given a list of local providers. 3. In addition, there are also ways to improve one's listening environment. Such as:  (A) When in a restaurant, sit with your back against a perimeter wall, thus reducing the extraneous noise.   (B) When in meetings, do not sit near an open doorway and position yourself within 8-10ft of the speaker. Front and center is typically best.  (C) Eliminate background noises at home such as the television, dishwasher, etc when having important conversations.  (D) Ask family members to face you when they speak and not to drop their heads or turn and walkway while still speaking.   4. Because the patient has a weakened auditory system he is more at risk from damage of noise exposure than someone with a healthy auditory system and therefore should avoid excessive nose exposure if possible or certainly utilize ear protection.     Ivonne Andrew Pugh, Au.D. Doctor of Audiology CCC-A

## 2013-12-26 NOTE — Patient Instructions (Signed)
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  Speech understanding is affected for conversational speech in quiet and significantly affected when a soft background noise is present.  Middle ear function is good on both sides. There is no sign of retrocochlear pathology.  RECOMMENDATIONS:    1. Hearing should be monitored at least on an annual basis and sooner should there be an increase in symptoms.  2. A hearing aid evaluation and trial fitting is strongly suggested.  The patient was given a list of local providers. 3. In addition, there are also ways to improve one's listening environment. Such as:  (A) When in a restaurant, sit with your back against a perimeter wall, thus reducing the extraneous noise.   (B) When in meetings, do not sit near an open doorway and position yourself within 8-10ft of the speaker. Front and center is typically best.  (C) Eliminate background noises at home such as the television, dishwasher, etc when having important conversations.  (D) Ask family members to face you when they speak and not to drop their heads or turn and walkway while still speaking.   4. Because the patient has a weakened auditory system he is more at risk from damage of noise exposure than someone with a healthy auditory system and therefore should avoid excessive nose exposure if possible or certainly utilize ear protection.

## 2013-12-27 ENCOUNTER — Encounter: Payer: Self-pay | Admitting: Gastroenterology

## 2013-12-30 ENCOUNTER — Encounter: Payer: Self-pay | Admitting: Neurology

## 2013-12-30 ENCOUNTER — Ambulatory Visit (INDEPENDENT_AMBULATORY_CARE_PROVIDER_SITE_OTHER): Payer: Medicaid Other | Admitting: Neurology

## 2013-12-30 VITALS — BP 126/58 | HR 71 | Temp 97.5°F | Ht 75.5 in | Wt 270.0 lb

## 2013-12-30 DIAGNOSIS — R51 Headache: Secondary | ICD-10-CM

## 2013-12-30 DIAGNOSIS — R351 Nocturia: Secondary | ICD-10-CM

## 2013-12-30 DIAGNOSIS — R519 Headache, unspecified: Secondary | ICD-10-CM

## 2013-12-30 DIAGNOSIS — E669 Obesity, unspecified: Secondary | ICD-10-CM

## 2013-12-30 DIAGNOSIS — I5022 Chronic systolic (congestive) heart failure: Secondary | ICD-10-CM

## 2013-12-30 DIAGNOSIS — R0683 Snoring: Secondary | ICD-10-CM

## 2013-12-30 NOTE — Progress Notes (Signed)
Subjective:    Patient ID: [name] [name] is a 63 y.o. [name].  HPI     Star Age, MD, PhD [facility] [address] [place], [place] [address]  Dear Dr. Redmond Pulling,  I saw your patient, [name] [name], upon your kind request in my neurologic clinic today for initial consultation of his sleep disorder, in particular, concern for underlying obstructive sleep apnea. The patient is unaccompanied today. As you know, [name] [name] is a 63 year old right-handed [name] with an underlying complex medical history of hypertension, systolic heart failure, sickle cell trait, history of stroke, gout, history of brain aneurysm, osteoarthritis, status post left total hip replacement surgery in September 2015, and obesity, who reports snoring and daytime tiredness. He has not worked since May 2015. He was diagnosed with systolic CHF on 61/02/77. 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  [place]. He drinks little caffeine. He does not smoke cigarettes. He drinks no alcohol.   His typical bedtime is reported to be around 9:30 PM and usual wake time is around 4:30 AM. Sleep onset typically occurs with some delay. He reports feeling adequately rested upon awakening. He wakes up on an average 3 times in the middle of the night and has to go to the bathroom 3 times on a typical night. He reports frequent morning headaches.  He reports excessive daytime somnolence (EDS) and His Epworth Sleepiness Score (ESS) is 8/24 today. He has not fallen asleep while driving. The patient has been taking a planned nap, which is usually after lunch.  He has been known to snore for the past many years. Snoring is reportedly moderate to loud. He denies gasping sensations. There is no report of nighttime reflux, with no nighttime cough experienced. The patient has not noted any RLS symptoms and is not  known to kick while asleep or before falling asleep. There is no family history of RLS or OSA.  He denies cataplexy, sleep paralysis, hypnagogic or hypnopompic hallucinations, or sleep attacks. He does not report any vivid dreams, nightmares, dream enactments, or parasomnias, such as sleep talking or sleep walking. The patient has had a sleep study some 10 years ago, but has gained significant weight since then.   His bedroom is usually dark and cool. There is no TV in the bedroom.   His Past Medical History Is Significant For: Past Medical History  Diagnosis Date  . Hypertension   . Systolic heart failure     EF is 35 to 40%  . Asthma     'as a child"   . Shortness of breath     "comes on at anytime" (01/18/2013)  . Sickle cell trait   . Stroke 12/1988  . CHF (congestive heart failure)   . Blood clotting disorder     pt. states his family has a history of "slow clotting", had bleeding after oral surgery age 32 but none with extractions 2015 or adult surgeries; thought father (now deceased) had a hx of hemophilia  . History of gout   . Daily headache     are due to the medication he is taking  . Brain aneurysm 01/11/1989  . Arthritis     "left hip; right knee" (10/09/2013)  . Full dentures   . Wears glasses     His Past Surgical History Is Significant For: Past Surgical History  Procedure  Laterality Date  . 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 Cardiac catheterization  12/1988; 12/2012  . Multiple tooth extractions  spring 2015    "took out 7"  . Total hip arthroplasty Left 10/08/2013    anterior approach  . Hernia repair  2004?    "above the navel"   . Gum surgery  1966  . Total hip arthroplasty Left 10/08/2013    Procedure: TOTAL HIP ARTHROPLASTY ANTERIOR APPROACH;  Surgeon: Renette Butters, MD;  Location: [place];  Service: Orthopedics;  Laterality: Left;  [name] Muscle biopsy N/A 12/06/2013    Procedure: FAT PAD BIOPSY;  Surgeon: Erroll Luna, MD;  [facility];  Service: General;  Laterality: N/A;    His Family History Is Significant For: Family History  Problem Relation Age of Onset  . Heart attack Mother   . Hypertension Mother     His Social History Is Significant For: History   Social History  . Marital Status: Married    Spouse Name: Gregary Signs    Number of Children: 4  . Years of Education: Assoc.   Occupational History  .      not employed   Social History Main Topics  . Smoking status: Never Smoker   . Smokeless tobacco: Never Used  . Alcohol Use: No  . Drug Use: Yes    Special: Marijuana     Comment: "stopped smoking marijuana in the late 1990's"  . Sexual Activity: Yes   Other Topics Concern  . None   Social History Narrative   Patient consumes caffeine occas.    His Allergies Are:  Allergies  Allergen Reactions  . Hydrocodone     Itch and crazy  :   His Current Medications Are:  Outpatient Encounter Prescriptions as of 12/30/2013  Medication Sig  . allopurinol (ZYLOPRIM) 150 mg TABS tablet Take 0.5 tablets (150 mg total) by mouth daily.  . carvedilol (COREG) 25 MG tablet Take 1 tablet (25 mg total) by mouth 2 (two) times daily with a meal.  . colchicine 0.6 MG tablet Take 1 tablet (0.6 mg total) by mouth daily.  . furosemide (LASIX) 40 MG tablet TAKE 1 1/2 TABLETS BY MOUTH TWICE DAILY  . isosorbide-hydrALAZINE (BIDIL) 20-37.5 MG per tablet Take 2 tablets by mouth 3 (three) times daily.  [name] lisinopril (PRINIVIL,ZESTRIL) 40 MG tablet Take 1 tablet (40 mg total) by mouth daily.  . Magnesium 250 MG TABS Take 250 mg by mouth daily.  [name] spironolactone (ALDACTONE) 25 MG tablet Take 25 mg by mouth daily.  . [DISCONTINUED] acetaminophen (TYLENOL) 500 MG tablet Take 1,000 mg by mouth 2 (two) times daily as needed for moderate pain.  . [DISCONTINUED] aspirin EC 325 MG tablet Take 1 tablet (325 mg total) by mouth daily.  . [DISCONTINUED] docusate sodium (COLACE) 100 MG capsule Take 1 capsule (100 mg  total) by mouth 2 (two) times daily. Continue this while taking narcotics to help with bowel movements  . [DISCONTINUED] traMADol (ULTRAM) 50 MG tablet Take 1 tablet (50 mg total) by mouth every 6 (six) hours as needed.  :  Review of Systems:  Out of a complete 14 point review of systems, all are reviewed and negative with the exception of these symptoms as listed below:   Review of Systems  HENT: Positive for hearing loss.   Skin:       moles  Neurological:       Snoring  Objective:  Neurologic Exam  Physical Exam Physical Examination:   Filed Vitals:   12/30/13 0953  BP: 126/58  Pulse: 71  Temp: 97.5 F (36.4 C)    General Examination: The patient is a very pleasant 63 y.o. [name] in no acute distress. He appears well-developed and well-nourished and well groomed.   HEENT: Normocephalic, atraumatic, pupils are equal, round and reactive to light and accommodation. Funduscopic exam is normal with sharp disc margins noted. Extraocular tracking is good without limitation to gaze excursion or nystagmus noted. Normal smooth pursuit is noted. Hearing is grossly intact. Tympanic membranes are clear bilaterally. Face is symmetric with normal facial animation and normal facial sensation. Speech is clear with no dysarthria noted. There is no hypophonia. There is no lip, neck/head, jaw or voice tremor. Neck is supple with full range of passive and active motion. There are no carotid bruits on auscultation. Oropharynx exam reveals: mild mouth dryness, adequate dental hygiene and marked airway crowding, due to thick soft palate, larger uvula and large tonsils. Mallampati is class II. Tongue protrudes centrally and palate elevates symmetrically. Tonsils are 3+ in size. Neck size is 18.5 inches. He has a Mild overbite. Nasal inspection reveals no significant nasal mucosal bogginess or redness and no septal deviation.   Chest: Clear to auscultation without wheezing, rhonchi or crackles  noted.  Heart: S1+S2+0, regular and normal without murmurs, rubs or gallops noted.   Abdomen: Soft, non-tender and non-distended with normal bowel sounds appreciated on auscultation.  Extremities: There is 1+ pitting edema in the distal lower extremities bilaterally. Pedal pulses are intact.  Skin: Warm and dry without trophic changes noted. There are no varicose veins.  Musculoskeletal: exam reveals no obvious joint deformities, tenderness or joint swelling or erythema.   Neurologically:  Mental status: The patient is awake, alert and oriented in all 4 spheres. His immediate and remote memory, attention, language skills and fund of knowledge are appropriate. There is no evidence of aphasia, agnosia, apraxia or anomia. Speech is clear with normal prosody and enunciation. Thought process is linear. Mood is normal and affect is normal.  Cranial nerves II - XII are as described above under HEENT exam. In addition: shoulder shrug is normal with equal shoulder height noted. Motor exam: Normal bulk, strength and tone is noted. There is no drift, tremor or rebound. Romberg is negative. Reflexes are 2+ throughout. Babinski: Toes are flexor bilaterally. Fine motor skills and coordination: intact with normal finger taps, normal hand movements, normal rapid alternating patting, normal foot taps and normal foot agility.  Cerebellar testing: No dysmetria or intention tremor on finger to nose testing. Heel to shin is unremarkable on the R and limited on the L. There is no truncal or gait ataxia.  Sensory exam: intact to light touch, pinprick, vibration, temperature sense in the upper and lower extremities.  Gait, station and balance: He stands with mild difficulty. No veering to one side is noted. No leaning to one side is noted. Posture is age-appropriate and stance is narrow based. Gait a mild L sided limp, and a mild "waddle". He turns well and is able to do tandem walk.               Assessment and Plan:    In summary, [name] [name] with an underlying complex medical history of hypertension, systolic heart failure, sickle cell trait, history of stroke, gout, history of brain aneurysm, osteoarthritis, status post left total hip replacement  surgery in September 2015, and obesity, who reports snoring and daytime tiredness. He has nocturia, reports morning headaches, and has a tight looking airway. He has a history and physical exam concerning for obstructive sleep apnea (OSA). I had a long chat with the patient about my findings and the diagnosis of OSA, its prognosis and treatment options. We talked about medical treatments, surgical interventions and non-pharmacological approaches. I explained in particular the risks and ramifications of untreated moderate to severe OSA, especially with respect to developing cardiovascular disease down the Road, including congestive heart failure, difficult to treat hypertension, cardiac arrhythmias, or stroke. Even type 2 diabetes has, in part, been linked to untreated OSA. Symptoms of untreated OSA include daytime sleepiness, memory problems, mood irritability and mood disorder such as depression and anxiety, lack of energy, as well as recurrent headaches, especially morning headaches. We talked about trying to maintain a healthy lifestyle in general, as well as the importance of weight control. I encouraged the patient to eat healthy, exercise daily and keep well hydrated, to keep a scheduled bedtime and wake time routine, to not skip any meals and eat healthy snacks in between meals. I advised the patient not to drive when feeling sleepy. I recommended the following at this time: sleep study with potential positive airway pressure titration. (We will score hypopneas at 4% and split the sleep study into diagnostic and treatment portion, if the estimated. 2 hour AHI is >15/h).   I explained the sleep test procedure to the patient and  also outlined possible surgical and non-surgical treatment options of OSA, including the use of a custom-made dental device (which would require a referral to a specialist dentist or oral surgeon), upper airway surgical options, such as pillar implants, radiofrequency surgery, tongue base surgery, and UPPP (which would involve a referral to an ENT surgeon). Rarely, jaw surgery such as mandibular advancement may be considered.  I also explained the CPAP treatment option to the patient, who indicated that he would be willing to try CPAP if the need arises. I explained the importance of being compliant with PAP treatment, not only for insurance purposes but primarily to improve His symptoms, and for the patient's long term health benefit, including to reduce His cardiovascular risks. I answered all his questions today and the patient was in agreement. I would like to see him back after the sleep study is completed and encouraged him to call with any interim questions, concerns, problems or updates.   Thank you very much for allowing me to participate in the care of this nice patient. If I can be of any further assistance to you please do not hesitate to call me at [phone].  Sincerely,   Star Age, MD, PhD

## 2013-12-30 NOTE — Patient Instructions (Signed)

## 2014-01-02 ENCOUNTER — Encounter (HOSPITAL_COMMUNITY): Payer: Self-pay | Admitting: Cardiology

## 2014-01-06 ENCOUNTER — Encounter (HOSPITAL_COMMUNITY): Payer: Self-pay | Admitting: Emergency Medicine

## 2014-01-06 ENCOUNTER — Emergency Department (HOSPITAL_COMMUNITY)
Admission: EM | Admit: 2014-01-06 | Discharge: 2014-01-06 | Disposition: A | Discharge status: Home / Self Care | Payer: Medicaid Other | Attending: Emergency Medicine | Admitting: Emergency Medicine

## 2014-01-06 ENCOUNTER — Emergency Department (HOSPITAL_COMMUNITY): Payer: Medicaid Other

## 2014-01-06 DIAGNOSIS — J45901 Unspecified asthma with (acute) exacerbation: Secondary | ICD-10-CM | POA: Insufficient documentation

## 2014-01-06 DIAGNOSIS — Z8673 Personal history of transient ischemic attack (TIA), and cerebral infarction without residual deficits: Secondary | ICD-10-CM | POA: Insufficient documentation

## 2014-01-06 DIAGNOSIS — M109 Gout, unspecified: Secondary | ICD-10-CM | POA: Insufficient documentation

## 2014-01-06 DIAGNOSIS — I5020 Unspecified systolic (congestive) heart failure: Secondary | ICD-10-CM | POA: Insufficient documentation

## 2014-01-06 DIAGNOSIS — I10 Essential (primary) hypertension: Secondary | ICD-10-CM | POA: Diagnosis not present

## 2014-01-06 DIAGNOSIS — Z79899 Other long term (current) drug therapy: Secondary | ICD-10-CM | POA: Diagnosis not present

## 2014-01-06 DIAGNOSIS — R609 Edema, unspecified: Secondary | ICD-10-CM | POA: Insufficient documentation

## 2014-01-06 DIAGNOSIS — Z862 Personal history of diseases of the blood and blood-forming organs and certain disorders involving the immune mechanism: Secondary | ICD-10-CM | POA: Insufficient documentation

## 2014-01-06 DIAGNOSIS — Z9889 Other specified postprocedural states: Secondary | ICD-10-CM | POA: Insufficient documentation

## 2014-01-06 DIAGNOSIS — R0602 Shortness of breath: Secondary | ICD-10-CM | POA: Diagnosis present

## 2014-01-06 DIAGNOSIS — I509 Heart failure, unspecified: Secondary | ICD-10-CM

## 2014-01-06 LAB — CBC WITH DIFFERENTIAL/PLATELET
Basophils Absolute: 0 10*3/uL (ref 0.0–0.1)
Basophils Relative: 1 % (ref 0–1)
EOS ABS: 0.4 10*3/uL (ref 0.0–0.7)
Eosinophils Relative: 6 % — ABNORMAL HIGH (ref 0–5)
HCT: 41 % (ref 39.0–52.0)
Hemoglobin: 13.6 g/dL (ref 13.0–17.0)
LYMPHS ABS: 1.3 10*3/uL (ref 0.7–4.0)
LYMPHS PCT: 21 % (ref 12–46)
MCH: 30 pg (ref 26.0–34.0)
MCHC: 33.2 g/dL (ref 30.0–36.0)
MCV: 90.3 fL (ref 78.0–100.0)
MONOS PCT: 15 % — AB (ref 3–12)
Monocytes Absolute: 1 10*3/uL (ref 0.1–1.0)
NEUTROS PCT: 57 % (ref 43–77)
Neutro Abs: 3.6 10*3/uL (ref 1.7–7.7)
Platelets: 226 10*3/uL (ref 150–400)
RBC: 4.54 MIL/uL (ref 4.22–5.81)
RDW: 13.3 % (ref 11.5–15.5)
WBC: 6.3 10*3/uL (ref 4.0–10.5)

## 2014-01-06 LAB — COMPREHENSIVE METABOLIC PANEL
ALT: 16 U/L (ref 0–53)
AST: 21 U/L (ref 0–37)
Albumin: 3.4 g/dL — ABNORMAL LOW (ref 3.5–5.2)
Alkaline Phosphatase: 71 U/L (ref 39–117)
Anion gap: 11 (ref 5–15)
BILIRUBIN TOTAL: 0.7 mg/dL (ref 0.3–1.2)
BUN: 17 mg/dL (ref 6–23)
CHLORIDE: 97 meq/L (ref 96–112)
CO2: 27 mEq/L (ref 19–32)
CREATININE: 1.27 mg/dL (ref 0.50–1.35)
Calcium: 9.3 mg/dL (ref 8.4–10.5)
GFR calc Af Amer: 69 mL/min — ABNORMAL LOW (ref >90)
GFR calc non Af Amer: 59 mL/min — ABNORMAL LOW (ref >90)
Glucose, Bld: 126 mg/dL — ABNORMAL HIGH (ref 70–99)
Potassium: 3.9 mEq/L (ref 3.7–5.3)
SODIUM: 135 meq/L — AB (ref 137–147)
Total Protein: 6.9 g/dL (ref 6.0–8.3)

## 2014-01-06 LAB — I-STAT TROPONIN, ED: TROPONIN I, POC: 0.02 ng/mL (ref 0.00–0.08)

## 2014-01-06 LAB — PRO B NATRIURETIC PEPTIDE: PRO B NATRI PEPTIDE: 1817 pg/mL — AB (ref 0–125)

## 2014-01-06 MED ORDER — FUROSEMIDE 10 MG/ML IJ SOLN
40.0000 mg | Freq: Once | INTRAMUSCULAR | Status: AC
  Administered 2014-01-06: 40 mg via INTRAVENOUS
  Filled 2014-01-06: qty 4

## 2014-01-06 MED ORDER — AZITHROMYCIN 250 MG PO TABS: ORAL_TABLET | ORAL | Status: DC

## 2014-01-06 NOTE — ED Notes (Signed)
Pt states he is been very congested, coughing and having difficulty breathing. Pt feeling worse tonight having to sleep on a recliner chair to be able to sleep. Denies CP, dizziness, fever or chills.

## 2014-01-06 NOTE — ED Notes (Signed)
Blood pressure elevated at dc time, MD aware, pt to take his regular medications at home. Pt dc per MD order, dc instructions and prescriptions reviewed with the pt. Pt and family member verbalize undertanding.

## 2014-01-06 NOTE — Discharge Instructions (Signed)
Increase your Lasix to 40 mg twice daily until followed up by Dr. Acie Fredrickson.    Zithromax as prescribed.  Return to the emergency department if your symptoms substantially worsen or change.   Heart Failure Heart failure is a condition in which the heart has trouble pumping blood. This means your heart does not pump blood efficiently for your body to work well. In some cases of heart failure, fluid may back up into your lungs or you may have swelling (edema) in your lower legs. Heart failure is usually a long-term (chronic) condition. It is important for you to take good care of yourself and follow your health care provider's treatment plan. CAUSES  Some health conditions can cause heart failure. Those health conditions include:  High blood pressure (hypertension). Hypertension causes the heart muscle to work harder than normal. When pressure in the blood vessels is high, the heart needs to pump (contract) with more force in order to circulate blood throughout the body. High blood pressure eventually causes the heart to become stiff and weak.  Coronary artery disease (CAD). CAD is the buildup of cholesterol and fat (plaque) in the arteries of the heart. The blockage in the arteries deprives the heart muscle of oxygen and blood. This can cause chest pain and may lead to a heart attack. High blood pressure can also contribute to CAD.  Heart attack (myocardial infarction). A heart attack occurs when one or more arteries in the heart become blocked. The loss of oxygen damages the muscle tissue of the heart. When this happens, part of the heart muscle dies. The injured tissue does not contract as well and weakens the heart's ability to pump blood.  Abnormal heart valves. When the heart valves do not open and close properly, it can cause heart failure. This makes the heart muscle pump harder to keep the blood flowing.  Heart muscle disease (cardiomyopathy or myocarditis). Heart muscle disease is damage to  the heart muscle from a variety of causes. These can include drug or alcohol abuse, infections, or unknown reasons. These can increase the risk of heart failure.  Lung disease. Lung disease makes the heart work harder because the lungs do not work properly. This can cause a strain on the heart, leading it to fail.  Diabetes. Diabetes increases the risk of heart failure. High blood sugar contributes to high fat (lipid) levels in the blood. Diabetes can also cause slow damage to tiny blood vessels that carry important nutrients to the heart muscle. When the heart does not get enough oxygen and food, it can cause the heart to become weak and stiff. This leads to a heart that does not contract efficiently.  Other conditions can contribute to heart failure. These include abnormal heart rhythms, thyroid problems, and low blood counts (anemia). Certain unhealthy behaviors can increase the risk of heart failure, including:  Being overweight.  Smoking or chewing tobacco.  Eating foods high in fat and cholesterol.  Abusing illicit drugs or alcohol.  Lacking physical activity. SYMPTOMS  Heart failure symptoms may vary and can be hard to detect. Symptoms may include:  Shortness of breath with activity, such as climbing stairs.  Persistent cough.  Swelling of the feet, ankles, legs, or abdomen.  Unexplained weight gain.  Difficulty breathing when lying flat (orthopnea).  Waking from sleep because of the need to sit up and get more air.  Rapid heartbeat.  Fatigue and loss of energy.  Feeling light-headed, dizzy, or close to fainting.  Loss of appetite.  Nausea.  Increased urination during the night (nocturia). DIAGNOSIS  A diagnosis of heart failure is based on your history, symptoms, physical examination, and diagnostic tests. Diagnostic tests for heart failure may include:  Echocardiography.  Electrocardiography.  Chest X-ray.  Blood tests.  Exercise stress test.  Cardiac  angiography.  Radionuclide scans. TREATMENT  Treatment is aimed at managing the symptoms of heart failure. Medicines, behavioral changes, or surgical intervention may be necessary to treat heart failure.  Medicines to help treat heart failure may include:  Angiotensin-converting enzyme (ACE) inhibitors. This type of medicine blocks the effects of a blood protein called angiotensin-converting enzyme. ACE inhibitors relax (dilate) the blood vessels and help lower blood pressure.  Angiotensin receptor blockers (ARBs). This type of medicine blocks the actions of a blood protein called angiotensin. Angiotensin receptor blockers dilate the blood vessels and help lower blood pressure.  Water pills (diuretics). Diuretics cause the kidneys to remove salt and water from the blood. The extra fluid is removed through urination. This loss of extra fluid lowers the volume of blood the heart pumps.  Beta blockers. These prevent the heart from beating too fast and improve heart muscle strength.  Digitalis. This increases the force of the heartbeat.  Healthy behavior changes include:  Obtaining and maintaining a healthy weight.  Stopping smoking or chewing tobacco.  Eating heart-healthy foods.  Limiting or avoiding alcohol.  Stopping illicit drug use.  Physical activity as directed by your health care provider.  Surgical treatment for heart failure may include:  A procedure to open blocked arteries, repair damaged heart valves, or remove damaged heart muscle tissue.  A pacemaker to improve heart muscle function and control certain abnormal heart rhythms.  An internal cardioverter defibrillator to treat certain serious abnormal heart rhythms.  A left ventricular assist device (LVAD) to assist the pumping ability of the heart. HOME CARE INSTRUCTIONS   Take medicines only as directed by your health care provider. Medicines are important in reducing the workload of your heart, slowing the  progression of heart failure, and improving your symptoms.  Do not stop taking your medicine unless directed by your health care provider.  Do not skip any dose of medicine.  Refill your prescriptions before you run out of medicine. Your medicines are needed every day.  Engage in moderate physical activity if directed by your health care provider. Moderate physical activity can benefit some people. The elderly and people with severe heart failure should consult with a health care provider for physical activity recommendations.  Eat heart-healthy foods. Food choices should be free of trans fat and low in saturated fat, cholesterol, and salt (sodium). Healthy choices include fresh or frozen fruits and vegetables, fish, lean meats, legumes, fat-free or low-fat dairy products, and whole grain or high fiber foods. Talk to a dietitian to learn more about heart-healthy foods.  Limit sodium if directed by your health care provider. Sodium restriction may reduce symptoms of heart failure in some people. Talk to a dietitian to learn more about heart-healthy seasonings.  Use healthy cooking methods. Healthy cooking methods include roasting, grilling, broiling, baking, poaching, steaming, or stir-frying. Talk to a dietitian to learn more about healthy cooking methods.  Limit fluids if directed by your health care provider. Fluid restriction may reduce symptoms of heart failure in some people.  Weigh yourself every day. Daily weights are important in the early recognition of excess fluid. You should weigh yourself every morning after you urinate and before you eat breakfast. Wear the same  amount of clothing each time you weigh yourself. Record your daily weight. Provide your health care provider with your weight record.  Monitor and record your blood pressure if directed by your health care provider.  Check your pulse if directed by your health care provider.  Lose weight if directed by your health care  provider. Weight loss may reduce symptoms of heart failure in some people.  Stop smoking or chewing tobacco. Nicotine makes your heart work harder by causing your blood vessels to constrict. Do not use nicotine gum or patches before talking to your health care provider.  Keep all follow-up visits as directed by your health care provider. This is important.  Limit alcohol intake to no more than 1 drink per day for nonpregnant women and 2 drinks per day for men. One drink equals 12 ounces of beer, 5 ounces of wine, or 1 ounces of hard liquor. Drinking more than that is harmful to your heart. Tell your health care provider if you drink alcohol several times a week. Talk with your health care provider about whether alcohol is safe for you. If your heart has already been damaged by alcohol or you have severe heart failure, drinking alcohol should be stopped completely.  Stop illicit drug use.  Stay up-to-date with immunizations. It is especially important to prevent respiratory infections through current pneumococcal and influenza immunizations.  Manage other health conditions such as hypertension, diabetes, thyroid disease, or abnormal heart rhythms as directed by your health care provider.  Learn to manage stress.  Plan rest periods when fatigued.  Learn strategies to manage high temperatures. If the weather is extremely hot:  Avoid vigorous physical activity.  Use air conditioning or fans or seek a cooler location.  Avoid caffeine and alcohol.  Wear loose-fitting, lightweight, and light-colored clothing.  Learn strategies to manage cold temperatures. If the weather is extremely cold:  Avoid vigorous physical activity.  Layer clothes.  Wear mittens or gloves, a hat, and a scarf when going outside.  Avoid alcohol.  Obtain ongoing education and support as needed.  Participate in or seek rehabilitation as needed to maintain or improve independence and quality of life. SEEK MEDICAL  CARE IF:   Your weight increases by 03 lb/1.4 kg in 1 day or 05 lb/2.3 kg in a week.  You have increasing shortness of breath that is unusual for you.  You are unable to participate in your usual physical activities.  You tire easily.  You cough more than normal, especially with physical activity.  You have any or more swelling in areas such as your hands, feet, ankles, or abdomen.  You are unable to sleep because it is hard to breathe.  You feel like your heart is beating fast (palpitations).  You become dizzy or light-headed upon standing up. SEEK IMMEDIATE MEDICAL CARE IF:   You have difficulty breathing.  There is a change in mental status such as decreased alertness or difficulty with concentration.  You have a pain or discomfort in your chest.  You have an episode of fainting (syncope). MAKE SURE YOU:   Understand these instructions.  Will watch your condition.  Will get help right away if you are not doing well or get worse. Document Released: 01/10/2005 Document Revised: 05/27/2013 Document Reviewed: 02/10/2012 [facility] Patient Information 2015 [place], [place]. This information is not intended to replace advice given to you by your health care provider. Make sure you discuss any questions you have with your health care provider.

## 2014-01-06 NOTE — ED Provider Notes (Addendum)
CSN: [id number]     Arrival date & time 01/06/14  0504 History   First [name] Initiated Contact with [name] [name] 01/06/14 0524     Chief Complaint  [name] [name] presents with  . Shortness of Breath     (Consider location/radiation/quality/duration/timing/severity/associated sxs/prior Treatment) HPI Comments: [name] [name] is a 63 year old male with history of congestive heart failure. He presents today with complaints of a 2 day history of worsening dyspnea on exertion and orthopnea. He denies any fevers or chills. He denies any productive cough.  His cardiologist is [name] [name].  His most recent echocardiogram was from June 2015 and reveals global hypokinesis with an EF of 25-30%.  [name] [name] is a 63 y.o. male presenting with shortness of breath. The history is provided by the [name] [name].  Shortness of Breath Severity:  Moderate Onset quality:  Gradual Duration:  2 days Timing:  Constant Progression:  Worsening Chronicity:  Recurrent Context: activity   Relieved by:  Nothing Worsened by:  Activity and movement (Lying flat) Ineffective treatments:  None tried Associated symptoms: no chest pain, no fever and no sputum production     Past Medical History  Diagnosis Date  . Hypertension   . Systolic heart failure     EF is 35 to 40%  . Asthma     'as a child"   . Shortness of breath     "comes on at anytime" (01/18/2013)  . Sickle cell trait   . Stroke 12/1988  . CHF (congestive heart failure)   . Blood clotting disorder     pt. states his family has a history of "slow clotting", had bleeding after oral surgery age 29 but none with extractions 2015 or adult surgeries; thought father (now deceased) had a hx of hemophilia  . History of gout   . Daily headache     are due to the medication he is taking  . Brain aneurysm 01/11/1989  . Arthritis     "left hip; right knee" (10/09/2013)  . Full dentures   . Wears glasses    Past Surgical History  Procedure Laterality Date  . Nm pet  lymphoma  initial  2004?    "benign"  . Cardiac catheterization  12/1988; 12/2012  . Multiple tooth extractions  spring 2015    "took out 7"  . Total hip arthroplasty Left 10/08/2013    anterior approach  . Hernia repair  2004?    "above the navel"   . Gum surgery  1966  . Total hip arthroplasty Left 10/08/2013    Procedure: TOTAL HIP ARTHROPLASTY ANTERIOR APPROACH;  Surgeon: Renette Butters, [name];  Location: [facility];  Service: Orthopedics;  Laterality: Left;  [name] Muscle biopsy N/A 12/06/2013    Procedure: FAT PAD BIOPSY;  Surgeon: Erroll Luna, [name];  Location: [place];  Service: General;  Laterality: N/A;  . Left and right heart catheterization with coronary angiogram N/A 01/21/2013    Procedure: LEFT AND RIGHT HEART CATHETERIZATION WITH CORONARY ANGIOGRAM;  Surgeon: Minus Breeding, [name];  Location: [facility] CATH LAB;  Service: Cardiovascular;  Laterality: N/A;   Family History  Problem Relation Age of Onset  . Heart attack Mother   . Hypertension Mother    History  Substance Use Topics  . Smoking status: Never Smoker   . Smokeless tobacco: Never Used  . Alcohol Use: No    Review of Systems  Constitutional: Negative for fever.  Respiratory: Positive for shortness of breath. Negative for sputum production.   Cardiovascular: Negative for chest  pain.  All other systems reviewed and are negative.     Allergies  Hydrocodone  Home Medications   Prior to Admission medications   Medication Sig Start Date End Date Taking? Authorizing Provider  allopurinol (ZYLOPRIM) 150 mg TABS tablet Take 0.5 tablets (150 mg total) by mouth daily. 12/18/13   [name] [name]  carvedilol (COREG) 25 MG tablet Take 1 tablet (25 mg total) by mouth 2 (two) times daily with a meal. 08/12/13   [name] [name]  colchicine 0.6 MG tablet Take 1 tablet (0.6 mg total) by mouth daily. 12/13/13   [name] [name]  furosemide (LASIX) 40 MG tablet TAKE 1 1/2 TABLETS BY MOUTH TWICE DAILY 11/07/13   [name] [name]  isosorbide-hydrALAZINE (BIDIL) 20-37.5 MG per tablet Take 2 tablets by mouth 3 (three) times daily. 05/08/13   [name] [name]  lisinopril (PRINIVIL,ZESTRIL) 40 MG tablet Take 1 tablet (40 mg total) by mouth daily. 05/11/13   [name] [name]  Magnesium 250 MG TABS Take 250 mg by mouth daily.    Historical Provider, [name]  spironolactone (ALDACTONE) 25 MG tablet Take 25 mg by mouth daily. 09/02/13   [name] [name]   BP 160/109 mmHg  Pulse 80  Temp(Src) 97.6 F (36.4 C) (Oral)  Resp 20  Ht 6\' 4"  (1.93 m)  Wt 268 lb (121.564 kg)  BMI 32.64 kg/m2  SpO2 95% Physical Exam  Constitutional: He is oriented to person, place, and time. He appears well-developed and well-nourished. No distress.  HENT:  Head: Normocephalic and atraumatic.  Mouth/Throat: Oropharynx is clear and moist.  Neck: Normal range of motion. Neck supple.  Cardiovascular: Normal rate, regular rhythm and normal heart sounds.   No murmur heard. Pulmonary/Chest: Effort normal. No respiratory distress. He has no wheezes. He has rales.  There are slight rales in the bases bilaterally.  Abdominal: Soft. Bowel sounds are normal. He exhibits no distension. There is no tenderness.  Musculoskeletal: Normal range of motion. He exhibits edema.  There is trace bilateral lower extremity edema.  Lymphadenopathy:    He has no cervical adenopathy.  Neurological: He is alert and oriented to person, place, and time.  Skin: Skin is warm and dry. He is not diaphoretic.  Nursing note and vitals reviewed.   ED Course  Procedures (including critical care time) Labs Review Labs Reviewed  COMPREHENSIVE METABOLIC PANEL  CBC WITH DIFFERENTIAL  PRO B NATRIURETIC PEPTIDE  I-STAT [place], ED    Imaging Review No results found.   EKG Interpretation   Date/Time:  Monday January 06 2014 05:25:37 EST Ventricular Rate:  75 PR Interval:  160 QRS Duration: 116 QT Interval:  404 QTC Calculation:  451 R Axis:   -52 Text Interpretation:  Sinus rhythm Probable left atrial enlargement LAD,  consider left anterior fascicular block LVH with secondary repolarization  abnormality Confirmed by DELOS  [name], [name] [name] ([id number]) on 01/06/2014 5:35:29  AM      MDM   Final diagnoses:  None    [name] [name] is a 63 year old male with history of CHF. He presents with complaints of difficulty breathing for the past 3 days. He is also had a productive cough. Today's workup reveals an elevated BNP of 1800 which is above his baseline. Chest x-ray reveals cardiomegaly but no overt evidence of pulmonary edema. He was given IV Lasix with good diuresis. His EKG is unchanged and troponin is negative. I feel as though he  is appropriate for discharge. I will increase his Lasix to 40 mg twice daily. He has a follow-up appointment with his cardiologist on the 17th and I feel as though this is an appropriate timeframe for follow-up. He is to return if his symptoms substantially worsen or change. He is not hypoxic and not tachycardic and appears stable. I will also prescribe Zithromax as he reports some productive cough.    Veryl Speak, [name] 01/06/14 [place], [name] 01/06/14 [phone]

## 2014-01-09 ENCOUNTER — Ambulatory Visit (INDEPENDENT_AMBULATORY_CARE_PROVIDER_SITE_OTHER): Payer: Medicaid Other | Admitting: Cardiovascular Disease

## 2014-01-09 ENCOUNTER — Encounter: Payer: Self-pay | Admitting: Cardiovascular Disease

## 2014-01-09 VITALS — BP 100/64 | HR 74 | Ht 76.0 in | Wt 267.4 lb

## 2014-01-09 DIAGNOSIS — I5022 Chronic systolic (congestive) heart failure: Secondary | ICD-10-CM

## 2014-01-09 NOTE — Patient Instructions (Signed)
Your physician recommends that you continue on your current medications as directed. Please refer to the Current Medication list given to you today.   Your physician has requested that you have an echocardiogram. Echocardiography is a painless test that uses sound waves to create images of your heart. It provides your doctor with information about the size and shape of your heart and how well your heart's chambers and valves are working. This procedure takes approximately one hour. There are no restrictions for this procedure.  Your physician recommends that you schedule a follow-up appointment in: 3 months with Dr. Acie Fredrickson.

## 2014-01-09 NOTE — Progress Notes (Signed)
Message left on home phone recording [facility] 01/13/14 9 and 10AM. No restrictions. Avik Leoni rn 01/09/14 3:15pm

## 2014-01-09 NOTE — Progress Notes (Signed)
[name] [name] Date of Birth: 03/01/1950 Medical Record #[id number]  Problem List: 1. Hypertension 2.  Chronic systolic Congestive heart failure - EF 35-44% 3. History of stroke 4. Gout  History of Present Illness: [name] [name] is seen back today for a post hospital/TOC visit.  [name] He has had longstanding HTN, asthma, and prior CVA. Originally from the [place].   Most recently presented with increased SOB, DOE and orthopnea for 3 weeks - he was found to be hypertensive, using NSAID, etc. Had been taking some Lasix that he was prescribed while living in [place]. He was cathed - had mild CAD with elevated right sided pressures. Bidil added along with ACE. Renal function improved. EF was 35 to 40%.   Comes back today. Here alone. Medicines are all mixed up - on both OTC potassium and prescription and only taking the OTC agent. He is not taking Mobic (which was left on his discharge meds). Has not had his medicines today. His car had to be towed and so he did not eat and thus did not take his medicines. He notes that his breathing is much better. Weight is stable at home. His swelling has improved but not totally resolved. Some ankle pain. Has chronic hip pain and wanted to have THR in the future. No chest pain. Avoiding salt. Does not drink alcohol. Trying to get on Medicaid. Works part time in [place] and needs to return to work so he can buy his medicines. Some headache and he thought it was due to his medicine. He does not have a primary care doctor.   Feb. 12, 2015:  He is seen back for a visit.  His BP has remained elevated.  He was readmitted to the hospital with shortness of breath. He was aggressively diuresed but then developed gout. He had documented uric acid crystals seen on a joint aspiration.  He is not able to avoid the colchicine.  His blood pressure remains elevated.   Avoids salt - canned food, bacon, sausage.  May 08, 2013;  His BP has been ok at home.  He is avoiding  salt.  He had a severe gouty flare up .  He was prescribed colchicine but could not afford it.   He is hoping that he can afford it now that he is on Medicaid.   Also has lots of hip pain.  Has seen a chiropractor.   He likely needs to have a hip replacement.   August 05, 2013: [name] [name] is doing OK.  He saw [name] .  He's been having some nausea for the past 3-4 weeks. He complains of having headaches with the BIDil  - resolves with tylenol.  His edema has resolved   Dec. 17, 2015:  [name] [name] is a 63 yo with hx of CVA, chronic systolic CHF ,  HTN.   He went to the ER on Sunday / Monday . Was having orthopnea. He received IV lasix and urinated quite a bit.  Now is breathing better.   He is having some orthostatic hypotension. He feels dizzy and his BP is typically very low during these times.  He has adjusted his medication schedule such that he will hold his meds if he needs to go out to appointments.    Current Outpatient Prescriptions  Medication Sig Dispense Refill  . allopurinol (ZYLOPRIM) 150 mg TABS tablet Take 0.5 tablets (150 mg total) by mouth daily. 30 tablet 0  . azithromycin (ZITHROMAX Z-PAK) 250 MG tablet 2 po  day one, then 1 daily x 4 days 6 tablet 0  . carvedilol (COREG) 25 MG tablet Take 1 tablet (25 mg total) by mouth 2 (two) times daily with a meal. 60 tablet 6  . colchicine 0.6 MG tablet Take 1 tablet (0.6 mg total) by mouth daily. 30 tablet 1  . furosemide (LASIX) 40 MG tablet TAKE 1 1/2 TABLETS BY MOUTH TWICE DAILY 90 tablet 11  . isosorbide-hydrALAZINE (BIDIL) 20-37.5 MG per tablet Take 2 tablets by mouth 3 (three) times daily. 550 tablet 3  . lisinopril (PRINIVIL,ZESTRIL) 40 MG tablet Take 1 tablet (40 mg total) by mouth daily. 30 tablet 10  . Magnesium 250 MG TABS Take 250 mg by mouth daily.    [name] spironolactone (ALDACTONE) 25 MG tablet Take 25 mg by mouth daily.     No current facility-administered medications for this visit.    Allergies  Allergen  Reactions  . Hydrocodone     Itch and crazy    Past Medical History  Diagnosis Date  . Hypertension   . Systolic heart failure     EF is 35 to 40%  . Asthma     'as a child"   . Shortness of breath     "comes on at anytime" (01/18/2013)  . Sickle cell trait   . Stroke 12/1988  . CHF (congestive heart failure)   . Blood clotting disorder     pt. states his family has a history of "slow clotting", had bleeding after oral surgery age 34 but none with extractions 2015 or adult surgeries; thought father (now deceased) had a hx of hemophilia  . History of gout   . Daily headache     are due to the medication he is taking  . Brain aneurysm 01/11/1989  . Arthritis     "left hip; right knee" (10/09/2013)  . Full dentures   . Wears glasses     Past Surgical History  Procedure Laterality Date  . 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 Cardiac catheterization  12/1988; 12/2012  . Multiple tooth extractions  spring 2015    "took out 7"  . Total hip arthroplasty Left 10/08/2013    anterior approach  . Hernia repair  2004?    "above the navel"   . Gum surgery  1966  . Total hip arthroplasty Left 10/08/2013    Procedure: TOTAL HIP ARTHROPLASTY ANTERIOR APPROACH;  Surgeon: Renette Butters, MD;  Location: [facility];  Service: Orthopedics;  Laterality: Left;  [name] Muscle biopsy N/A 12/06/2013    Procedure: FAT PAD BIOPSY;  Surgeon: Erroll Luna, MD;  Location: [facility];  Service: General;  Laterality: N/A;  . Left and right heart catheterization with coronary angiogram N/A 01/21/2013    Procedure: LEFT AND RIGHT HEART CATHETERIZATION WITH CORONARY ANGIOGRAM;  Surgeon: Minus Breeding, MD;  Location[facility] C[facility] LAB;  Service: Cardiovascular;  Laterality: N/A;    History  Smoking status  . Never Smoker   Smokeless tobacco  . Never Used    History  Alcohol Use No    Family History  Problem Relation Age of Onset  . Heart attack Mother   . Hypertension Mother      Review of Systems: The review of systems is per the HPI.  All other systems were reviewed and are negative.  Physical Exam: BP 100/64 mmHg  Pulse 74  Ht 6\' 4"  (1.93 m)  Wt 267 lb 6.4 oz (121.292  kg)  BMI 32.56 kg/m2 He has had no medicines today.  Patient is very pleasant and in no acute distress. He is of large stature. Skin is warm and dry. Color is normal.  HEENT is unremarkable. Normocephalic/atraumatic. PERRL. Sclera are nonicteric. Neck is supple. No masses. No JVD. Lungs are clear. Cardiac exam shows a regular rate and rhythm. Rate a little faster. +S4.  Abdomen is soft. Extremities are with 1+ edema. Gait and ROM are intact. No gross neurologic deficits noted.  Wt Readings from Last 3 Encounters:  01/09/14 267 lb 6.4 oz (121.292 kg)  01/06/14 268 lb (121.564 kg)  12/30/13 270 lb (122.471 kg)     LABORATORY DATA: BMET and BNP are pending  Lab Results  Component Value Date   WBC 6.3 01/06/2014   HGB 13.6 01/06/2014   HCT 41.0 01/06/2014   PLT 226 01/06/2014   GLUCOSE 126* 01/06/2014   CHOL 184 05/22/2013   TRIG 87 05/22/2013   HDL 42 05/22/2013   LDLCALC 125* 05/22/2013   ALT 16 01/06/2014   AST 21 01/06/2014   NA 135* 01/06/2014   K 3.9 01/06/2014   CL 97 01/06/2014   CREATININE 1.27 01/06/2014   BUN 17 01/06/2014   CO2 27 01/06/2014   TSH 1.124 01/18/2013   PSA 2.49 05/22/2013   INR 1.09 09/27/2013   HGBA1C 5.3 05/22/2013   Echo Study Conclusions from December 2014  - Left ventricle: The cavity size was moderately dilated. Wall thickness was increased in a pattern of moderate LVH. Systolic function was moderately reduced. The estimated ejection fraction was in the range of 35% to 40%. Diffuse hypokinesis. Regional wall motion abnormalities cannot be excluded. The study is not technically sufficient to allow evaluation of LV diastolic function. - Aortic valve: Mild regurgitation. - Mitral valve: Mild regurgitation. - Left atrium: The atrium was  mildly dilated. - Right ventricle: The cavity size was mildly dilated. - Right atrium: The atrium was mildly dilated. - Tricuspid valve: Moderate regurgitation. - Pulmonic valve: Moderate regurgitation. - Pulmonary arteries: PA peak pressure: 27mm Hg (S).  Coronary angiography:  Coronary dominance: right  Left mainstem: Long and normal.  Left anterior descending (LAD): Normal. D1 large and normal.  Left circumflex (LCx): AV groove normal. OM large and normal.  Right coronary artery (RCA): Very large. Mild diffuse luminal irregularities. Long mid 40% stenosis. AM very large with PDA. Mild diffuse luminal irregularities. PL large nd normal  Left ventriculography: Left ventricle was crossed for pressures but I did not do an LV gram secondary to elevated end diastolic pressure and CKD  Final Conclusions: Mild coronary plaque. Elevated right sided pressures.      Assessment / Plan:

## 2014-01-09 NOTE — Assessment & Plan Note (Addendum)
Continue to titrate meds. His fat pad bx revealed no evicence of amyloidosis.  He is having symptoms or orthstatic hypotension so it will be difficult to increase meds at this point  He is feeling better.

## 2014-01-10 ENCOUNTER — Ambulatory Visit: Payer: Medicaid Other | Admitting: Cardiovascular Disease

## 2014-01-13 ENCOUNTER — Ambulatory Visit (HOSPITAL_COMMUNITY)
Admission: RE | Admit: 2014-01-13 | Discharge: 2014-01-13 | Disposition: A | Discharge status: Home / Self Care | Payer: Medicaid Other | Source: Ambulatory Visit | Attending: Internal Medicine | Admitting: Internal Medicine

## 2014-01-13 DIAGNOSIS — I6523 Occlusion and stenosis of bilateral carotid arteries: Secondary | ICD-10-CM | POA: Insufficient documentation

## 2014-01-13 DIAGNOSIS — IMO0001 Reserved for inherently not codable concepts without codable children: Secondary | ICD-10-CM

## 2014-01-13 DIAGNOSIS — R0989 Other specified symptoms and signs involving the circulatory and respiratory systems: Secondary | ICD-10-CM

## 2014-01-13 NOTE — Progress Notes (Signed)
VASCULAR LAB PRELIMINARY  PRELIMINARY  PRELIMINARY  PRELIMINARY  Carotid duplex and upper extremity Doppler completed.    Preliminary report:  Bilateral:  1-39% ICA stenosis.  Right:  Vertebral artery demonstrates bidirectional flow (bunny sign).  Left: Vertebral artery flow is antegrade.  Vertebral waveform and difference in upper extremity pressures from side to side suggest early stages of subclavian steal on the right.    RIGHT   LEFT    PRESSURE WAVEFORM  PRESSURE WAVEFORM  SUBCLAVIAN NA Triphasic  SUBCLAVIAN NA Triphasic   AXILLARY NA Triphasic AXILLARY NA Triphasic  BRACHIAL 100 Triphasic BRACHIAL 123 Triphasic   RADIAL 102 Triphasic  RADIAL 121 Triphasic   ULNAR 105 Triphasic  ULNAR 112 Triphasic      Rylen Hou, RVT 01/13/2014, 11:38 AM

## 2014-01-13 NOTE — Addendum Note (Signed)
Addended by: Orson Gear on: 01/13/2014 02:01 PM   Modules accepted: Orders

## 2014-01-14 ENCOUNTER — Other Ambulatory Visit (HOSPITAL_COMMUNITY): Payer: Medicaid Other

## 2014-01-14 ENCOUNTER — Ambulatory Visit (HOSPITAL_COMMUNITY): Payer: Medicaid Other | Attending: Cardiology

## 2014-01-14 DIAGNOSIS — I5022 Chronic systolic (congestive) heart failure: Secondary | ICD-10-CM

## 2014-01-14 DIAGNOSIS — N182 Chronic kidney disease, stage 2 (mild): Secondary | ICD-10-CM | POA: Insufficient documentation

## 2014-01-14 DIAGNOSIS — I10 Essential (primary) hypertension: Secondary | ICD-10-CM | POA: Diagnosis not present

## 2014-01-14 DIAGNOSIS — I509 Heart failure, unspecified: Secondary | ICD-10-CM

## 2014-01-14 NOTE — Progress Notes (Signed)
2D Echo completed. 01/14/2014 

## 2014-01-15 ENCOUNTER — Telehealth: Payer: Self-pay | Admitting: *Deleted

## 2014-01-15 NOTE — Telephone Encounter (Signed)
Pt notified about echo results with verbal understanding from pt. He asked what was the previous EF. I had stated 6/15 EF 25-30%, now is 30-35%. Pt said thank you.

## 2014-02-05 ENCOUNTER — Ambulatory Visit (INDEPENDENT_AMBULATORY_CARE_PROVIDER_SITE_OTHER): Payer: Medicaid Other | Admitting: Neurology

## 2014-02-05 VITALS — BP 109/64 | HR 62

## 2014-02-05 DIAGNOSIS — G479 Sleep disorder, unspecified: Secondary | ICD-10-CM

## 2014-02-05 DIAGNOSIS — G4733 Obstructive sleep apnea (adult) (pediatric): Secondary | ICD-10-CM

## 2014-02-05 DIAGNOSIS — R9431 Abnormal electrocardiogram [ECG] [EKG]: Secondary | ICD-10-CM

## 2014-02-05 DIAGNOSIS — G4761 Periodic limb movement disorder: Secondary | ICD-10-CM

## 2014-02-06 ENCOUNTER — Other Ambulatory Visit: Payer: Self-pay | Admitting: Internal Medicine

## 2014-02-06 DIAGNOSIS — G458 Other transient cerebral ischemic attacks and related syndromes: Secondary | ICD-10-CM

## 2014-02-06 NOTE — Sleep Study (Signed)
Please see the scanned sleep study interpretation located in the Procedure tab within the chart review section

## 2014-02-12 ENCOUNTER — Encounter: Payer: Self-pay | Admitting: Neurology

## 2014-02-12 ENCOUNTER — Telehealth: Payer: Self-pay | Admitting: Neurology

## 2014-02-12 ENCOUNTER — Telehealth: Payer: Self-pay | Admitting: *Deleted

## 2014-02-12 DIAGNOSIS — G4733 Obstructive sleep apnea (adult) (pediatric): Secondary | ICD-10-CM

## 2014-02-12 MED ORDER — ISOSORBIDE DINITRATE 20 MG PO TABS: 20.0000 mg | ORAL_TABLET | Freq: Three times a day (TID) | ORAL | Status: DC

## 2014-02-12 MED ORDER — HYDRALAZINE HCL 50 MG PO TABS: 50.0000 mg | ORAL_TABLET | Freq: Three times a day (TID) | ORAL | Status: DC

## 2014-02-12 NOTE — Telephone Encounter (Signed)
Please call and notify the patient that the recent sleep study did confirm the diagnosis of moderate obstructive sleep apnea and that I recommend treatment for this in the form of CPAP. This will require a repeat sleep study for proper titration and mask fitting. Please explain to patient and arrange for a CPAP titration study.  I have placed an order in the chart. Thanks,  The primary reason why he did not have a split-night sleep study on 02/05/2014 was poor sleep efficiency with a very long sleep latency of over 3 hours.  Star Age, MD, PhD [facility])

## 2014-02-12 NOTE — Telephone Encounter (Signed)
Agree with substitutinng Isordil and hydralazine for Bidil

## 2014-02-12 NOTE — Telephone Encounter (Signed)
I have filled Rx for Isordil and Hydralazine which are the separate agents of Bidil

## 2014-02-12 NOTE — Telephone Encounter (Signed)
Patient stated that the bidil is not available at this time. Please advise on an appropriate alternative. Thanks, MI

## 2014-02-19 NOTE — Telephone Encounter (Signed)
Patient was contacted and provided the results of his sleep study which did reveal the presence of obstructive sleep apnea.  Patient was informed that a CPAP titration study had been recommended as treatment.  The patient was in agreement and scheduled his CPAP titration study for Sunday Feb. 14th at 09:30pm.  Dr. Duwaine Maxin was faxed a copy of the report.  The patient gave verbal permission to mail a copy of his test results.

## 2014-02-21 ENCOUNTER — Telehealth: Payer: Self-pay | Admitting: *Deleted

## 2014-02-21 NOTE — Telephone Encounter (Signed)
Dr Fuller Plan, Reviewing chart in [place]. Pt has procedure with you as a direct colon for 2-16 Tuesday with his PV Tuesday 2-2.  Notes state he had a previous colon 15+ years ago but not in epic that I can find. He saw cardiology 01-09-2014 and an echo was done 01-14-2014 with an EF of 30-35%. He had a previous echo that showed 35-40 %. He also has CKD stage II, hx of cva, HTN, hx brain aneurysm 1990, sickle cell trait.  Do you want an OV prior to his colon or can he be direct?  Thanks for your time, Marijean Niemann.

## 2014-02-23 NOTE — Telephone Encounter (Signed)
Office visit with APP

## 2014-02-24 NOTE — Telephone Encounter (Signed)
Patient called and made aware of need to change previsit appointment to office appointment with extender. Office visit scheduled with Arta Bruce, PA 02/25/14 0815 am. Pt in agreement.

## 2014-02-25 ENCOUNTER — Encounter: Payer: Medicaid Other | Admitting: Gastroenterology

## 2014-02-25 ENCOUNTER — Ambulatory Visit (INDEPENDENT_AMBULATORY_CARE_PROVIDER_SITE_OTHER): Payer: Medicaid Other | Admitting: Physician Assistant

## 2014-02-25 ENCOUNTER — Encounter: Payer: Self-pay | Admitting: Physician Assistant

## 2014-02-25 VITALS — BP 106/66 | HR 72 | Ht 74.5 in | Wt 268.0 lb

## 2014-02-25 DIAGNOSIS — Z1211 Encounter for screening for malignant neoplasm of colon: Secondary | ICD-10-CM

## 2014-02-25 NOTE — Patient Instructions (Signed)
You should be contacted by [facility] within the next several days regarding Cologuard testing.  CC:Dr Redmond Pulling

## 2014-02-25 NOTE — Progress Notes (Signed)
I agree with the above note, plan. Cologuard screening and if positive will recommend colonsocopy

## 2014-02-25 NOTE — Progress Notes (Signed)
Patient ID: [name] [name], male   DOB: 1951/01/17, 64 y.o.   MRN: [id number]    HPI:    [name] [name] is a 64 year old male referred for evaluation by Dr. Redmond Pulling due for colorectal cancer screening.  [name] [name] has not had any change in his bowel habits or stool caliber. He has had no bloody or tarry stools, but he states a few months ago his stools were dark for 1 day, but not black. He has had no anorexia or unexplained weight loss. There is no known family history of colon cancer, colon polyps, or inflammatory bowel disease. He did have a colonoscopy 10-12 years ago in the [place] and no polyps were noted. He was recently evaluated by his primary care provider and advised that he is in need of colorectal cancer screening. He has an underlying history of hypertension, chronic systolic congestive heart failure with an EF of 35-44%, history of stroke, chronic kidney disease, gout, eczema, osteoarthritis of his hips, degenerative joint disease, and hyperglycemia.   Past Medical History  Diagnosis Date  . Hypertension   . Systolic heart failure     EF is 35 to 40%  . Asthma     'as a child"   . Shortness of breath     "comes on at anytime" (01/18/2013)  . Sickle cell trait   . Stroke 12/1988  . CHF (congestive heart failure)   . Blood clotting disorder     pt. states his family has a history of "slow clotting", had bleeding after oral surgery age 36 but none with extractions 2015 or adult surgeries; thought father (now deceased) had a hx of hemophilia  . History of gout   . Daily headache     are due to the medication he is taking  . Brain aneurysm 01/11/1989  . Arthritis     "left hip; right knee" (10/09/2013)  . Full dentures   . Wears glasses     Past Surgical History  Procedure Laterality Date  . 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 Cardiac catheterization  12/1988; 12/2012  . Multiple tooth extractions  spring 2015    "took out 7"  . Total hip arthroplasty Left  10/08/2013    anterior approach  . Hernia repair  2004?    "above the navel"   . Gum surgery  1966  . Total hip arthroplasty Left 10/08/2013    Procedure: TOTAL HIP ARTHROPLASTY ANTERIOR APPROACH;  Surgeon: Renette Butters, MD;  Location: [facility];  Service: Orthopedics;  Laterality: Left;  [name] Muscle biopsy N/A 12/06/2013    Procedure: FAT PAD BIOPSY;  Surgeon: Erroll Luna, MD;  Location: [facility];  Service: General;  Laterality: N/A;  . Left and right heart catheterization with coronary angiogram N/A 01/21/2013    Procedure: LEFT AND RIGHT HEART CATHETERIZATION WITH CORONARY ANGIOGRAM;  Surgeon: Minus Breeding, MD;  Location: [facility] CATH LAB;  Service: Cardiovascular;  Laterality: N/A;   Family History  Problem Relation Age of Onset  . Heart attack Mother   . Hypertension Mother    History  Substance Use Topics  . Smoking status: Never Smoker   . Smokeless tobacco: Never Used  . Alcohol Use: No   Current Outpatient Prescriptions  Medication Sig Dispense Refill  . allopurinol (ZYLOPRIM) 150 mg TABS tablet Take 0.5 tablets (150 mg total) by mouth daily. 30 tablet 0  . carvedilol (COREG) 25 MG tablet Take 1 tablet (25 mg  total) by mouth 2 (two) times daily with a meal. 60 tablet 6  . furosemide (LASIX) 40 MG tablet TAKE 1 1/2 TABLETS BY MOUTH TWICE DAILY 90 tablet 11  . hydrALAZINE (APRESOLINE) 50 MG tablet Take 1 tablet (50 mg total) by mouth 3 (three) times daily. 270 tablet 3  . isosorbide dinitrate (ISORDIL) 20 MG tablet Take 1 tablet (20 mg total) by mouth 3 (three) times daily. 270 tablet 3  . lisinopril (PRINIVIL,ZESTRIL) 40 MG tablet Take 1 tablet (40 mg total) by mouth daily. 30 tablet 10  . Magnesium 250 MG TABS Take 250 mg by mouth daily.    [name] spironolactone (ALDACTONE) 25 MG tablet Take 25 mg by mouth daily.     No current facility-administered medications for this visit.   Allergies  Allergen Reactions  . Hydrocodone     Itch and crazy     Review of  Systems: Gen: Denies any fever, chills, sweats, anorexia, fatigue, weakness, malaise, weight loss, and sleep disorder CV: Denies chest pain, angina, palpitations, syncope, orthopnea, PND, peripheral edema, and claudication. Resp: Denies dyspnea at rest, dyspnea with exercise, cough, sputum, wheezing, coughing up blood, and pleurisy. GI: Denies vomiting blood, jaundice, and fecal incontinence.   Denies dysphagia or odynophagia. GU : Denies urinary burning, blood in urine, urinary frequency, urinary hesitancy, nocturnal urination, and urinary incontinence. MS: Denies joint pain, limitation of movement, and swelling, stiffness, low back pain, extremity pain. Denies muscle weakness, cramps, atrophy.  Derm: Denies rash, itching, dry skin, hives, moles, warts, or unhealing ulcers.  Psych: Denies depression, anxiety, memory loss, suicidal ideation, hallucinations, paranoia, and confusion. Heme: Denies bruising, bleeding, and enlarged lymph nodes. Neuro:  Denies any headaches, dizziness, paresthesias. Endo:  Denies any problems with DM, thyroid, adrenal function   Prior Endoscopies:   See history of present illness.  Physical Exam: BP 106/66 mmHg  Pulse 72  Ht 6' 2.5" (1.892 m)  Wt 268 lb (121.564 kg)  BMI 33.96 kg/m2 Constitutional: Pleasant,well-developed male in no acute distress. HEENT: Normocephalic and atraumatic. Conjunctivae are normal. No scleral icterus. Neck supple.no thyromegaly  Cardiovascular: Normal rate, regular rhythm.  Pulmonary/chest: Effort normal and breath sounds normal. No wheezing, rales or rhonchi. Abdominal: Soft, nondistended, nontender. Bowel sounds active throughout. There are no masses palpable. No hepatomegaly. Rectal: light brown stool, heme negative Extremities: no edema Lymphadenopathy: No cervical adenopathy noted. Neurological: Alert and oriented to person place and time. Skin: Skin is warm and dry. No rashes noted. Psychiatric: Normal mood and affect.  Behavior is normal.  ASSESSMENT AND PLAN: Asymptomatic 64 year old male with no family history of colon cancer referred for colorectal cancer screening. We have spent a considerable amount of time reviewing his options of conventional colonoscopy, virtual colonoscopy and coldoguard. Patient opts to proceed with colo guard at this time, with the understanding that if his colo guard testing is positive he may need to follow with a conventional colonoscopy.    Miara Emminger, Vita Barley PA-C 02/25/2014, 12:42 PM

## 2014-02-28 ENCOUNTER — Telehealth: Payer: Self-pay | Admitting: Gastroenterology

## 2014-02-28 NOTE — Telephone Encounter (Signed)
Patient wants to know if his insurance will cover cologuard. [name]  He is advised that Cologuard will be who he needs to talk to that this is not something we can tell him.

## 2014-03-05 ENCOUNTER — Telehealth: Payer: Self-pay | Admitting: Physician Assistant

## 2014-03-07 NOTE — Telephone Encounter (Signed)
I have spoken to the cologuard representative who tells me that this test is not covered under Medicaid. However, he should call Colog[employer] billing to see if they can give him any assistance in proceeding with the test. I have advised patient of this. He will call us back if he is unable to go forward with the test.

## 2014-03-11 ENCOUNTER — Encounter: Payer: Medicaid Other | Admitting: Gastroenterology

## 2014-03-12 ENCOUNTER — Other Ambulatory Visit: Payer: Self-pay | Admitting: Cardiology

## 2014-03-13 ENCOUNTER — Encounter: Payer: Self-pay | Admitting: Vascular Surgery

## 2014-03-14 ENCOUNTER — Ambulatory Visit (INDEPENDENT_AMBULATORY_CARE_PROVIDER_SITE_OTHER): Payer: Medicaid Other | Admitting: Vascular Surgery

## 2014-03-14 ENCOUNTER — Encounter: Payer: Self-pay | Admitting: Vascular Surgery

## 2014-03-14 VITALS — BP 123/84 | HR 65 | Ht 74.5 in | Wt 269.0 lb

## 2014-03-14 DIAGNOSIS — I771 Stricture of artery: Secondary | ICD-10-CM

## 2014-03-14 NOTE — Progress Notes (Signed)
New Subclavian Patient  Referred by:  Francesca Oman, DO [address] [place], [place] [id number]  Reason for referral: Possible left subclavian steal  History of Present Illness  [name] [name] is a 64 y.o. ([birth year]) male who presents with chief complaint: "don't know why I'm here".  Pt has previous complained for R arm pain after sleeping.  This lead to BUE arterial dopplers and a B carotid study.  Previous carotid studies demonstrated: RICA 7-62% stenosis, LICA 8-31% stenosis, possible early L subclavian steal.  Patient has no history of TIA or stroke symptom.  The patient has never had amaurosis fugax or monocular blindness.  The patient has never had facial drooping or hemiplegia.  The patient has never had receptive or expressive aphasia.   The patient denies any left arm symptoms.  He has never had claudication equivalent sx.  The patient's risks factors for atherosclerosis include: HTN.  Past Medical History  Diagnosis Date  . Hypertension   . Systolic heart failure     EF is 35 to 40%  . Asthma     'as a child"   . Shortness of breath     "comes on at anytime" (01/18/2013)  . Sickle cell trait   . Stroke 12/1988  . CHF (congestive heart failure)   . Blood clotting disorder     pt. states his family has a history of "slow clotting", had bleeding after oral surgery age 17 but none with extractions 2015 or adult surgeries; thought father (now deceased) had a hx of hemophilia  . History of gout   . Daily headache     are due to the medication he is taking  . Brain aneurysm 01/11/1989  . Arthritis     "left hip; right knee" (10/09/2013)  . Full dentures   . Wears glasses   . Brain aneurysm     Past Surgical History  Procedure Laterality Date  . 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 Cardiac catheterization  12/1988; 12/2012  . Multiple tooth extractions  spring 2015    "took out 7"  . Total hip arthroplasty Left 10/08/2013    anterior approach  . Hernia  repair  2004?    "above the navel"   . Gum surgery  1966  . Total hip arthroplasty Left 10/08/2013    Procedure: TOTAL HIP ARTHROPLASTY ANTERIOR APPROACH;  Surgeon: Renette Butters, MD;  Location: [facility];  Service: Orthopedics;  Laterality: Left;  [name] Muscle biopsy N/A 12/06/2013    Procedure: FAT PAD BIOPSY;  Surgeon: Erroll Luna, MD;  Location: [facility];  Service: General;  Laterality: N/A;  . Left and right heart catheterization with coronary angiogram N/A 01/21/2013    Procedure: LEFT AND RIGHT HEART CATHETERIZATION WITH CORONARY ANGIOGRAM;  Surgeon: Minus Breeding, MD;  Location: [facility] CATH LAB;  Service: Cardiovascular;  Laterality: N/A;    History   Social History  . Marital Status: Married    Spouse Name: Gregary Signs  . Number of Children: 4  . Years of Education: Assoc.   Occupational History  .      not employed   Social History Main Topics  . Smoking status: Never Smoker   . Smokeless tobacco: Never Used  . Alcohol Use: No  . Drug Use: Yes    Special: Marijuana     Comment: "stopped smoking marijuana in the late 1990's"  . Sexual Activity: Yes   Other Topics Concern  .  Not on file   Social History Narrative   Patient consumes caffeine occas.    Family History  Problem Relation Age of Onset  . Heart attack Mother   . Hypertension Mother   . Varicose Veins Mother   . Cancer Father   . Hypertension Father   . Hypertension Sister   . Cancer Brother   . Hypertension Brother     Current Outpatient Prescriptions on File Prior to Visit  Medication Sig Dispense Refill  . allopurinol (ZYLOPRIM) 150 mg TABS tablet Take 0.5 tablets (150 mg total) by mouth daily. 30 tablet 0  . carvedilol (COREG) 25 MG tablet Take 1 tablet (25 mg total) by mouth 2 (two) times daily with a meal. 60 tablet 6  . furosemide (LASIX) 40 MG tablet TAKE 1 1/2 TABLETS BY MOUTH TWICE DAILY 90 tablet 11  . hydrALAZINE (APRESOLINE) 50 MG tablet Take 1 tablet (50 mg total) by mouth 3  (three) times daily. 270 tablet 3  . isosorbide dinitrate (ISORDIL) 20 MG tablet Take 1 tablet (20 mg total) by mouth 3 (three) times daily. 270 tablet 3  . Magnesium 250 MG TABS Take 250 mg by mouth daily.    [name] spironolactone (ALDACTONE) 25 MG tablet Take 25 mg by mouth daily.    [name] lisinopril (PRINIVIL,ZESTRIL) 40 MG tablet TAKE 1 TABLET BY MOUTH DAILY 90 tablet 0   No current facility-administered medications on file prior to visit.    Allergies  Allergen Reactions  . Hydrocodone     Itch and crazy    REVIEW OF SYSTEMS:  (Positives checked otherwise negative)  CARDIOVASCULAR:  []  chest pain, []  chest pressure, []  palpitations, []  shortness of breath when laying flat, []  shortness of breath with exertion,  []  pain in feet when walking, []  pain in feet when laying flat, []  history of blood clot in veins (DVT), []  history of phlebitis, []  swelling in legs, []  varicose veins  PULMONARY:  []  productive cough, []  asthma, []  wheezing  NEUROLOGIC:  []  weakness in arms or legs, []  numbness in arms or legs, []  difficulty speaking or slurred speech, []  temporary loss of vision in one eye, []  dizziness  HEMATOLOGIC:  []  bleeding problems, []  problems with blood clotting too easily  MUSCULOSKEL:  [x]  joint pain, []  joint swelling  GASTROINTEST:  []  vomiting blood, []  blood in stool     GENITOURINARY:  []  burning with urination, []  blood in urine  PSYCHIATRIC:  []  history of major depression  INTEGUMENTARY:  []  rashes, []  ulcers  CONSTITUTIONAL:  []  fever, []  chills  For VQI Use Only  PRE-ADM LIVING: Home  AMB STATUS: Ambulatory  CAD Sx: None  PRIOR CHF: Asymptomatic, History of CHF  STRESS TEST: [x]  No, [ ]  Normal, [ ]  + ischemia, [ ]  + MI, [ ]  Both   Physical Examination  Filed Vitals:   03/14/14 1021 03/14/14 1022  BP: 137/76 123/84  Pulse: 65   Height: 6' 2.5" (1.892 m) 6' 2.5" (1.892 m)  Weight: 269 lb (122.018 kg) 269 lb (122.018 kg)  SpO2:  91%   Body mass index  is 34.09 kg/(m^2).  General: A&O x 3, WDWN  Head: [place]/AT  Ear/Nose/Throat: Hearing grossly intact, nares w/o erythema or drainage, oropharynx w/o Erythema/Exudate, Mallampati score: 3  Eyes: PERRLA, EOMI  Neck: Supple, no nuchal rigidity, no palpable LAD  Pulmonary: Sym exp, good air movt, CTAB, no rales, rhonchi, & wheezing  Cardiac: RRR, Nl S1, S2, no Murmurs, rubs or  gallops  Vascular: Vessel Right Left  Radial Palpable Palpable  Brachial Palpable Palpable  Carotid Palpable, without bruit Palpable, without bruit  Aorta  Not palpable N/A  Femoral Palpable Palpable  Popliteal Not palpable Not palpable  PT FaintlyPalpable Faintly Palpable  DP Faintly Palpable Faintly Palpable   Gastrointestinal: soft, NTND, -G/R, - HSM, - masses, - CVAT B  Musculoskeletal: M/S 5/5 throughout , Extremities without ischemic changes , mild venous skin changes B  Neurologic: CN 2-12 intact , Pain and light touch intact in extremities , Motor exam as listed above  Psychiatric: Judgment intact, Mood & affect appropriate for pt's clinical situation  Dermatologic: See M/S exam for extremity exam, no rashes otherwise noted  Lymph : No Cervical, Axillary, or Inguinal lymphadenopathy   Non-Invasive Vascular Imaging  Outside CAROTID DUPLEX (Date: 01/13/14)  R ICA stenosis: 1-39%  R VA: patent and antegrade  L ICA stenosis: 1-39%  L VA: patent and bidrectional  BLE arterial doppler (Date: 01/04/14)  Triphasic waveforms throughout   Medical Decision Making  Ramey Schiff is a 64 y.o. male who presents with: asx possible L SCA stenosis   Patient is asx from the L arm viewpoint at this point, so no intervention is necessary.  The abnormal study was done in the hospital.  I would repeat it in 6 months to re-evaluate the left subclavian artery and left vertebral artery blood flow.  Based on the patient's vascular studies and examination, I have offered the patient: B carotid duplex in  6 month.  I discussed in depth with the patient the nature of atherosclerosis, and emphasized the importance of maximal medical management including strict control of blood pressure, blood glucose, and lipid levels, obtaining regular exercise, antiplatelet agents, and cessation of smoking.   The patient is currently not on a statin.  He and his cardiologist have not started a statin.  I would consider such as his LDL is >100.  The patient is currently notan anti-platelet.  The patient will be started on ASA 81 mg PO daily.  The patient is aware that without maximal medical management the underlying atherosclerotic disease process will progress, limiting the benefit of any interventions.  Thank you for allowing [place] to participate in this patient's care.  Adele Barthel, MD [facility] Office: [phone] Pager: [phone]  03/14/2014, 10:46 AM

## 2014-03-15 ENCOUNTER — Other Ambulatory Visit: Payer: Self-pay | Admitting: Cardiovascular Disease

## 2014-03-16 ENCOUNTER — Ambulatory Visit (INDEPENDENT_AMBULATORY_CARE_PROVIDER_SITE_OTHER): Payer: Medicaid Other | Admitting: Neurology

## 2014-03-16 DIAGNOSIS — G4739 Other sleep apnea: Secondary | ICD-10-CM

## 2014-03-16 DIAGNOSIS — R9431 Abnormal electrocardiogram [ECG] [EKG]: Secondary | ICD-10-CM

## 2014-03-16 DIAGNOSIS — G479 Sleep disorder, unspecified: Secondary | ICD-10-CM

## 2014-03-16 DIAGNOSIS — G4731 Primary central sleep apnea: Secondary | ICD-10-CM

## 2014-03-16 DIAGNOSIS — G4733 Obstructive sleep apnea (adult) (pediatric): Secondary | ICD-10-CM

## 2014-03-17 NOTE — Sleep Study (Signed)
Please see the scanned sleep study interpretation located in the Procedure tab within the Chart Review section. 

## 2014-03-19 ENCOUNTER — Ambulatory Visit (INDEPENDENT_AMBULATORY_CARE_PROVIDER_SITE_OTHER): Payer: Medicaid Other | Admitting: Internal Medicine

## 2014-03-19 ENCOUNTER — Encounter: Payer: Self-pay | Admitting: Internal Medicine

## 2014-03-19 VITALS — BP 100/49 | HR 78 | Temp 97.9°F | Ht 75.5 in | Wt 267.4 lb

## 2014-03-19 DIAGNOSIS — M169 Osteoarthritis of hip, unspecified: Secondary | ICD-10-CM

## 2014-03-19 DIAGNOSIS — M109 Gout, unspecified: Secondary | ICD-10-CM

## 2014-03-19 DIAGNOSIS — I5022 Chronic systolic (congestive) heart failure: Secondary | ICD-10-CM

## 2014-03-19 DIAGNOSIS — Z0000 Encounter for general adult medical examination without abnormal findings: Secondary | ICD-10-CM

## 2014-03-19 DIAGNOSIS — E785 Hyperlipidemia, unspecified: Secondary | ICD-10-CM

## 2014-03-19 DIAGNOSIS — G4733 Obstructive sleep apnea (adult) (pediatric): Secondary | ICD-10-CM

## 2014-03-19 DIAGNOSIS — I10 Essential (primary) hypertension: Secondary | ICD-10-CM

## 2014-03-19 DIAGNOSIS — I771 Stricture of artery: Secondary | ICD-10-CM

## 2014-03-19 DIAGNOSIS — H903 Sensorineural hearing loss, bilateral: Secondary | ICD-10-CM

## 2014-03-19 LAB — URIC ACID: Uric Acid, Serum: 6.4 mg/dL (ref 4.0–7.8)

## 2014-03-19 NOTE — Progress Notes (Signed)
   Subjective:    Patient ID: [name] [name], male    DOB: Sep 18, 1950, 64 y.o.   MRN: [id number]  HPI Comments: [name] [name] is a 64 year old male with a PMH of chronic systolic HF (EF 22-48% Dec 2015), HTN, prior CVA, OA and CKD2 here for follow-up of chronic conditions.  Please see problem based assessment and plan for update of patient's chronic medical conditions.      Review of Systems  Constitutional: Negative for fever, chills, appetite change and unexpected weight change.  Respiratory: Negative for shortness of breath.   Cardiovascular: Negative for chest pain, palpitations and leg swelling.  Gastrointestinal: Negative for nausea, vomiting, abdominal pain, diarrhea, constipation and blood in stool.  Genitourinary: Negative for dysuria.  Neurological: Negative for weakness and light-headedness.       Filed Vitals:   03/19/14 1501  BP: 100/49  Pulse: 78  Temp: 97.9 F (36.6 C)  TempSrc: Oral  Height: 6' 3.5" (1.918 m)  Weight: 267 lb 6.4 oz (121.292 kg)  SpO2: 100%   Objective:   Physical Exam  Constitutional: He is oriented to person, place, and time. He appears well-developed and well-nourished. No distress.  HENT:  Head: Normocephalic and atraumatic.  Mouth/Throat: No oropharyngeal exudate.  Eyes: EOM are normal. Pupils are equal, round, and reactive to light.  Neck: Neck supple.  Cardiovascular: Normal rate, regular rhythm, normal heart sounds and intact distal pulses.  Exam reveals no gallop and no friction rub.   No murmur heard. Pulmonary/Chest: Effort normal and breath sounds normal. No respiratory distress. He has no wheezes. He has no rales.  Abdominal: Soft. Bowel sounds are normal. He exhibits no distension. There is no tenderness. There is no rebound.  Musculoskeletal: Normal range of motion. He exhibits edema.  Trace pitting edema B/L  Neurological: He is alert and oriented to person, place, and time. No cranial nerve deficit.  Skin: Skin is warm. He is  not diaphoretic.  Psychiatric: He has a normal mood and affect. His behavior is normal.  Vitals reviewed.         Assessment & Plan:  Please see problem based assessment and plan.

## 2014-03-19 NOTE — Patient Instructions (Signed)
General Instructions: 1. I will call you and let you know how much allopurinol to take based on the results of your labwork today.  Please call the audiologist's office, Dr. Delma Freeze so she can send you for hearing aids.  Return to see me in 3 months or sooner if you have problems.   2. Please take all medications as prescribed.     3. If you have worsening of your symptoms or new symptoms arise, please call the clinic ([phone]), or go to the ER immediately if symptoms are severe.    Treatment Goals:  Goals (1 Years of Data) as of 03/19/14          As of Today 03/14/14 03/14/14 02/25/14 02/06/14     Blood Pressure   . Blood Pressure < 150/90  100/49 123/84 137/76 106/66 109/64      Progress Toward Treatment Goals:  Treatment Goal 03/19/2014  Blood pressure at goal    Self Care Goals & Plans:  Self Care Goal 03/19/2014  Manage my medications take my medicines as prescribed; bring my medications to every visit; refill my medications on time  Monitor my health -  Eat healthy foods drink diet soda or water instead of juice or soda; eat more vegetables; eat foods that are low in salt; eat baked foods instead of fried foods  Be physically active -    No flowsheet data found.   Care Management & Community Referrals:  Referral 03/25/2013  Referrals made for care management support financial counselor

## 2014-03-20 NOTE — Progress Notes (Signed)
Internal Medicine Clinic Attending  Case discussed with Dr. Wilson soon after the resident saw the patient.  We reviewed the resident's history and exam and pertinent patient test results.  I agree with the assessment, diagnosis, and plan of care documented in the resident's note.  

## 2014-03-21 ENCOUNTER — Telehealth: Payer: Self-pay | Admitting: Neurology

## 2014-03-21 DIAGNOSIS — G4731 Primary central sleep apnea: Secondary | ICD-10-CM

## 2014-03-21 DIAGNOSIS — G4733 Obstructive sleep apnea (adult) (pediatric): Secondary | ICD-10-CM | POA: Insufficient documentation

## 2014-03-21 DIAGNOSIS — G4739 Other sleep apnea: Secondary | ICD-10-CM

## 2014-03-21 MED ORDER — ATORVASTATIN CALCIUM 20 MG PO TABS: 20.0000 mg | ORAL_TABLET | Freq: Every day | ORAL | Status: DC

## 2014-03-21 MED ORDER — ALLOPURINOL 100 MG PO TABS: 200.0000 mg | ORAL_TABLET | Freq: Every day | ORAL | Status: DC

## 2014-03-21 NOTE — Assessment & Plan Note (Addendum)
BP Readings from Last 3 Encounters:  03/19/14 100/49  03/14/14 123/84  02/25/14 106/66    Lab Results  Component Value Date   NA 135* 01/06/2014   K 3.9 01/06/2014   CREATININE 1.27 01/06/2014    Assessment: Blood pressure control: controlled Progress toward BP goal:  at goal Comments: Compliant with trx.  Knows the names of meds.  He used to experience lightheadedness but says this has not been an issue recently.    Plan: Medications:  continue current medications:  Aldactone 25mg  daily, lisinopril 40mg  daily, Bidil 20-37.5mg  TID, Lasix 60mg  BID, Coreg 25mg  BID Educational resources provided: brochure (denies) Self management tools provided:   Other plans: Continue med regimen and close follow-up with [facility] and cardiology (next appt with Dr. Acie Fredrickson in two weeks).

## 2014-03-21 NOTE — Assessment & Plan Note (Signed)
He is doing well s/p hip replacement.  Not requiring pain meds.  Ambulating without assistance.

## 2014-03-21 NOTE — Assessment & Plan Note (Signed)
Audiology has advised he get hearing aids.  He was given a list of providers.

## 2014-03-21 NOTE — Assessment & Plan Note (Addendum)
Evaluated by Vascular Surgery and no intervention needed at this time.  Continue tight blood pressure control.  He will have follow-up imaging and visit in 6 months.   I have attempted to add a statin in the past but [name] [name] was hesitant and wanted to discuss with cardiology.  He is agreeable to addition of a statin at this time.  Also to start low dose ASA.

## 2014-03-21 NOTE — Assessment & Plan Note (Signed)
10-year ASCVD risk is 9.5% based on most recent lipid panel (04/15) so he should be on moderate to high intensity statin.  He is agreeable to initiating a statin.   - start atorvastatin 20mg  daily

## 2014-03-21 NOTE — Assessment & Plan Note (Addendum)
01/13/14 2D ECHO revealed EF 30-35% with severe LVH, grade 1 DD, mild biatrial enlargement and trace AI.  He is complaint with medical therapy; soft BPs but tolerating at this point.  I appreciate cardiology's care of this patient. - continue Aldactone 25mg  daily, lisinopril 40mg  daily, Bidil 20-37.5mg  TID, Lasix 60mg  BID, Coreg 25mg  BID - follow-up with Dr. Acie Fredrickson on 04/08/14

## 2014-03-21 NOTE — Assessment & Plan Note (Addendum)
No recent gout attack.  He has been compliant with allopurinol 150mg  daily (1/2 tablet) but discontinued colchicine about 1.5 months ago because he was concerned it would harm his kidneys. - uric acid today and titrate allopurinol accordingly - uric acid 6.4 (unchanged in 3 months); will increase allopurinol from 150mg  daily to 200mg  daily and recheck uric acid in 1 month - he can remain off of colchicine since he has stopped it for this amount of time without problem; also better to be off colchicine since he will be starting a statin (increase risk for myopathy with statin/colchicine combo). - recent hepatic function normal and Cr at baseline; will check CMP at next visit along with uric acid

## 2014-03-21 NOTE — Telephone Encounter (Signed)
Please call and inform patient that I have entered an order for treatment with PAP. He did not do that well during the latest sleep study with CPAP, but better with treatment, compared to his baseline sleep study on 02/05/14. I would like to suggest we start him on CPAP at home. We will, therefore, arrange for a machine for home use through a DME (durable medical equipment) company of His choice; and I will see the patient back in follow-up in about 6 weeks. Please also explain to the patient that I will be looking out for compliance data downloaded from the machine, which can be done remotely through a modem at times or stored on an SD card in the back of the machine. At the time of the followup appointment we will discuss sleep study results and how it is going with PAP treatment at home. Please advise patient to bring His machine at the time of the visit; at least for the first visit, even though this is cumbersome. Bringing the machine for every visit after that may not be needed, but often helps for the first visit. Please also make sure, the patient has a follow-up appointment with me in about 6 weeks from the setup date, thanks.   Star Age, MD, PhD [facility])

## 2014-03-21 NOTE — Assessment & Plan Note (Addendum)
Colon Ca screening - No blood in stool recently.  He was evaluated by GI and had planned for Cologuard but his insurance will not cover it.  He says he will call GI office and schedule a colonoscopy.

## 2014-03-21 NOTE — Assessment & Plan Note (Addendum)
Confirmed by sleep study.  CPAP titration study took place on 02/21.

## 2014-03-24 ENCOUNTER — Telehealth: Payer: Self-pay | Admitting: Internal Medicine

## 2014-03-24 NOTE — Telephone Encounter (Signed)
I placed a call to [name] (handles prior approval of elective imaging for [place] Medicaid recipients) regarding denial of 01/13/14 vascular studies for [name] [name].  I informed her that the wrong ordering physician is listed on the paperwork and ask that she correct this information.  She will fax me documents to request appeal by phone.   Duwaine Maxin, DO

## 2014-03-25 ENCOUNTER — Encounter: Payer: Self-pay | Admitting: Neurology

## 2014-03-26 ENCOUNTER — Encounter: Payer: Self-pay | Admitting: *Deleted

## 2014-03-26 NOTE — Telephone Encounter (Signed)
Patient was contacted and informed that treatment in the form of CPAP therapy was advised.  The patient was in agreement and was referred to [facility] for set up.  [facility] was faxed a referral for processing.  Dr. Duwaine Maxin was sent a cpy of the results.   Patient instructed to contact our office 6-8 weeks post set up to schedule a follow up appointment.  The patient gave verbal permission to mail a copy of his test results.

## 2014-04-08 ENCOUNTER — Encounter: Payer: Self-pay | Admitting: Cardiovascular Disease

## 2014-04-08 ENCOUNTER — Ambulatory Visit (INDEPENDENT_AMBULATORY_CARE_PROVIDER_SITE_OTHER): Payer: Medicaid Other | Admitting: Cardiovascular Disease

## 2014-04-08 VITALS — BP 98/60 | HR 84 | Ht 75.5 in | Wt 268.8 lb

## 2014-04-08 DIAGNOSIS — I10 Essential (primary) hypertension: Secondary | ICD-10-CM

## 2014-04-08 DIAGNOSIS — E785 Hyperlipidemia, unspecified: Secondary | ICD-10-CM

## 2014-04-08 DIAGNOSIS — I5022 Chronic systolic (congestive) heart failure: Secondary | ICD-10-CM

## 2014-04-08 MED ORDER — LISINOPRIL 20 MG PO TABS: 20.0000 mg | ORAL_TABLET | Freq: Every day | ORAL | Status: DC

## 2014-04-08 NOTE — Patient Instructions (Signed)
Your physician has recommended you make the following change in your medication:  DECREASE Lisinopril to 20 mg once daily  Call Christen Bame, RN [phone] to report if your blood pressure/dizziness does not improve  Your physician recommends that you schedule a follow-up appointment in: 3 months with Dr. Acie Fredrickson Your physician recommends that you return for lab work in: 3 months on the day of or a few days before your office visit with Dr. Acie Fredrickson.  You will need to FAST for this appointment - nothing to eat or drink after midnight the night before except water.

## 2014-04-08 NOTE — Progress Notes (Signed)
Cardiology Office Note   Date:  04/08/2014   ID:  [name] [name], DOB 1950-06-16, MRN [id number]  PCP:  Duwaine Maxin, DO  Cardiologist:   Acie Fredrickson Wonda Cheng, MD   Chief Complaint  Patient presents with  . Follow-up    CHF   1. Hypertension 2. Chronic systolic Congestive heart failure - EF 35-44% 3. History of stroke 4. Gout  History of Present Illness: [name] [name] is seen back today for a post hospital/TOC visit. [name] He has had longstanding HTN, asthma, and prior CVA. Originally from the [place].   Most recently presented with increased SOB, DOE and orthopnea for 3 weeks - he was found to be hypertensive, using NSAID, etc. Had been taking some Lasix that he was prescribed while living in [place]. He was cathed - had mild CAD with elevated right sided pressures. Bidil added along with ACE. Renal function improved. EF was 35 to 40%.   Comes back today. Here alone. Medicines are all mixed up - on both OTC potassium and prescription and only taking the OTC agent. He is not taking Mobic (which was left on his discharge meds). Has not had his medicines today. His car had to be towed and so he did not eat and thus did not take his medicines. He notes that his breathing is much better. Weight is stable at home. His swelling has improved but not totally resolved. Some ankle pain. Has chronic hip pain and wanted to have THR in the future. No chest pain. Avoiding salt. Does not drink alcohol. Trying to get on Medicaid. Works part time in [place] and needs to return to work so he can buy his medicines. Some headache and he thought it was due to his medicine. He does not have a primary care doctor.   Feb. 12, 2015:  He is seen back for a visit. His BP has remained elevated. He was readmitted to the hospital with shortness of breath. He was aggressively diuresed but then developed gout. He had documented uric acid crystals seen on a joint aspiration.  He is not able to avoid the colchicine. His  blood pressure remains elevated.  Avoids salt - canned food, bacon, sausage.  May 08, 2013;  His BP has been ok at home. He is avoiding salt. He had a severe gouty flare up . He was prescribed colchicine but could not afford it. He is hoping that he can afford it now that he is on Medicaid.   Also has lots of hip pain. Has seen a chiropractor. He likely needs to have a hip replacement.   August 05, 2013: [name] [name] is doing OK. He saw [name] . He's been having some nausea for the past 3-4 weeks. He complains of having headaches with the BIDil - resolves with tylenol.  His edema has resolved   Dec. 17, 2015:  [name] [name] is a 64 yo with hx of CVA, chronic systolic CHF , HTN.  He went to the ER on Sunday / Monday . Was having orthopnea. He received IV lasix and urinated quite a bit. Now is breathing better.   He is having some orthostatic hypotension. He feels dizzy and his BP is typically very low during these times.  He has adjusted his medication schedule such that he will hold his meds if he needs to go out to appointments.    April 08, 2014:   [name] [name] is a 64 y.o. male who presents for follow up of his CHF He  has had some orthostatic hypotension.     Past Medical History  Diagnosis Date  . Hypertension   . Systolic heart failure     EF is 35 to 40%  . Asthma     'as a child"   . Shortness of breath     "comes on at anytime" (01/18/2013)  . Sickle cell trait   . Stroke 12/1988  . CHF (congestive heart failure)   . Blood clotting disorder     pt. states his family has a history of "slow clotting", had bleeding after oral surgery age 89 but none with extractions 2015 or adult surgeries; thought father (now deceased) had a hx of hemophilia  . History of gout   . Daily headache     are due to the medication he is taking  . Brain aneurysm 01/11/1989  . Arthritis     "left hip; right knee" (10/09/2013)  . Full dentures   . Wears glasses   .  Brain aneurysm     Past Surgical History  Procedure Laterality Date  . 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 Cardiac catheterization  12/1988; 12/2012  . Multiple tooth extractions  spring 2015    "took out 7"  . Total hip arthroplasty Left 10/08/2013    anterior approach  . Hernia repair  2004?    "above the navel"   . Gum surgery  1966  . Total hip arthroplasty Left 10/08/2013    Procedure: TOTAL HIP ARTHROPLASTY ANTERIOR APPROACH;  Surgeon: Renette Butters, MD;  Location: [facility];  Service: Orthopedics;  Laterality: Left;  [name] Muscle biopsy N/A 12/06/2013    Procedure: FAT PAD BIOPSY;  Surgeon: Erroll Luna, MD;  Location: [facility];  Service: General;  Laterality: N/A;  . Left and right heart catheterization with coronary angiogram N/A 01/21/2013    Procedure: LEFT AND RIGHT HEART CATHETERIZATION WITH CORONARY ANGIOGRAM;  Surgeon: Minus Breeding, MD;  Location: [facility] CATH LAB;  Service: Cardiovascular;  Laterality: N/A;     Current Outpatient Prescriptions  Medication Sig Dispense Refill  . allopurinol (ZYLOPRIM) 100 MG tablet Take 2 tablets (200 mg total) by mouth daily. 60 tablet 1  . BIDIL 20-37.5 MG per tablet     . carvedilol (COREG) 25 MG tablet TAKE 1 TABLET BY MOUTH TWICE DAILY WITH A MEAL 180 tablet 0  . furosemide (LASIX) 40 MG tablet TAKE 1 1/2 TABLETS BY MOUTH TWICE DAILY 90 tablet 11  . lisinopril (PRINIVIL,ZESTRIL) 40 MG tablet TAKE 1 TABLET BY MOUTH DAILY 90 tablet 0  . Magnesium 250 MG TABS Take 250 mg by mouth daily.    [name] spironolactone (ALDACTONE) 25 MG tablet Take 25 mg by mouth daily.    [name] atorvastatin (LIPITOR) 20 MG tablet Take 1 tablet (20 mg total) by mouth daily. (Patient not taking: Reported on 04/08/2014) 30 tablet 1   No current facility-administered medications for this visit.    Allergies:   Hydrocodone    Social History:  The patient  reports that he has never smoked. He has never used smokeless tobacco. He reports that he  uses illicit drugs (Marijuana). He reports that he does not drink alcohol.   Family History:  The patient's family history includes Cancer in his brother and father; Heart attack in his mother; Hypertension in his brother, father, mother, and sister; Varicose Veins in his mother.    ROS:  Please see the history of present illness.  Review of Systems: Constitutional:  denies fever, chills, diaphoresis, appetite change and fatigue.    HEENT: denies photophobia, eye pain, redness, hearing loss, ear pain, congestion, sore throat, rhinorrhea, sneezing, neck pain, neck stiffness and tinnitus.  Respiratory: denies SOB, DOE, cough, chest tightness, and wheezing.  Cardiovascular: denies chest pain, palpitations and leg swelling.  Has had some dizziness.   Gastrointestinal: denies nausea, vomiting, abdominal pain, diarrhea, constipation, blood in stool.  Genitourinary: denies dysuria, urgency, frequency, hematuria, flank pain and difficulty urinating.  Musculoskeletal: denies  myalgias, back pain, joint swelling, arthralgias and gait problem.   Skin: denies pallor, rash and wound.  Neurological: admits to dizziness, , light-headedness   Hematological: denies adenopathy, easy bruising, personal or family bleeding history.  Psychiatric/ Behavioral: denies suicidal ideation, mood changes, confusion, nervousness, sleep disturbance and agitation.       All other systems are reviewed and negative.    PHYSICAL EXAM: VS:  BP 98/60 mmHg  Pulse 84  Ht 6' 3.5" (1.918 m)  Wt 268 lb 12.8 oz (121.927 kg)  BMI 33.14 kg/m2  SpO2 97% , BMI Body mass index is 33.14 kg/(m^2). GEN: Well nourished, well developed, in no acute distress HEENT: normal Neck: no JVD, carotid bruits, or masses Cardiac: RRR; no murmurs, rubs, or gallops,no edema  Respiratory:  clear to auscultation bilaterally, normal work of breathing GI: soft, nontender, nondistended, + BS MS: no deformity or atrophy Skin: warm and dry, no  rash Neuro:  Strength and sensation are intact Psych: normal   EKG:  EKG is not ordered today.    Recent Labs: 01/06/2014: ALT 16; BUN 17; Creatinine 1.27; Hemoglobin 13.6; Platelets 226; Potassium 3.9; Pro B Natriuretic peptide (BNP) 1817.0*; Sodium 135*    Lipid Panel    Component Value Date/Time   CHOL 184 05/22/2013 1513   TRIG 87 05/22/2013 1513   HDL 42 05/22/2013 1513   CHOLHDL 4.4 05/22/2013 1513   VLDL 17 05/22/2013 1513   LDLCALC 125* 05/22/2013 1513      Wt Readings from Last 3 Encounters:  04/08/14 268 lb 12.8 oz (121.927 kg)  03/19/14 267 lb 6.4 oz (121.292 kg)  03/14/14 269 lb (122.018 kg)      Other studies Reviewed: Additional studies/ records that were reviewed today include: . Review of the above records demonstrates:    ASSESSMENT AND PLAN:  1. Hypertension- improved, in fact, he is having some orthostatic hypotension currentlly. Will decrease his Lisinopril to 20 a day.   2. Chronic systolic Congestive heart failure - EF 35-44%. His hardware symptoms are well controlled. We will need to decrease his lisinopril slightly because of some episodes of orthostatic hypotension. I'll see him again in 3 months for follow-up visit.  3. History of stroke  4. Gout   Current medicines are reviewed at length with the patient today.  The patient does not have concerns regarding medicines.  The following changes have been made:  See above   Labs/ tests ordered today include:  No orders of the defined types were placed in this encounter.     Disposition:   FU with me in 3 months     Signed, Nahser, Wonda Cheng, MD  04/08/2014 8:55 AM    [facility] [address], [place], [place]  [address] Phone: [phone]; Fax: [phone]

## 2014-04-10 ENCOUNTER — Other Ambulatory Visit: Payer: Self-pay | Admitting: *Deleted

## 2014-04-10 MED ORDER — SPIRONOLACTONE 25 MG PO TABS: 25.0000 mg | ORAL_TABLET | Freq: Every day | ORAL | Status: DC

## 2014-05-10 ENCOUNTER — Other Ambulatory Visit: Payer: Self-pay | Admitting: Internal Medicine

## 2014-05-19 ENCOUNTER — Other Ambulatory Visit: Payer: Self-pay

## 2014-05-19 MED ORDER — BIDIL 20-37.5 MG PO TABS: 1.0000 | ORAL_TABLET | Freq: Three times a day (TID) | ORAL | Status: DC

## 2014-05-20 ENCOUNTER — Telehealth: Payer: Self-pay | Admitting: Cardiovascular Disease

## 2014-05-20 MED ORDER — BIDIL 20-37.5 MG PO TABS: 2.0000 | ORAL_TABLET | Freq: Three times a day (TID) | ORAL | Status: DC

## 2014-05-20 NOTE — Telephone Encounter (Signed)
New message       Talk to the nurse about his presc-----he picked it up from the pharmacy and it was wrong---

## 2014-05-20 NOTE — Telephone Encounter (Signed)
I spoke with Guinevere Ferrari, CMA, patient care advocate who received a call from this patient regarding his Rx.  In January, the patient called and reported that the pharmacy could not get Bidil tablets.  At that time, we prescribed the 2 separate medications found in Bidil (Isordil and Hydralazine) per patient request.  Yesterday, a refill request was received from the patient's pharmacy for Bidil, however it was refilled for the incorrect amount (pt takes 2 tabs 3 times per day as opposed to 1 tab 3 times per day).  Rose spoke with patient and patient would like to have the Bidil refilled as he reports that the pharmacy now has adequate supply.  I have corrected the Rx to reflect patient's appropriate dose and Rose states she has made the patient aware.

## 2014-05-21 ENCOUNTER — Encounter: Payer: Self-pay | Admitting: Neurology

## 2014-05-29 ENCOUNTER — Other Ambulatory Visit: Payer: Self-pay | Admitting: Internal Medicine

## 2014-05-30 NOTE — Telephone Encounter (Signed)
Front desk to schedule appointment

## 2014-06-24 ENCOUNTER — Other Ambulatory Visit: Payer: Self-pay

## 2014-06-24 MED ORDER — CARVEDILOL 25 MG PO TABS: ORAL_TABLET | ORAL | Status: DC

## 2014-07-02 ENCOUNTER — Ambulatory Visit (INDEPENDENT_AMBULATORY_CARE_PROVIDER_SITE_OTHER): Payer: Medicaid Other | Admitting: Internal Medicine

## 2014-07-02 ENCOUNTER — Encounter: Payer: Self-pay | Admitting: Internal Medicine

## 2014-07-02 VITALS — BP 90/64 | HR 64 | Temp 98.5°F | Ht 75.5 in | Wt 267.4 lb

## 2014-07-02 DIAGNOSIS — E785 Hyperlipidemia, unspecified: Secondary | ICD-10-CM | POA: Diagnosis not present

## 2014-07-02 DIAGNOSIS — I708 Atherosclerosis of other arteries: Secondary | ICD-10-CM

## 2014-07-02 DIAGNOSIS — I5022 Chronic systolic (congestive) heart failure: Secondary | ICD-10-CM

## 2014-07-02 DIAGNOSIS — H903 Sensorineural hearing loss, bilateral: Secondary | ICD-10-CM

## 2014-07-02 DIAGNOSIS — M1A9XX0 Chronic gout, unspecified, without tophus (tophi): Secondary | ICD-10-CM | POA: Diagnosis not present

## 2014-07-02 DIAGNOSIS — Z0000 Encounter for general adult medical examination without abnormal findings: Secondary | ICD-10-CM

## 2014-07-02 DIAGNOSIS — I771 Stricture of artery: Secondary | ICD-10-CM

## 2014-07-02 DIAGNOSIS — M1612 Unilateral primary osteoarthritis, left hip: Secondary | ICD-10-CM

## 2014-07-02 DIAGNOSIS — Z9989 Dependence on other enabling machines and devices: Secondary | ICD-10-CM | POA: Diagnosis not present

## 2014-07-02 DIAGNOSIS — G4733 Obstructive sleep apnea (adult) (pediatric): Secondary | ICD-10-CM | POA: Diagnosis not present

## 2014-07-02 DIAGNOSIS — Z96642 Presence of left artificial hip joint: Secondary | ICD-10-CM | POA: Diagnosis not present

## 2014-07-02 DIAGNOSIS — M169 Osteoarthritis of hip, unspecified: Secondary | ICD-10-CM

## 2014-07-02 DIAGNOSIS — M10379 Gout due to renal impairment, unspecified ankle and foot: Secondary | ICD-10-CM

## 2014-07-02 DIAGNOSIS — I10 Essential (primary) hypertension: Secondary | ICD-10-CM | POA: Diagnosis not present

## 2014-07-02 NOTE — Assessment & Plan Note (Addendum)
Weight stable.  Asymptomatic.  BPs continue to run soft and still with OH and headache related to Bidil but HF symptoms improved (no dyspnea or edema).  The patient would prefer checking with Dr. Acie Fredrickson before adjustments are made to his HF medications.  He is on max dose Coreg so could consider decreasing AM dose.  He is on max dose BiDil so another consideration would be decreasing the AM BiDil dose.  I will leave meds as is for now.  I have sent a message to Dr. Acie Fredrickson for his input.   - continue Aldactone 25mg  daily, lisinopril 20mg  daily, Bidil 40-75mg  daily, Lasix 60mg  BID, Coreg 25mg  BID (HR 60s) - patient to schedule routine follow-up with Dr. Acie Fredrickson this month  07/04/14 Addendum:  Dr. Acie Fredrickson is in agreement with plan to decrease first BiDil dose of the day to one tablet (20-37.5) and see if this helps with symptom relief.  Will keep the remaining two Bidil doses of the day the same (40-75mg ).  Will keep Coreg and other medications the same.  Cr also took a slight bump which could be due to combo of medications and low BP.  Will recheck next week to see if it has improved otherwise may need to adjust medications further.

## 2014-07-02 NOTE — Assessment & Plan Note (Signed)
Currently being fit for a hearing aid.  He says he needs two but medicaid will only cover one.

## 2014-07-02 NOTE — Assessment & Plan Note (Signed)
He did not schedule the colonoscopy.  Agreeable to re-referral today.

## 2014-07-02 NOTE — Patient Instructions (Signed)
1. I will call you regarding your labs.  Please arrange follow-up with Dr. Acie Fredrickson this month.   2. Please take all medications as prescribed.    3. If you have worsening of your symptoms or new symptoms arise, please call the clinic ([phone]), or go to the ER immediately if symptoms are severe.  Return to see me in 3 months or sooner if needed.

## 2014-07-02 NOTE — Assessment & Plan Note (Signed)
He says he is sleeping much better now that he has CPAP.

## 2014-07-02 NOTE — Assessment & Plan Note (Addendum)
No recent gout attack.  Compliant with allopurinol 200mg  daily.  Will check uric acid, CMP today.  Will continue to carefully titrate allopurinol to uric acid < 6.  07/04/14 Addendum:  Uric acid 5.8.  Will continue allopurinol 200mg  daily (ok with current estimated GFR).   Cr elevated but I have made changes to BP/HF med, will recheck renal function next week.  Will try to keep allopurinol dose at this level to control gout but if needed may need to decrease dose.  Will plan to check uric acid q6 monthly.

## 2014-07-02 NOTE — Assessment & Plan Note (Addendum)
BP Readings from Last 3 Encounters:  07/02/14 83/67  04/08/14 98/60  03/19/14 100/49    Lab Results  Component Value Date   NA 135* 01/06/2014   K 3.9 01/06/2014   CREATININE 1.27 01/06/2014    Assessment: Blood pressure control:  low Progress toward BP goal:    below goal Comments: Still with lightheadedness despite lisinopril decrease to 20mg  in March by cardiology.  He reports lightheadedness and headache after the first two BiDil doses on 4/7 days per week.  He does not have OH symptoms at night.  He seems to be ok with this and works around symptoms (he lays down shortly after taking BiDil doses and he delays taking the med if he knows he needs to drive somewhere).  Despite SBP 83 in clinic today he is asymptomatic.  Repeat SBP is 90.  HR is 64.  Plan: Medications:  continue current medications:  Coreg 25mg  BID, lisinopril 20mg  daily, Lasix 60mg  BID, BiDil 40-75mg  TID, spironolactone 25mg  daily.  I suggested decreasing Coreg to see if this provides relief but he would prefer checking with Dr. Acie Fredrickson before adjustments are made in his HF medications.  He is on max dose BiDil so another consideration would be decreasing the AM BiDil dose.  I will leave meds as is for now.  I have sent a message to Dr. Acie Fredrickson for his input.   Educational resources provided: brochure (denies) Self management tools provided:   Other plans: CMP today.  Patient to arrange routine follow-up with Dr. Acie Fredrickson this month.  RTC in 3 months.  07/04/14 Addendum:  Dr. Acie Fredrickson is in agreement with plan to decrease first BiDil dose of the day to one tablet (20-37.5) and see if this helps with symptom relief.  Will keep the remaining two Bidil doses of the day the same (40-75mg ).  Will keep Coreg and other medications the same.  Cr also took a slight bump which could be due to combo of medications and low BP.  Will recheck next week to see if it has improved otherwise may need to adjust medications further.

## 2014-07-02 NOTE — Progress Notes (Signed)
Subjective:    Patient ID: [name] [name], male    DOB: 1950/08/13, 64 y.o.   MRN: [id number]  HPI Comments: [name] is a 64 year old male with a PMH as below here for follow-up of chronic conditions.  Please see problem based charting for A&P.    Past Medical History  Diagnosis Date  . Hypertension   . Systolic heart failure     EF is 35 to 40%  . Asthma     'as a child"   . Shortness of breath     "comes on at anytime" (01/18/2013)  . Sickle cell trait   . Stroke 12/1988  . CHF (congestive heart failure)   . Blood clotting disorder     pt. states his family has a history of "slow clotting", had bleeding after oral surgery age 84 but none with extractions 2015 or adult surgeries; thought father (now deceased) had a hx of hemophilia  . History of gout   . Daily headache     are due to the medication he is taking  . Brain aneurysm 01/11/1989  . Arthritis     "left hip; right knee" (10/09/2013)  . Full dentures   . Wears glasses   . Brain aneurysm    Current Outpatient Prescriptions on File Prior to Visit  Medication Sig Dispense Refill  . allopurinol (ZYLOPRIM) 100 MG tablet Take 2 tablets (200 mg total) by mouth daily. 60 tablet 0  . atorvastatin (LIPITOR) 20 MG tablet TAKE 1 TABLET BY MOUTH DAILY 90 tablet 1  . BIDIL 20-37.5 MG per tablet Take 2 tablets by mouth 3 (three) times daily. 540 tablet 3  . carvedilol (COREG) 25 MG tablet TAKE 1 TABLET BY MOUTH TWICE DAILY WITH A MEAL 180 tablet 1  . furosemide (LASIX) 40 MG tablet TAKE 1 1/2 TABLETS BY MOUTH TWICE DAILY 90 tablet 11  . lisinopril (PRINIVIL,ZESTRIL) 20 MG tablet Take 1 tablet (20 mg total) by mouth daily. 90 tablet 3  . Magnesium 250 MG TABS Take 250 mg by mouth daily.    [name] spironolactone (ALDACTONE) 25 MG tablet Take 1 tablet (25 mg total) by mouth daily. 90 tablet 3   No current facility-administered medications on file prior to visit.    Review of Systems  Constitutional: Negative for fever, chills,  appetite change, fatigue and unexpected weight change.  HENT: Positive for hearing loss.        Getting fitted for hearing aid.  Eyes: Negative for visual disturbance.  Respiratory: Negative for cough and shortness of breath.   Cardiovascular: Negative for chest pain, palpitations and leg swelling.  Gastrointestinal: Negative for nausea, vomiting, abdominal pain, diarrhea, constipation and blood in stool.  Genitourinary: Negative for dysuria, hematuria and difficulty urinating.  Neurological: Positive for light-headedness and headaches. Negative for syncope and weakness.       AM headaches; lightheadedness after first 2 BiDil doses 4/7 days of the week.  Psychiatric/Behavioral: Negative for dysphoric mood.       Filed Vitals:   07/02/14 1546 07/02/14 1636  BP: 83/67 90/64  Pulse: 64 64  Temp: 98.5 F (36.9 C)   TempSrc: Oral   Height: 6' 3.5" (1.918 m)   Weight: 267 lb 6.4 oz (121.292 kg)   SpO2: 97%    Objective:   Physical Exam  Constitutional: He is oriented to person, place, and time. He appears well-developed. No distress.  HENT:  Head: Normocephalic and atraumatic.  Mouth/Throat: Oropharynx is clear and moist.  No oropharyngeal exudate.  Eyes: EOM are normal. Pupils are equal, round, and reactive to light.  Neck: Neck supple. No JVD present.  Cardiovascular: Normal rate, regular rhythm, normal heart sounds and intact distal pulses.  Exam reveals no gallop and no friction rub.   No murmur heard. Pulmonary/Chest: Effort normal and breath sounds normal. No respiratory distress. He has no wheezes. He has no rales.  Abdominal: Soft. Bowel sounds are normal. He exhibits no distension and no mass. There is no tenderness. There is no rebound and no guarding.  Musculoskeletal: Normal range of motion. He exhibits no edema or tenderness.  Neurological: He is alert and oriented to person, place, and time. No cranial nerve deficit.  Patient able to stand and walk without feeling  lightheaded.  Skin: Skin is warm. He is not diaphoretic.  Psychiatric: He has a normal mood and affect. His behavior is normal. Judgment and thought content normal.  Vitals reviewed.         Assessment & Plan:  Please see problem based charting for A&P.

## 2014-07-03 LAB — CBC WITH DIFFERENTIAL/PLATELET
BASOS ABS: 0 10*3/uL (ref 0.0–0.1)
BASOS PCT: 0 % (ref 0–1)
EOS ABS: 0.3 10*3/uL (ref 0.0–0.7)
Eosinophils Relative: 3 % (ref 0–5)
HCT: 40.7 % (ref 39.0–52.0)
Hemoglobin: 13.6 g/dL (ref 13.0–17.0)
LYMPHS PCT: 16 % (ref 12–46)
Lymphs Abs: 1.6 10*3/uL (ref 0.7–4.0)
MCH: 32 pg (ref 26.0–34.0)
MCHC: 33.4 g/dL (ref 30.0–36.0)
MCV: 95.8 fL (ref 78.0–100.0)
MPV: 10.4 fL (ref 8.6–12.4)
Monocytes Absolute: 1 10*3/uL (ref 0.1–1.0)
Monocytes Relative: 10 % (ref 3–12)
Neutro Abs: 7.2 10*3/uL (ref 1.7–7.7)
Neutrophils Relative %: 71 % (ref 43–77)
PLATELETS: 258 10*3/uL (ref 150–400)
RBC: 4.25 MIL/uL (ref 4.22–5.81)
RDW: 13.5 % (ref 11.5–15.5)
WBC: 10.1 10*3/uL (ref 4.0–10.5)

## 2014-07-03 LAB — COMPLETE METABOLIC PANEL WITH GFR
ALBUMIN: 3.4 g/dL — AB (ref 3.5–5.2)
ALK PHOS: 73 U/L (ref 39–117)
ALT: 14 U/L (ref 0–53)
AST: 16 U/L (ref 0–37)
BUN: 20 mg/dL (ref 6–23)
CHLORIDE: 101 meq/L (ref 96–112)
CO2: 28 mEq/L (ref 19–32)
Calcium: 9.6 mg/dL (ref 8.4–10.5)
Creat: 1.54 mg/dL — ABNORMAL HIGH (ref 0.50–1.35)
GFR, Est African American: 55 mL/min — ABNORMAL LOW
GFR, Est Non African American: 48 mL/min — ABNORMAL LOW
Glucose, Bld: 84 mg/dL (ref 70–99)
Potassium: 4.2 mEq/L (ref 3.5–5.3)
Sodium: 138 mEq/L (ref 135–145)
Total Bilirubin: 0.9 mg/dL (ref 0.2–1.2)
Total Protein: 6.6 g/dL (ref 6.0–8.3)

## 2014-07-03 LAB — MAGNESIUM: MAGNESIUM: 1.9 mg/dL (ref 1.5–2.5)

## 2014-07-03 LAB — URIC ACID: Uric Acid, Serum: 5.8 mg/dL (ref 4.0–7.8)

## 2014-07-03 NOTE — Assessment & Plan Note (Signed)
He is scheduled to see Dr. Bridgett Larsson for repeat imagine and follow-up in 2 months.

## 2014-07-03 NOTE — Assessment & Plan Note (Signed)
He is compliant with statin.  No reported ADRs.  He has asked me to check on his liver function.   - continue atorvastatin 20mg  daily - check CMP

## 2014-07-03 NOTE — Assessment & Plan Note (Signed)
No hip pain or difficulty ambulating s/p hip replacement 09/2013.

## 2014-07-04 ENCOUNTER — Other Ambulatory Visit: Payer: Self-pay | Admitting: Internal Medicine

## 2014-07-04 MED ORDER — BIDIL 20-37.5 MG PO TABS: 1.0000 | ORAL_TABLET | Freq: Three times a day (TID) | ORAL | Status: DC

## 2014-07-04 MED ORDER — ALLOPURINOL 100 MG PO TABS: 200.0000 mg | ORAL_TABLET | Freq: Every day | ORAL | Status: DC

## 2014-07-04 NOTE — Progress Notes (Signed)
Internal Medicine Clinic Attending  Case discussed with Dr. Wilson at the time of the visit.  We reviewed the resident's history and exam and pertinent patient test results.  I agree with the assessment, diagnosis, and plan of care documented in the resident's note.  

## 2014-07-08 ENCOUNTER — Encounter: Payer: Self-pay | Admitting: Gastroenterology

## 2014-07-11 ENCOUNTER — Other Ambulatory Visit: Payer: Self-pay | Admitting: Internal Medicine

## 2014-07-11 DIAGNOSIS — R7989 Other specified abnormal findings of blood chemistry: Secondary | ICD-10-CM

## 2014-07-14 ENCOUNTER — Other Ambulatory Visit (INDEPENDENT_AMBULATORY_CARE_PROVIDER_SITE_OTHER): Payer: Medicaid Other

## 2014-07-14 DIAGNOSIS — R7989 Other specified abnormal findings of blood chemistry: Secondary | ICD-10-CM

## 2014-07-14 DIAGNOSIS — I10 Essential (primary) hypertension: Secondary | ICD-10-CM

## 2014-07-14 LAB — BASIC METABOLIC PANEL WITH GFR
BUN: 21 mg/dL (ref 6–23)
CALCIUM: 9.2 mg/dL (ref 8.4–10.5)
CHLORIDE: 104 meq/L (ref 96–112)
CO2: 29 meq/L (ref 19–32)
Creat: 1.29 mg/dL (ref 0.50–1.35)
GFR, Est African American: 68 mL/min
GFR, Est Non African American: 59 mL/min — ABNORMAL LOW
GLUCOSE: 91 mg/dL (ref 70–99)
Potassium: 4.2 mEq/L (ref 3.5–5.3)
SODIUM: 142 meq/L (ref 135–145)

## 2014-08-12 ENCOUNTER — Telehealth: Payer: Self-pay | Admitting: *Deleted

## 2014-08-12 NOTE — Telephone Encounter (Signed)
Dr Ardis Hughs, I was reviewing this patients chart for PV.   He has a complicated medical history to include HTN, asthma. Chronic systolic heart failure, CVA, blood clotting d/o, sleep apnea, sickle cell trait, brain aneurysm and many others.  His last echo 01-14-2014 showed an EF of 30-35 %. He has two prior echo's in epic. 07-08-13 EF was 25-30 and then one 01-18-13 that the Ef was 35-40. His last cardio OV was 04-08-14 with Dr Acie Fredrickson and in his note he has a 35-44% EF listed but i cannot find this on a report.  Do you want him to be a direct colon ( he has never had a colon prior to this one scheduled for 8-9 Tuesday with you) or do you want him to have an OV with you or an APP?  Please advise  Thanks much for your time,  Marijean Niemann

## 2014-08-13 NOTE — Telephone Encounter (Signed)
Ok, looks like this was all discussed.  OK to go ahead with the original plan with PV appt and colonoscopy in [place].  Thanks

## 2014-08-13 NOTE — Telephone Encounter (Signed)
[name] [name], Thank you for the note.  Please cancel his PV appt and he will need NGI appt with myself or extender, next available to try to determine if colorectal cancer screening is a relevant issue for him given his signficant comorbid conditions.  Thanks

## 2014-08-13 NOTE — Telephone Encounter (Signed)
Dr Ardis Hughs, I am so sorry I did not see this but this patient saw [name] 02-25-2014 and all his options were discussed. He had decided on a colo guard but when i called him this am he said it's not covered by his insurance that is why a colonoscopy was scheduled.  Does he need another OV? Please advise Thanks and again so sorry, marie PV

## 2014-08-19 ENCOUNTER — Encounter: Payer: Medicaid Other | Admitting: *Deleted

## 2014-08-19 NOTE — Telephone Encounter (Signed)
er

## 2014-08-19 NOTE — Telephone Encounter (Signed)
Per Dr Ardis Hughs and Osvaldo Angst CRNA pt needs to have procedure at [facility] due to low EF. Pt was made aware by [name] CMA for Dr Ardis Hughs, pt states he was told she will call him back with dates and times for his PV and colon. Pt is aware of reason for the change, questions answered.  [name] PV

## 2014-08-22 ENCOUNTER — Telehealth: Payer: Self-pay

## 2014-08-22 NOTE — Telephone Encounter (Signed)
Per Dr Ardis Hughs and Osvaldo Angst CRNA pt needs to have procedure at [facility] due to low EF. Pt was made aware by [name] CMA for Dr Ardis Hughs, pt states he was told she will call him back with dates and times for his PV and colon. Pt is aware of reason for the change, questions answered.  [name] [name]            Steva Ready, RN at 08/19/2014 10:26 AM     Status: Signed       Expand All Collapse All   er            [name], MD at 08/13/2014 8:32 PM     Status: Avinger, looks like this was all discussed. OK to go ahead with the original plan with PV appt and colonoscopy in [place]. Thanks             Steva Ready, RN at 08/13/2014 8:25 AM     Status: Signed       Expand All Collapse All   Dr Ardis Hughs, I am so sorry I did not see this but this patient saw [name] 02-25-2014 and all his options were discussed. He had decided on a colo guard but when i called him this am he said it's not covered by his insurance that is why a colonoscopy was scheduled. Does he need another OV? Please advise Thanks and again so sorry, marie PV            [name], MD at 08/13/2014 7:32 AM     Status: Signed       Expand All Collapse All   [name], Thank you for the note. Please cancel his PV appt and he will need NGI appt with myself or extender, next available to try to determine if colorectal cancer screening is a relevant issue for him given his signficant comorbid conditions.  Thanks             Steva Ready, RN at 08/12/2014 12:58 PM     Status: Signed       Expand All Collapse All   Dr Ardis Hughs, I was reviewing this patients chart for PV. He has a complicated medical history to include HTN, asthma. Chronic systolic heart failure, CVA, blood clotting d/o, sleep apnea, sickle cell trait, brain aneurysm and many others. His last echo 01-14-2014 showed an EF of 30-35 %. He has two prior echo's in epic.  07-08-13 EF was 25-30 and then one 01-18-13 that the Ef was 35-40. His last cardio OV was 04-08-14 with Dr Acie Fredrickson and in his note he has a 35-44% EF listed but i cannot find this on a report. Do you want him to be a direct colon ( he has never had a colon prior to this one scheduled for 8-9 Tuesday with you) or do you want him to have an OV with you or an APP?  Please advise  Thanks much for your time,  [name] [name]

## 2014-08-28 ENCOUNTER — Other Ambulatory Visit: Payer: Self-pay

## 2014-08-28 DIAGNOSIS — Z1211 Encounter for screening for malignant neoplasm of colon: Secondary | ICD-10-CM

## 2014-08-28 NOTE — Telephone Encounter (Signed)
Colon scheduled, pt instructed and medications reviewed.  Patient instructions mailed to home.  Patient to call with any questions or concerns.  

## 2014-09-01 ENCOUNTER — Encounter (HOSPITAL_COMMUNITY): Payer: Self-pay | Admitting: *Deleted

## 2014-09-01 NOTE — Progress Notes (Signed)
09-01-14 1520 Received call from [facility] "[facility]" -stating pt told to call them, has no bowel prep instructions- however per Guadelupe Sabin, RN- Pres[facility] testing Dept- "pt told to call MD office for Instructions if he doesn't have" - and Endo  made aware of this. Berlin Hun, RN

## 2014-09-02 ENCOUNTER — Encounter: Payer: Medicaid Other | Admitting: Gastroenterology

## 2014-09-04 ENCOUNTER — Ambulatory Visit (HOSPITAL_COMMUNITY): Payer: Medicaid Other | Admitting: Certified Registered Nurse Anesthetist

## 2014-09-04 ENCOUNTER — Encounter (HOSPITAL_COMMUNITY): Payer: Self-pay

## 2014-09-04 ENCOUNTER — Ambulatory Visit (HOSPITAL_COMMUNITY)
Admission: RE | Admit: 2014-09-04 | Discharge: 2014-09-04 | Disposition: A | Discharge status: Home / Self Care | Payer: Medicaid Other | Source: Ambulatory Visit | Attending: Gastroenterology | Admitting: Gastroenterology

## 2014-09-04 ENCOUNTER — Encounter (HOSPITAL_COMMUNITY)
Admission: RE | Disposition: A | Discharge status: Home / Self Care | Payer: Self-pay | Source: Ambulatory Visit | Attending: Gastroenterology

## 2014-09-04 DIAGNOSIS — I5022 Chronic systolic (congestive) heart failure: Secondary | ICD-10-CM | POA: Diagnosis not present

## 2014-09-04 DIAGNOSIS — D573 Sickle-cell trait: Secondary | ICD-10-CM | POA: Insufficient documentation

## 2014-09-04 DIAGNOSIS — I10 Essential (primary) hypertension: Secondary | ICD-10-CM | POA: Diagnosis not present

## 2014-09-04 DIAGNOSIS — Z1211 Encounter for screening for malignant neoplasm of colon: Secondary | ICD-10-CM | POA: Diagnosis not present

## 2014-09-04 DIAGNOSIS — M109 Gout, unspecified: Secondary | ICD-10-CM | POA: Diagnosis not present

## 2014-09-04 DIAGNOSIS — Z8673 Personal history of transient ischemic attack (TIA), and cerebral infarction without residual deficits: Secondary | ICD-10-CM | POA: Insufficient documentation

## 2014-09-04 DIAGNOSIS — I739 Peripheral vascular disease, unspecified: Secondary | ICD-10-CM | POA: Diagnosis not present

## 2014-09-04 DIAGNOSIS — M1711 Unilateral primary osteoarthritis, right knee: Secondary | ICD-10-CM | POA: Insufficient documentation

## 2014-09-04 DIAGNOSIS — I671 Cerebral aneurysm, nonruptured: Secondary | ICD-10-CM | POA: Diagnosis not present

## 2014-09-04 DIAGNOSIS — K635 Polyp of colon: Secondary | ICD-10-CM | POA: Insufficient documentation

## 2014-09-04 DIAGNOSIS — Z885 Allergy status to narcotic agent status: Secondary | ICD-10-CM | POA: Diagnosis not present

## 2014-09-04 DIAGNOSIS — K5730 Diverticulosis of large intestine without perforation or abscess without bleeding: Secondary | ICD-10-CM | POA: Insufficient documentation

## 2014-09-04 DIAGNOSIS — J45909 Unspecified asthma, uncomplicated: Secondary | ICD-10-CM | POA: Diagnosis not present

## 2014-09-04 DIAGNOSIS — G4730 Sleep apnea, unspecified: Secondary | ICD-10-CM | POA: Insufficient documentation

## 2014-09-04 DIAGNOSIS — D124 Benign neoplasm of descending colon: Secondary | ICD-10-CM | POA: Diagnosis not present

## 2014-09-04 DIAGNOSIS — M1612 Unilateral primary osteoarthritis, left hip: Secondary | ICD-10-CM | POA: Insufficient documentation

## 2014-09-04 DIAGNOSIS — D688 Other specified coagulation defects: Secondary | ICD-10-CM | POA: Insufficient documentation

## 2014-09-04 HISTORY — PX: COLONOSCOPY WITH PROPOFOL: SHX5780

## 2014-09-04 SURGERY — COLONOSCOPY WITH PROPOFOL
Anesthesia: Monitor Anesthesia Care

## 2014-09-04 MED ORDER — EPHEDRINE SULFATE 50 MG/ML IJ SOLN
INTRAMUSCULAR | Status: DC | PRN
  Administered 2014-09-04: 5 mg via INTRAVENOUS

## 2014-09-04 MED ORDER — LACTATED RINGERS IV SOLN
INTRAVENOUS | Status: DC
  Administered 2014-09-04: 1000 mL via INTRAVENOUS

## 2014-09-04 MED ORDER — PROPOFOL INFUSION 10 MG/ML OPTIME
INTRAVENOUS | Status: DC | PRN
  Administered 2014-09-04: 120 ug/kg/min via INTRAVENOUS

## 2014-09-04 MED ORDER — PROPOFOL 10 MG/ML IV BOLUS
INTRAVENOUS | Status: DC | PRN
  Administered 2014-09-04 (×2): 10 mg via INTRAVENOUS
  Administered 2014-09-04 (×2): 20 mg via INTRAVENOUS

## 2014-09-04 MED ORDER — SODIUM CHLORIDE 0.9 % IV SOLN: INTRAVENOUS | Status: DC

## 2014-09-04 MED ORDER — FENTANYL CITRATE (PF) 100 MCG/2ML IJ SOLN: 25.0000 ug | INTRAMUSCULAR | Status: DC | PRN

## 2014-09-04 MED ORDER — LIDOCAINE HCL (CARDIAC) 20 MG/ML IV SOLN
INTRAVENOUS | Status: AC
  Filled 2014-09-04: qty 5

## 2014-09-04 MED ORDER — ONDANSETRON HCL 4 MG/2ML IJ SOLN
INTRAMUSCULAR | Status: DC | PRN
  Administered 2014-09-04: 4 mg via INTRAVENOUS

## 2014-09-04 MED ORDER — PROPOFOL 10 MG/ML IV BOLUS
INTRAVENOUS | Status: AC
Start: 2014-09-04 — End: 2014-09-04
  Filled 2014-09-04: qty 20

## 2014-09-04 MED ORDER — LACTATED RINGERS IV SOLN: INTRAVENOUS | Status: DC

## 2014-09-04 MED ORDER — ONDANSETRON HCL 4 MG/2ML IJ SOLN
INTRAMUSCULAR | Status: AC
  Filled 2014-09-04: qty 2

## 2014-09-04 MED ORDER — LIDOCAINE HCL (CARDIAC) 20 MG/ML IV SOLN
INTRAVENOUS | Status: DC | PRN
  Administered 2014-09-04: 100 mg via INTRAVENOUS

## 2014-09-04 MED ORDER — PROPOFOL 10 MG/ML IV BOLUS
INTRAVENOUS | Status: AC
  Filled 2014-09-04: qty 20

## 2014-09-04 SURGICAL SUPPLY — 21 items

## 2014-09-04 NOTE — Op Note (Signed)
[facility] [place], [address]   COLONOSCOPY PROCEDURE REPORT  PATIENT: [name] [name], [name] [name]  MR#: [id number] BIRTHDATE: [birth year] , 27  yrs. old GENDER: male ENDOSCOPIST: [name], MD REFERRED BY: PROCEDURE DATE:  09/04/2014 PROCEDURE:   Colonoscopy, screening and Colonoscopy with snare polypectomy First Screening Colonoscopy - Avg.  risk and is 50 yrs.  old or older Yes.  Prior Negative Screening - Now for repeat screening. N/A  History of Adenoma - Now for follow-up colonoscopy & has been > or = to 3 yrs.  N/A  Polyps removed today? Yes ASA CLASS:   Class III INDICATIONS:Screening for colonic neoplasia and Colorectal Neoplasm Risk Assessment for this procedure is average risk. MEDICATIONS: Monitored anesthesia care  DESCRIPTION OF PROCEDURE:   After the risks benefits and alternatives of the procedure were thoroughly explained, informed consent was obtained.  The digital rectal exam revealed no abnormalities of the rectum.   The EC-[id number] ([id number])  endoscope was introduced through the anus and advanced to the cecum, which was identified by both the appendix and ileocecal valve. No adverse events experienced.   The quality of the prep was excellent.  The instrument was then slowly withdrawn as the colon was fully examined. Estimated blood loss is zero unless otherwise noted in this procedure report.   COLON FINDINGS: A sessile polyp measuring 3 mm in size was found in the descending colon.  A polypectomy was performed with a cold snare.  The resection was complete, the polyp tissue was completely retrieved and sent to histology.   There was mild diverticulosis noted in the left colon.   The examination was otherwise normal. Retroflexed views revealed no abnormalities. The time to cecum = 3.3 Withdrawal time = 10.9   The scope was withdrawn and the procedure completed. COMPLICATIONS: There were no immediate complications.  ENDOSCOPIC  IMPRESSION: 1.   Sessile polyp was found in the descending colon; polypectomy was performed with a cold snare 2.   Mild diverticulosis was noted in the left colon 3.   The examination was otherwise normal  RECOMMENDATIONS: If the polyp(s) removed today are proven to be adenomatous (pre-cancerous) polyps, you will need a repeat colonoscopy in 5 years.  Otherwise you should continue to follow colorectal cancer screening guidelines for "routine risk" patients with colonoscopy in 10 years.  You will receive a letter within 1-2 weeks with the results of your biopsy as well as final recommendations.  Please call my office if you have not received a letter after 3 weeks.  eSigned:  [name], MD 09/04/2014 8:56 AM

## 2014-09-04 NOTE — H&P (Signed)
HPI: This is a man at routine risk for colon cancer     Past Medical History  Diagnosis Date  . Hypertension   . Systolic heart failure     EF is 35 to 40%  . Asthma     'as a child"   . Shortness of breath     "comes on at anytime" (01/18/2013)  . Sickle cell trait   . Stroke 12/1988  . CHF (congestive heart failure)   . Blood clotting disorder     pt. states his family has a history of "slow clotting", had bleeding after oral surgery age 64 but none with extractions 2015 or adult surgeries; thought father (now deceased) had a hx of hemophilia  . History of gout   . Daily headache     are due to the medication he is taking  . Brain aneurysm 01/11/1989  . Full dentures   . Wears glasses   . Brain aneurysm   . Arthritis     "left hip; right knee" (10/09/2013)    Past Surgical History  Procedure Laterality Date  . 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 Cardiac catheterization  12/1988; 12/2012  . Multiple tooth extractions  spring 2015    "took out 7"  . Total hip arthroplasty Left 10/08/2013    anterior approach  . Gum surgery  1966  . Total hip arthroplasty Left 10/08/2013    Procedure: TOTAL HIP ARTHROPLASTY ANTERIOR APPROACH;  Surgeon: Renette Butters, MD;  Location: [place];  Service: Orthopedics;  Laterality: Left;  [name] Muscle biopsy N/A 12/06/2013    Procedure: FAT PAD BIOPSY;  Surgeon: Erroll Luna, MD;  Location: [place];  Service: General;  Laterality: N/A;  . Left and right heart catheterization with coronary angiogram N/A 01/21/2013    Procedure: LEFT AND RIGHT HEART CATHETERIZATION WITH CORONARY ANGIOGRAM;  Surgeon: Minus Breeding, MD;  Location: [name] CATH LAB;  Service: Cardiovascular;  Laterality: N/A;  . Hernia repair  2004?    "above the navel"     Current Facility-Administered Medications  Medication Dose Route Frequency Provider Last Rate Last Dose  . 0.9 %  sodium chloride infusion   Intravenous Continuous [name],  MD      . fentaNYL (SUBLIMAZE) injection 25-50 mcg  25-50 mcg Intravenous Q5 min PRN Rod Mae, MD      . lactated ringers infusion   Intravenous Continuous Rod Mae, MD      . lactated ringers infusion   Intravenous Continuous [name], MD        Allergies as of 08/28/2014 - Review Complete 07/02/2014  Allergen Reaction Noted  . Hydrocodone  06/06/2013    Family History  Problem Relation Age of Onset  . Heart attack Mother   . Hypertension Mother   . Varicose Veins Mother   . Cancer Father   . Hypertension Father   . Hypertension Sister   . Cancer Brother   . Hypertension Brother     Social History   Social History  . Marital Status: Married    Spouse Name: Gregary Signs  . Number of Children: 4  . Years of Education: Assoc.   Occupational History  .      not employed   Social History Main Topics  . Smoking status: Never Smoker   . Smokeless tobacco: Never Used  . Alcohol Use: No  . Drug Use: Yes    Special: Marijuana  Comment: "stopped smoking marijuana in the late 1990's"  . Sexual Activity: Yes   Other Topics Concern  . Not on file   Social History Narrative   Patient consumes caffeine occas.     Physical Exam: BP 124/68 mmHg  Pulse 68  Temp(Src) 98.6 F (37 C)  Resp 16  Ht 6\' 3"  (1.905 m)  Wt 267 lb (121.11 kg)  BMI 33.37 kg/m2  SpO2 96% Constitutional: generally well-appearing Psychiatric: alert and oriented x3 Abdomen: soft, nontender, nondistended, no obvious ascites, no peritoneal signs, normal bowel sounds   Assessment and plan: 64 y.o. male with routine risk for colon cancer  For screening colonoscoyp today.   Owens Loffler, MD [facility] 09/04/2014, 8:17 AM

## 2014-09-04 NOTE — Transfer of Care (Signed)
Immediate Anesthesia Transfer of Care Note  Patient: [name] [name]  Procedure(s) Performed: Procedure(s): COLONOSCOPY WITH PROPOFOL (N/A)  Patient Location: ENDO  Anesthesia Type:MAC  Level of Consciousness:  sedated, patient cooperative and responds to stimulation  Airway & Oxygen Therapy:Patient Spontanous Breathing and Patient connected to face mask oxgen  Post-op Assessment:  Report given to ENDO RN and Post -op Vital signs reviewed and stable  Post vital signs:  Reviewed and stable  Last Vitals:  Filed Vitals:   09/04/14 0801  BP: 124/68  Pulse: 68  Temp: 37 C  Resp: 16    Complications: No apparent anesthesia complications

## 2014-09-04 NOTE — Anesthesia Postprocedure Evaluation (Signed)
  Anesthesia Post-op Note  Patient: [name] [name]  Procedure(s) Performed: Procedure(s) (LRB): COLONOSCOPY WITH PROPOFOL (N/A)  Patient Location: PACU  Anesthesia Type: MAC  Level of Consciousness: awake and alert   Airway and Oxygen Therapy: Patient Spontanous Breathing  Post-op Pain: mild  Post-op Assessment: Post-op Vital signs reviewed, Patient's Cardiovascular Status Stable, Respiratory Function Stable, Patent Airway and No signs of Nausea or vomiting  Last Vitals:  Filed Vitals:   09/04/14 0931  BP: 143/82  Pulse: 58  Temp:   Resp: 20    Post-op Vital Signs: stable   Complications: No apparent anesthesia complications

## 2014-09-04 NOTE — Anesthesia Preprocedure Evaluation (Signed)
Anesthesia Evaluation  Patient identified by MRN, date of birth, ID band Patient awake    Reviewed: Allergy & Precautions, H&P , NPO status , Patient's Chart, lab work & pertinent test results, reviewed documented beta blocker date and time   Airway Mallampati: II  TM Distance: >3 FB Neck ROM: full    Dental  (+) Dental Advisory Given, Edentulous Upper, Edentulous Lower   Pulmonary shortness of breath and with exertion, asthma , sleep apnea ,  breath sounds clear to auscultation  Pulmonary exam normal       Cardiovascular hypertension, Pt. on home beta blockers and Pt. on medications + Peripheral Vascular Disease, +CHF and + DOE Normal cardiovascular examRhythm:regular Rate:Normal  EF 35%   Neuro/Psych  Headaches, Hx Brain aneurysm CVA, No Residual Symptoms negative psych ROS   GI/Hepatic negative GI ROS, Neg liver ROS, (+)     substance abuse  ,   Endo/Other  negative endocrine ROS  Renal/GU Renal disease  negative genitourinary   Musculoskeletal  (+) Arthritis -, Osteoarthritis,    Abdominal   Peds  Hematology negative hematology ROS (+) Sickle cell trait ,   Anesthesia Other Findings   Reproductive/Obstetrics negative OB ROS                             Anesthesia Physical Anesthesia Plan  ASA: III  Anesthesia Plan: MAC   Post-op Pain Management:    Induction:   Airway Management Planned:   Additional Equipment:   Intra-op Plan:   Post-operative Plan:   Informed Consent: I have reviewed the patients History and Physical, chart, labs and discussed the procedure including the risks, benefits and alternatives for the proposed anesthesia with the patient or authorized representative who has indicated his/her understanding and acceptance.   Dental Advisory Given  Plan Discussed with: CRNA and Surgeon  Anesthesia Plan Comments:         Anesthesia Quick Evaluation

## 2014-09-05 ENCOUNTER — Encounter (HOSPITAL_COMMUNITY): Payer: Self-pay | Admitting: Gastroenterology

## 2014-09-11 ENCOUNTER — Encounter: Payer: Self-pay | Admitting: Vascular Surgery

## 2014-09-12 ENCOUNTER — Encounter: Payer: Self-pay | Admitting: Vascular Surgery

## 2014-09-12 ENCOUNTER — Ambulatory Visit (HOSPITAL_COMMUNITY)
Admission: RE | Admit: 2014-09-12 | Discharge: 2014-09-12 | Disposition: A | Discharge status: Home / Self Care | Payer: Medicaid Other | Source: Ambulatory Visit | Attending: Vascular Surgery | Admitting: Vascular Surgery

## 2014-09-12 ENCOUNTER — Ambulatory Visit (INDEPENDENT_AMBULATORY_CARE_PROVIDER_SITE_OTHER): Payer: Medicaid Other | Admitting: Vascular Surgery

## 2014-09-12 VITALS — BP 106/70 | HR 70 | Ht 75.0 in | Wt 259.5 lb

## 2014-09-12 DIAGNOSIS — I771 Stricture of artery: Secondary | ICD-10-CM

## 2014-09-12 NOTE — Progress Notes (Signed)
Established Carotid Patient  History of Present Illness  [name] [name] is a 64 y.o. (Nov 26, 1950) male who presents with chief complaint: routine follow up for possible L SCA stenosis.  Previous carotid studies demonstrated: RICA <71% stenosis, LICA <06% stenosis.  Patient has history of TIA or stroke symptom.  The patient has never had amaurosis fugax or monocular blindness.  The patient has never had facial drooping or hemiplegia.  The patient has never had receptive or expressive aphasia.  He current denies any arm sx.  He has never had claudication equivalent sx in either arm.  The patient's PMH, PSH, SH, and FamHx are unchanged from 03/14/14.  Current Outpatient Prescriptions  Medication Sig Dispense Refill  . acetaminophen (TYLENOL) 500 MG tablet Take 1,000 mg by mouth every 6 (six) hours as needed for moderate pain.    [name] allopurinol (ZYLOPRIM) 100 MG tablet Take 2 tablets (200 mg total) by mouth daily. 60 tablet 1  . atorvastatin (LIPITOR) 20 MG tablet TAKE 1 TABLET BY MOUTH DAILY 90 tablet 1  . BIDIL 20-37.5 MG per tablet Take 1-2 tablets by mouth 3 (three) times daily. Take 1 tablet in the morning.  Take 2 tablets for the second and third doses. (Patient taking differently: Take 2 tablets by mouth 3 (three) times daily. ) 540 tablet 3  . carvedilol (COREG) 25 MG tablet TAKE 1 TABLET BY MOUTH TWICE DAILY WITH A MEAL 180 tablet 1  . furosemide (LASIX) 40 MG tablet TAKE 1 1/2 TABLETS BY MOUTH TWICE DAILY 90 tablet 11  . lisinopril (PRINIVIL,ZESTRIL) 20 MG tablet Take 1 tablet (20 mg total) by mouth daily. 90 tablet 3  . Magnesium 250 MG TABS Take 250 mg by mouth daily.    [name] spironolactone (ALDACTONE) 25 MG tablet Take 1 tablet (25 mg total) by mouth daily. 90 tablet 3   No current facility-administered medications for this visit.    Allergies  Allergen Reactions  . Hydrocodone Itching    Itch and crazy    On ROS today: no vertebrobasilar sx, no CVA or TIA sx   Physical  Examination  Filed Vitals:   09/12/14 0834 09/12/14 0837  BP: 101/57 106/70  Pulse: 70   Height: 6\' 3"  (1.905 m)   Weight: 259 lb 8 oz (117.708 kg)   SpO2: 97%    Body mass index is 32.44 kg/(m^2).  General: A&O x 3, WDWN  Eyes: PERRLA, EOMI  Neck: Supple, no nuchal rigidity, no palpable LAD  Pulmonary: Sym exp, good air movt, CTAB, no rales, rhonchi, & wheezing  Cardiac: RRR, Nl S1, S2, no Murmurs, rubs or gallops  Vascular: Vessel Right Left  Radial Palpable Palpable  Brachial Palpable Palpable  Carotid Palpable, without bruit Palpable, without bruit   R BP: SBP 105 L BP: SBP 111  Musculoskeletal: M/S 5/5 throughout , Extremities without ischemic changes   Neurologic: CN 2-12 intact grossly, Pain and light touch intact in extremities , Motor exam as listed above   Non-Invasive Vascular Imaging  CAROTID DUPLEX (Date: to be scheduled): study denied by Medicare   Medical Decision Making  [name] is a 64 y.o. male who presents with: asx B ICA stenosis <40%, no evidence of hemodynamically significant SCA stenosis   There is a -5 mm gradient between the two arms (R to L) so I doubt a significant SCA stenosis.  Based on the patient's vascular studies and examination, I have offered the patient: B carotid duplex to evaluate the L [place]  reversal again.  Med[employer]are has declined the study, so will have to work on getting authorization this coming week.  The patient will come back at that time  I discussed in depth with the patient the nature of atherosclerosis, and emphasized the importance of maximal medical management including strict control of blood pressure, blood glucose, and lipid levels, antiplatelet agents, obtaining regular exercise, and cessation of smoking.    The patient is aware that without maximal medical management the underlying atherosclerotic disease process will progress, limiting the benefit of any interventions. The patient is currently on a  statin: Lipitor.   The patient is currently not on an anti-platelet.  The patient will be started on ASA 81 mg PO daily.  Thank you for allowing [place] to participate in this patient's care.   Adele Barthel, MD [facility] Office: [phone] Pager: [phone]  09/12/2014, 9:34 AM

## 2014-09-18 ENCOUNTER — Encounter (HOSPITAL_COMMUNITY): Payer: Medicaid Other

## 2014-09-18 ENCOUNTER — Other Ambulatory Visit: Payer: Self-pay | Admitting: Vascular Surgery

## 2014-09-18 DIAGNOSIS — I6523 Occlusion and stenosis of bilateral carotid arteries: Secondary | ICD-10-CM

## 2014-09-18 DIAGNOSIS — I771 Stricture of artery: Secondary | ICD-10-CM

## 2014-09-26 ENCOUNTER — Ambulatory Visit: Payer: Medicaid Other | Admitting: Vascular Surgery

## 2014-10-03 ENCOUNTER — Ambulatory Visit (HOSPITAL_COMMUNITY)
Admission: RE | Admit: 2014-10-03 | Discharge: 2014-10-03 | Disposition: A | Discharge status: Home / Self Care | Payer: Medicaid Other | Source: Ambulatory Visit | Attending: Vascular Surgery | Admitting: Vascular Surgery

## 2014-10-03 DIAGNOSIS — I771 Stricture of artery: Secondary | ICD-10-CM

## 2014-10-03 DIAGNOSIS — I6523 Occlusion and stenosis of bilateral carotid arteries: Secondary | ICD-10-CM | POA: Insufficient documentation

## 2014-10-03 DIAGNOSIS — I708 Atherosclerosis of other arteries: Secondary | ICD-10-CM | POA: Diagnosis not present

## 2014-10-08 ENCOUNTER — Encounter: Payer: Self-pay | Admitting: Vascular Surgery

## 2014-10-10 ENCOUNTER — Ambulatory Visit (INDEPENDENT_AMBULATORY_CARE_PROVIDER_SITE_OTHER): Payer: Medicaid Other | Admitting: Vascular Surgery

## 2014-10-10 ENCOUNTER — Encounter: Payer: Self-pay | Admitting: Vascular Surgery

## 2014-10-10 VITALS — BP 115/91 | HR 60 | Temp 98.7°F | Ht 75.0 in | Wt 260.0 lb

## 2014-10-10 DIAGNOSIS — I771 Stricture of artery: Secondary | ICD-10-CM

## 2014-10-10 NOTE — Progress Notes (Signed)
HISTORY AND PHYSICAL     CC:  F/u carotid duplex exam Referring Provider:  Francesca Oman, DO  HPI: This is a 64 y.o. male who saw Dr. Bridgett Larsson last month with chief complaint of possible left SCA stenosis.  A previous carotid study revealed RICA < 10% stenosis and LICA < 25% stenosis.  He does have hx of TIA/stroke.  He has never had amaurosis fugax or monocular blindness. The patient has never had facial drooping or hemiplegia. The patient has never had receptive or expressive aphasia. He current denies any arm sx. He has never had claudication equivalent sx in either arm.  At that visit, there was a 24mm gradient b/w the 2 arms (Right to Left).  Dr. Bridgett Larsson wanted to get a repeat duplex, however, Medicare declined the study and this had to get prior authorization.  He returned and had the study last week.    He returns today to go over the results of his repeat carotid duplex study.  He does have concerns about taking Lipitor and his liver function.  Pt states that since he has stopped taking Mobic, his kidney function has improved.  Past Medical History  Diagnosis Date  . Hypertension   . Systolic heart failure     EF is 35 to 40%  . Asthma     'as a child"   . Shortness of breath     "comes on at anytime" (01/18/2013)  . Sickle cell trait   . Stroke 12/1988  . CHF (congestive heart failure)   . Blood clotting disorder     pt. states his family has a history of "slow clotting", had bleeding after oral surgery age 38 but none with extractions 2015 or adult surgeries; thought father (now deceased) had a hx of hemophilia  . History of gout   . Daily headache     are due to the medication he is taking  . Brain aneurysm 01/11/1989  . Full dentures   . Wears glasses   . Brain aneurysm   . Arthritis     "left hip; right knee" (10/09/2013)    Past Surgical History  Procedure Laterality Date  . 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 Cardiac catheterization  12/1988; 12/2012    . Multiple tooth extractions  spring 2015    "took out 7"  . Total hip arthroplasty Left 10/08/2013    anterior approach  . Gum surgery  1966  . Total hip arthroplasty Left 10/08/2013    Procedure: TOTAL HIP ARTHROPLASTY ANTERIOR APPROACH;  Surgeon: Renette Butters, MD;  Location: [place];  Service: Orthopedics;  Laterality: Left;  [name] Muscle biopsy N/A 12/06/2013    Procedure: FAT PAD BIOPSY;  Surgeon: Erroll Luna, MD;  Location: [facility] [facility];  Service: General;  Laterality: N/A;  . Left and right heart catheterization with coronary angiogram N/A 01/21/2013    Procedure: LEFT AND RIGHT HEART CATHETERIZATION WITH CORONARY ANGIOGRAM;  Surgeon: Minus Breeding, MD;  Location: [facility] CATH LAB;  Service: Cardiovascular;  Laterality: N/A;  . Hernia repair  2004?    "above the navel"   . Colonoscopy with propofol N/A 09/04/2014    Procedure: COLONOSCOPY WITH PROPOFOL;  Surgeon: [name], MD;  Location: [facility];  Service: Endoscopy;  Laterality: N/A;    Allergies  Allergen Reactions  . Hydrocodone Itching    Itch and crazy    Current Outpatient Prescriptions  Medication Sig Dispense Refill  .  acetaminophen (TYLENOL) 500 MG tablet Take 1,000 mg by mouth every 6 (six) hours as needed for moderate pain.    [name] allopurinol (ZYLOPRIM) 100 MG tablet Take 2 tablets (200 mg total) by mouth daily. 60 tablet 1  . atorvastatin (LIPITOR) 20 MG tablet TAKE 1 TABLET BY MOUTH DAILY 90 tablet 1  . BIDIL 20-37.5 MG per tablet Take 1-2 tablets by mouth 3 (three) times daily. Take 1 tablet in the morning.  Take 2 tablets for the second and third doses. (Patient taking differently: Take 2 tablets by mouth 3 (three) times daily. ) 540 tablet 3  . carvedilol (COREG) 25 MG tablet TAKE 1 TABLET BY MOUTH TWICE DAILY WITH A MEAL 180 tablet 1  . furosemide (LASIX) 40 MG tablet TAKE 1 1/2 TABLETS BY MOUTH TWICE DAILY 90 tablet 11  . lisinopril (PRINIVIL,ZESTRIL) 20 MG tablet Take 1 tablet (20 mg total)  by mouth daily. 90 tablet 3  . Magnesium 250 MG TABS Take 250 mg by mouth daily.    [name] spironolactone (ALDACTONE) 25 MG tablet Take 1 tablet (25 mg total) by mouth daily. 90 tablet 3   No current facility-administered medications for this visit.    Family History  Problem Relation Age of Onset  . Heart attack Mother   . Hypertension Mother   . Varicose Veins Mother   . Cancer Father   . Hypertension Father   . Bleeding Disorder Father   . Hypertension Sister   . Cancer Brother   . Hypertension Brother   . Hyperlipidemia Brother     Social History   Social History  . Marital Status: Married    Spouse Name: Gregary Signs  . Number of Children: 4  . Years of Education: Assoc.   Occupational History  .      not employed   Social History Main Topics  . Smoking status: Never Smoker   . Smokeless tobacco: Never Used  . Alcohol Use: No  . Drug Use: Yes    Special: Marijuana     Comment: "stopped smoking marijuana in the late 1990's"  . Sexual Activity: Yes   Other Topics Concern  . Not on file   Social History Narrative   Patient consumes caffeine occas.     ROS: []  Positive   [ ]  Negative   [ x] All sytems reviewed and are negative  Cardiovascular: []  chest pain/pressure []  palpitations []  SOB lying flat []  DOE []  pain in legs while walking []  pain in feet when lying flat []  hx of DVT []  hx of phlebitis []  swelling in legs []  varicose veins  Pulmonary: []  productive cough []  asthma []  wheezing  Neurologic: []  weakness in []  arms []  legs []  numbness in []  arms []  legs [] difficulty speaking or slurred speech []  temporary loss of vision in one eye []  dizziness  Hematologic: []  bleeding problems []  problems with blood clotting easily  GI []  vomiting blood []  blood in stool  GU: []  burning with urination []  blood in urine  Psychiatric: []  hx of major depression  Integumentary: []  rashes []  ulcers  Constitutional: []  fever []   chills   PHYSICAL EXAMINATION:  Filed Vitals:   10/10/14 1533  BP: 115/91  Pulse:   Temp:    Body mass index is 32.5 kg/(m^2).  General:  WDWN in NAD  Non-Invasive Vascular Imaging:   Carotid duplex study 10/03/14: 1.  Less than 40% stenosis bilateral ICA 2.  Right subclavian artery 84cm/s triphasic waveform and left  subclavian artery 115cm/s triphasic waveforms.  ---unable to obtain difference in pressure per exam 01/13/14  Pt meds includes: Statin:  Yes.   Beta Blocker:  Yes.   Aspirin:  No. ACEI:  Yes.   ARB:  No. Other Antiplatelet/Anticoagulant:  No.    ASSESSMENT/PLAN:: 64 y.o. male for f/u for possible left subclavian artery stenosis   -pt's BP are equal bilaterally today -his carotid duplex exam is unremarkable with less than 40% stenosis of bilateral ICA and triphasic waveforms of the bilateral subclavian arteries -will repeat carotid duplex in one year to re-evaluate -Dr. Bridgett Larsson had long discussion regarding Lipitor and pt will have discussion with his PCP about discontinuing statin depending on risks vs benefits as his lipid panel and LFT's are not readily available   Leontine Locket, PA-C [facility] [phone]  Clinic MD:  Pt seen and examined in conjunction with Dr. Bridgett Larsson  Addendum  I have independently interviewed and examined the patient, and I agree with the physician assistant's findings.  Pt's repeat carotid exam demonstrates triphasic waveforms in both subclavian arteries again emphasizing the limitation of the in-hospital vascular imaging.  This patient has no evidence of subclavian artery stenosis.  Adele Barthel, MD [facility] Office: [phone] Pager: [phone]  10/10/2014, 4:41 PM

## 2014-10-13 NOTE — Addendum Note (Signed)
Addended by: Dorthula Rue L on: 10/13/2014 09:49 AM   Modules accepted: Orders

## 2014-10-21 ENCOUNTER — Other Ambulatory Visit: Payer: Self-pay | Admitting: Internal Medicine

## 2014-12-15 ENCOUNTER — Ambulatory Visit: Payer: Medicaid Other | Admitting: Audiology

## 2014-12-23 ENCOUNTER — Ambulatory Visit (INDEPENDENT_AMBULATORY_CARE_PROVIDER_SITE_OTHER): Payer: Medicaid Other | Admitting: Cardiovascular Disease

## 2014-12-23 ENCOUNTER — Encounter: Payer: Self-pay | Admitting: Cardiovascular Disease

## 2014-12-23 VITALS — BP 118/56 | HR 85 | Ht 75.0 in | Wt 263.0 lb

## 2014-12-23 DIAGNOSIS — E785 Hyperlipidemia, unspecified: Secondary | ICD-10-CM | POA: Diagnosis not present

## 2014-12-23 DIAGNOSIS — I5022 Chronic systolic (congestive) heart failure: Secondary | ICD-10-CM | POA: Diagnosis not present

## 2014-12-23 DIAGNOSIS — I10 Essential (primary) hypertension: Secondary | ICD-10-CM

## 2014-12-23 NOTE — Patient Instructions (Signed)
Medication Instructions:  Your physician recommends that you continue on your current medications as directed. Please refer to the Current Medication list given to you today.   Labwork: None Ordered   Testing/Procedures: None Ordered   Follow-Up: Your physician wants you to follow-up in: 6 months with Dr. Nahser.  You will receive a reminder letter in the mail two months in advance. If you don't receive a letter, please call our office to schedule the follow-up appointment.   If you need a refill on your cardiac medications before your next appointment, please call your pharmacy.   Thank you for choosing [facility]! Teasia Zapf, RN [phone]    

## 2014-12-23 NOTE — Progress Notes (Signed)
Cardiology Office Note   Date:  12/23/2014   ID:  [name] [name], DOB 14-Apr-1950, MRN [id number]  PCP:  Duwaine Maxin, DO  Cardiologist:   Acie Fredrickson Wonda Cheng, MD   Chief Complaint  Patient presents with  . Follow-up    chf   1. Hypertension 2. Chronic systolic Congestive heart failure - EF 35-44% 3. History of stroke 4. Gout  History of Present Illness: [name] [name] is seen back today for a post hospital/TOC visit. [name] He has had longstanding HTN, asthma, and prior CVA. Originally from the [place].   Most recently presented with increased SOB, DOE and orthopnea for 3 weeks - he was found to be hypertensive, using NSAID, etc. Had been taking some Lasix that he was prescribed while living in [place]. He was cathed - had mild CAD with elevated right sided pressures. Bidil added along with ACE. Renal function improved. EF was 35 to 40%.   Comes back today. Here alone. Medicines are all mixed up - on both OTC potassium and prescription and only taking the OTC agent. He is not taking Mobic (which was left on his discharge meds). Has not had his medicines today. His car had to be towed and so he did not eat and thus did not take his medicines. He notes that his breathing is much better. Weight is stable at home. His swelling has improved but not totally resolved. Some ankle pain. Has chronic hip pain and wanted to have THR in the future. No chest pain. Avoiding salt. Does not drink alcohol. Trying to get on Medicaid. Works part time in [place] and needs to return to work so he can buy his medicines. Some headache and he thought it was due to his medicine. He does not have a primary care doctor.   Feb. 12, 2015:  He is seen back for a visit. His BP has remained elevated. He was readmitted to the hospital with shortness of breath. He was aggressively diuresed but then developed gout. He had documented uric acid crystals seen on a joint aspiration.  He is not able to avoid the colchicine.  His blood pressure remains elevated.  Avoids salt - canned food, bacon, sausage.  May 08, 2013;  His BP has been ok at home. He is avoiding salt. He had a severe gouty flare up . He was prescribed colchicine but could not afford it. He is hoping that he can afford it now that he is on Medicaid.   Also has lots of hip pain. Has seen a chiropractor. He likely needs to have a hip replacement.   August 05, 2013: [name] [name] is doing OK. He saw [name] . He's been having some nausea for the past 3-4 weeks. He complains of having headaches with the BIDil - resolves with tylenol.  His edema has resolved   Dec. 17, 2015:  [name] [name] is a 64 yo with hx of CVA, chronic systolic CHF , HTN.  He went to the ER on Sunday / Monday . Was having orthopnea. He received IV lasix and urinated quite a bit. Now is breathing better.   He is having some orthostatic hypotension. He feels dizzy and his BP is typically very low during these times.  He has adjusted his medication schedule such that he will hold his meds if he needs to go out to appointments.    April 08, 2014:   [name] [name] is a 64 y.o. male who presents for follow up of his CHF He  has had some orthostatic hypotension.   Nov. 29, 2016:  Doing well.  Exercising at the senior center .  Breathing is ok.   Has lost some weight  BP has been low on occasion  Typically gets a little dizzy about 45 minutes after taking his meds - likely due to the BiDil.  Wakes up with a headache on occasion     Past Medical History  Diagnosis Date  . Hypertension   . Systolic heart failure (HCC)     EF is 35 to 40%  . Asthma     'as a child"   . Shortness of breath     "comes on at anytime" (01/18/2013)  . Sickle cell trait ([place])   . Stroke ([place]) 12/1988  . CHF (congestive heart failure) ([place])   . Blood clotting disorder (HCC)     pt. states his family has a history of "slow clotting", had bleeding after oral surgery age 68 but  none with extractions 2015 or adult surgeries; thought father (now deceased) had a hx of hemophilia  . History of gout   . Daily headache     are due to the medication he is taking  . Brain aneurysm 01/11/1989  . Full dentures   . Wears glasses   . Brain aneurysm   . Arthritis     "left hip; right knee" (10/09/2013)    Past Surgical History  Procedure Laterality Date  . 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 Cardiac catheterization  12/1988; 12/2012  . Multiple tooth extractions  spring 2015    "took out 7"  . Total hip arthroplasty Left 10/08/2013    anterior approach  . Gum surgery  1966  . Total hip arthroplasty Left 10/08/2013    Procedure: TOTAL HIP ARTHROPLASTY ANTERIOR APPROACH;  Surgeon: Renette Butters, MD;  Location: [place];  Service: Orthopedics;  Laterality: Left;  [name] Muscle biopsy N/A 12/06/2013    Procedure: FAT PAD BIOPSY;  Surgeon: Erroll Luna, MD;  Location: [place];  Service: General;  Laterality: N/A;  . Left and right heart catheterization with coronary angiogram N/A 01/21/2013    Procedure: LEFT AND RIGHT HEART CATHETERIZATION WITH CORONARY ANGIOGRAM;  Surgeon: Minus Breeding, MD;  Location: [facility] CATH LAB;  Service: Cardiovascular;  Laterality: N/A;  . Hernia repair  2004?    "above the navel"   . Colonoscopy with propofol N/A 09/04/2014    Procedure: COLONOSCOPY WITH PROPOFOL;  Surgeon: [name], MD;  Location: [facility];  Service: Endoscopy;  Laterality: N/A;     Current Outpatient Prescriptions  Medication Sig Dispense Refill  . Acetaminophen 500 MG TBDP Take 1,000 mg by mouth every 6 (six) hours as needed (for moderate pain).    [name] allopurinol (ZYLOPRIM) 100 MG tablet TAKE 2 TABLETS(200 MG) BY MOUTH DAILY 180 tablet 0  . atorvastatin (LIPITOR) 20 MG tablet TAKE 1 TABLET BY MOUTH DAILY 90 tablet 1  . carvedilol (COREG) 25 MG tablet TAKE 1 TABLET BY MOUTH TWICE DAILY WITH A MEAL 180 tablet 1  . furosemide (LASIX) 40 MG  tablet TAKE 1 1/2 TABLETS BY MOUTH TWICE DAILY 90 tablet 11  . isosorbide-hydrALAZINE (BIDIL) 20-37.5 MG tablet Take 2 tablets by mouth 3 (three) times daily.    [name] lisinopril (PRINIVIL,ZESTRIL) 20 MG tablet Take 1 tablet (20 mg total) by mouth daily. 90 tablet 3  . Magnesium 250 MG TABS Take 250 mg by mouth daily.    [name]  spironolactone (ALDACTONE) 25 MG tablet Take 1 tablet (25 mg total) by mouth daily. 90 tablet 3   No current facility-administered medications for this visit.    Allergies:   Hydrocodone and Hydrocodone-acetaminophen    Social History:  The patient  reports that he has never smoked. He has never used smokeless tobacco. He reports that he uses illicit drugs (Marijuana). He reports that he does not drink alcohol.   Family History:  The patient's family history includes Bleeding Disorder in his father; Cancer in his brother and father; Heart attack in his mother; Hyperlipidemia in his brother; Hypertension in his brother, father, mother, and sister; Varicose Veins in his mother.    ROS:  Please see the history of present illness.    Review of Systems: Constitutional:  denies fever, chills, diaphoresis, appetite change and fatigue.    HEENT: denies photophobia, eye pain, redness, hearing loss, ear pain, congestion, sore throat, rhinorrhea, sneezing, neck pain, neck stiffness and tinnitus.  Respiratory: denies SOB, DOE, cough, chest tightness, and wheezing.  Cardiovascular: denies chest pain, palpitations and leg swelling.  Has had some dizziness.   Gastrointestinal: denies nausea, vomiting, abdominal pain, diarrhea, constipation, blood in stool.  Genitourinary: denies dysuria, urgency, frequency, hematuria, flank pain and difficulty urinating.  Musculoskeletal: denies  myalgias, back pain, joint swelling, arthralgias and gait problem.   Skin: denies pallor, rash and wound.  Neurological: admits to dizziness, , light-headedness   Hematological: denies adenopathy, easy  bruising, personal or family bleeding history.  Psychiatric/ Behavioral: denies suicidal ideation, mood changes, confusion, nervousness, sleep disturbance and agitation.       All other systems are reviewed and negative.    PHYSICAL EXAM: VS:  BP 118/56 mmHg  Pulse 85  Ht 6\' 3"  (1.905 m)  Wt 263 lb (119.296 kg)  BMI 32.87 kg/m2  SpO2 93% , BMI Body mass index is 32.87 kg/(m^2). GEN: Well nourished, well developed, in no acute distress HEENT: normal Neck: no JVD, carotid bruits, or masses Cardiac: RRR; no murmurs, rubs, or gallops,no edema  Respiratory:  clear to auscultation bilaterally, normal work of breathing GI: soft, nontender, nondistended, + BS MS: no deformity or atrophy Skin: warm and dry, no rash Neuro:  Strength and sensation are intact Psych: normal   EKG:  EKG is not ordered today.    Recent Labs: 01/06/2014: Pro B Natriuretic peptide (BNP) 1817.0* 07/02/2014: ALT 14; Hemoglobin 13.6; Magnesium 1.9; Platelets 258 07/14/2014: BUN 21; Creat 1.29; Potassium 4.2; Sodium 142    Lipid Panel    Component Value Date/Time   CHOL 184 05/22/2013 1513   TRIG 87 05/22/2013 1513   HDL 42 05/22/2013 1513   CHOLHDL 4.4 05/22/2013 1513   VLDL 17 05/22/2013 1513   LDLCALC 125* 05/22/2013 1513      Wt Readings from Last 3 Encounters:  12/23/14 263 lb (119.296 kg)  10/10/14 260 lb (117.935 kg)  09/12/14 259 lb 8 oz (117.708 kg)      Other studies Reviewed: Additional studies/ records that were reviewed today include: . Review of the above records demonstrates:    ASSESSMENT AND PLAN:  1. Hypertension- improved, in fact, he is having some orthostatic hypotension currentlly. Continue same meds   2. Chronic systolic Congestive heart failure - EF 35-44%. His hardware symptoms are well controlled. We will need to decrease his lisinopril slightly because of some episodes of orthostatic hypotension. I'll see him again in 3 months for follow-up visit.  3. History  of stroke  4. Gout  Current medicines are reviewed at length with the patient today.  The patient does not have concerns regarding medicines.  The following changes have been made:  See above   Labs/ tests ordered today include:  No orders of the defined types were placed in this encounter.     Disposition:   FU with me in 3 months     Signed, Emily Massar, Wonda Cheng, MD  12/23/2014 3:31 PM    [facility] [address], [place], [place]  [address] Phone: [phone]; Fax: [phone]

## 2014-12-24 ENCOUNTER — Other Ambulatory Visit: Payer: Self-pay | Admitting: Internal Medicine

## 2015-01-14 ENCOUNTER — Encounter: Payer: Self-pay | Admitting: Internal Medicine

## 2015-01-14 ENCOUNTER — Ambulatory Visit (INDEPENDENT_AMBULATORY_CARE_PROVIDER_SITE_OTHER): Payer: Medicaid Other | Admitting: Internal Medicine

## 2015-01-14 VITALS — BP 85/49 | HR 68 | Temp 97.7°F | Ht 75.0 in | Wt 266.9 lb

## 2015-01-14 DIAGNOSIS — Z0000 Encounter for general adult medical examination without abnormal findings: Secondary | ICD-10-CM

## 2015-01-14 DIAGNOSIS — Z9989 Dependence on other enabling machines and devices: Secondary | ICD-10-CM

## 2015-01-14 DIAGNOSIS — I110 Hypertensive heart disease with heart failure: Secondary | ICD-10-CM

## 2015-01-14 DIAGNOSIS — E785 Hyperlipidemia, unspecified: Secondary | ICD-10-CM

## 2015-01-14 DIAGNOSIS — M1711 Unilateral primary osteoarthritis, right knee: Secondary | ICD-10-CM

## 2015-01-14 DIAGNOSIS — I5022 Chronic systolic (congestive) heart failure: Secondary | ICD-10-CM | POA: Diagnosis not present

## 2015-01-14 DIAGNOSIS — R0989 Other specified symptoms and signs involving the circulatory and respiratory systems: Secondary | ICD-10-CM

## 2015-01-14 DIAGNOSIS — M1A9XX0 Chronic gout, unspecified, without tophus (tophi): Secondary | ICD-10-CM

## 2015-01-14 DIAGNOSIS — IMO0001 Reserved for inherently not codable concepts without codable children: Secondary | ICD-10-CM

## 2015-01-14 DIAGNOSIS — I10 Essential (primary) hypertension: Secondary | ICD-10-CM

## 2015-01-14 DIAGNOSIS — G4733 Obstructive sleep apnea (adult) (pediatric): Secondary | ICD-10-CM

## 2015-01-14 DIAGNOSIS — Z96649 Presence of unspecified artificial hip joint: Secondary | ICD-10-CM | POA: Diagnosis not present

## 2015-01-14 DIAGNOSIS — Z5181 Encounter for therapeutic drug level monitoring: Secondary | ICD-10-CM

## 2015-01-14 DIAGNOSIS — M10379 Gout due to renal impairment, unspecified ankle and foot: Secondary | ICD-10-CM

## 2015-01-14 DIAGNOSIS — M159 Polyosteoarthritis, unspecified: Secondary | ICD-10-CM

## 2015-01-14 MED ORDER — ZOSTER VACCINE LIVE 19400 UNT/0.65ML ~~LOC~~ SOLR: 0.6500 mL | Freq: Once | SUBCUTANEOUS | Status: DC

## 2015-01-14 NOTE — Progress Notes (Signed)
Subjective:    Patient ID: [name] [name], male    DOB: 19-Mar-1950, 64 y.o.   MRN: [id number]  HPI Comments: [name] [name] is a 64 year old male with PMH as below here for follow-up of HTN and gout.  Please see problem based charting for status of his chronic conditions.  Exercising regularly at the senior center for ~ 30 minutes at least three times weekly.  Working - driving for [employer]  Past Medical History  Diagnosis Date  . Hypertension   . Systolic heart failure (HCC)     EF is 35 to 40%  . Asthma     'as a child"   . Shortness of breath     "comes on at anytime" (01/18/2013)  . Sickle cell trait ([place])   . Stroke ([place]) 12/1988  . CHF (congestive heart failure) ([place])   . Blood clotting disorder (HCC)     pt. states his family has a history of "slow clotting", had bleeding after oral surgery age 86 but none with extractions 2015 or adult surgeries; thought father (now deceased) had a hx of hemophilia  . History of gout   . Daily headache     are due to the medication he is taking  . Brain aneurysm 01/11/1989  . Full dentures   . Wears glasses   . Brain aneurysm   . Arthritis     "left hip; right knee" (10/09/2013)   Current Outpatient Prescriptions on File Prior to Visit  Medication Sig Dispense Refill  . Acetaminophen 500 MG TBDP Take 1,000 mg by mouth every 6 (six) hours as needed (for moderate pain).    [name] allopurinol (ZYLOPRIM) 100 MG tablet TAKE 2 TABLETS(200 MG) BY MOUTH DAILY 180 tablet 0  . atorvastatin (LIPITOR) 20 MG tablet TAKE 1 TABLET BY MOUTH DAILY 90 tablet 0  . carvedilol (COREG) 25 MG tablet TAKE 1 TABLET BY MOUTH TWICE DAILY WITH A MEAL 180 tablet 1  . furosemide (LASIX) 40 MG tablet TAKE 1 1/2 TABLETS BY MOUTH TWICE DAILY 90 tablet 11  . isosorbide-hydrALAZINE (BIDIL) 20-37.5 MG tablet Take 2 tablets by mouth 3 (three) times daily.    [name] lisinopril (PRINIVIL,ZESTRIL) 20 MG tablet Take 1 tablet (20 mg total) by mouth daily. 90 tablet 3  . Magnesium 250 MG  TABS Take 250 mg by mouth daily.    [name] spironolactone (ALDACTONE) 25 MG tablet Take 1 tablet (25 mg total) by mouth daily. 90 tablet 3   No current facility-administered medications on file prior to visit.    Review of Systems  Constitutional: Positive for chills. Negative for fever, appetite change and unexpected weight change.       One episode of chills last week.  HENT:       He is troubled by the buzz of new hearing aides.  He has been back to [facility] for help with this.   Respiratory: Negative for cough and shortness of breath.   Cardiovascular: Negative for chest pain, palpitations and leg swelling.  Gastrointestinal: Positive for blood in stool. Negative for nausea, vomiting, abdominal pain, diarrhea, constipation and abdominal distention.       An episode of drops of blood in stool one time four weeks ago. Has not recurred.   Endocrine: Negative for polyuria.  Genitourinary: Negative for dysuria and difficulty urinating.  Neurological: Positive for light-headedness and headaches. Negative for syncope and weakness.       AM headaches unchanged.  Psychiatric/Behavioral: Negative for dysphoric mood.  No exertional symptoms.      Filed Vitals:   01/14/15 1344 01/14/15 1357  BP: 125/68 85/49  Pulse: 68 68  Temp: 97.7 F (36.5 C)   TempSrc: Oral   Height: 6\' 3"  (1.905 m)   Weight: 266 lb 14.4 oz (121.065 kg)   SpO2: 97%     Objective:   Physical Exam  Constitutional: He is oriented to person, place, and time. He appears well-developed. No distress.  HENT:  Head: Normocephalic and atraumatic.  Mouth/Throat: Oropharynx is clear and moist. No oropharyngeal exudate.  Eyes: Conjunctivae and EOM are normal. Pupils are equal, round, and reactive to light. Right eye exhibits no discharge. Left eye exhibits no discharge. No scleral icterus.  Neck: Neck supple. No JVD present.  Cardiovascular: Normal rate, regular rhythm, normal heart sounds and intact distal pulses.  Exam reveals no  gallop and no friction rub.   No murmur heard. No carotid bruits.  Pulmonary/Chest: Effort normal and breath sounds normal. No respiratory distress. He has no wheezes. He has no rales.  Abdominal: Soft. Bowel sounds are normal. He exhibits no distension and no mass. There is no tenderness. There is no rebound and no guarding.  Musculoskeletal: Normal range of motion. He exhibits no edema or tenderness.  + right knee crepitance.  No swelling.  No pain with valgus/varus or flexion/extension.    Neurological: He is alert and oriented to person, place, and time. He displays normal reflexes. No cranial nerve deficit. He exhibits normal muscle tone.  5/5 MMS upper and lower extremities.  Sensation grossly intact.  Skin: Skin is warm. He is not diaphoretic.  Few small abrasions of left shin that look like he has scratched them.  Few small intact scabs on right shin.  No erythema, warmth or drainage.    Psychiatric: He has a normal mood and affect. His behavior is normal. Judgment and thought content normal.  Vitals reviewed.         Assessment & Plan:  Please see problem based charting for A&P.

## 2015-01-14 NOTE — Patient Instructions (Signed)
1. I will call you if there are problems with your labs.    2. Please take all medications as prescribed.    3. If you have worsening of your symptoms or new symptoms arise, please call the clinic [phone]), or go to the ER immediately if symptoms are severe.  Please return to see me in 6 months or sooner if you have problems.

## 2015-01-15 ENCOUNTER — Other Ambulatory Visit: Payer: Self-pay | Admitting: Internal Medicine

## 2015-01-15 LAB — CBC
HEMATOCRIT: 41.3 % (ref 37.5–51.0)
Hemoglobin: 13.9 g/dL (ref 12.6–17.7)
MCH: 32.6 pg (ref 26.6–33.0)
MCHC: 33.7 g/dL (ref 31.5–35.7)
MCV: 97 fL (ref 79–97)
Platelets: 229 10*3/uL (ref 150–379)
RBC: 4.27 x10E6/uL (ref 4.14–5.80)
RDW: 12.6 % (ref 12.3–15.4)
WBC: 8.3 10*3/uL (ref 3.4–10.8)

## 2015-01-15 LAB — CMP14 + ANION GAP
ALBUMIN: 3.7 g/dL (ref 3.6–4.8)
ALT: 14 IU/L (ref 0–44)
ANION GAP: 15 mmol/L (ref 10.0–18.0)
AST: 16 IU/L (ref 0–40)
Albumin/Globulin Ratio: 1.4 (ref 1.1–2.5)
Alkaline Phosphatase: 76 IU/L (ref 39–117)
BUN / CREAT RATIO: 17 (ref 10–22)
BUN: 22 mg/dL (ref 8–27)
Bilirubin Total: 0.7 mg/dL (ref 0.0–1.2)
CO2: 26 mmol/L (ref 18–29)
CREATININE: 1.33 mg/dL — AB (ref 0.76–1.27)
Calcium: 9.5 mg/dL (ref 8.6–10.2)
Chloride: 102 mmol/L (ref 96–106)
GFR, EST AFRICAN AMERICAN: 66 mL/min/{1.73_m2} (ref >59)
GFR, EST NON AFRICAN AMERICAN: 57 mL/min/{1.73_m2} — AB (ref >59)
Globulin, Total: 2.6 g/dL (ref 1.5–4.5)
Glucose: 107 mg/dL — ABNORMAL HIGH (ref 65–99)
Potassium: 4.7 mmol/L (ref 3.5–5.2)
Sodium: 143 mmol/L (ref 134–144)
TOTAL PROTEIN: 6.3 g/dL (ref 6.0–8.5)

## 2015-01-15 LAB — URIC ACID: Uric Acid: 5.5 mg/dL (ref 3.7–8.6)

## 2015-01-15 LAB — HEPATITIS C ANTIBODY: Hep C Virus Ab: <0.1 s/co ratio (ref 0.0–0.9)

## 2015-01-15 MED ORDER — ALLOPURINOL 100 MG PO TABS: ORAL_TABLET | ORAL | Status: DC

## 2015-01-15 MED ORDER — ATORVASTATIN CALCIUM 20 MG PO TABS: 20.0000 mg | ORAL_TABLET | Freq: Every day | ORAL | Status: DC

## 2015-01-15 NOTE — Assessment & Plan Note (Addendum)
Colonoscopy 09/04/14 Flu vaccine today. HCV check today (birth cohort [phone]). Given paper rx for shingles vaccine. Needs TDaP.  Will offer at next visit. Again advised to start ASA 81mg  daily for CVD prevention.

## 2015-01-15 NOTE — Assessment & Plan Note (Addendum)
Assessment:  Stable on current regimen.  He is asymptomatic from HF standpoint but still with occasional ADRs of headache and lightheadedness (likely largely due to Bidil). Prefers to adjust his routine to deal with ADRs as opposed to reducing med doses.  Follows with Dr. Acie Fredrickson and was recently seen three weeks ago. Plan:  Continue Coreg 25mg  BID (HR 68), Lasix 60mg  BID, Bidil 40-75mg  TID, lisinopril 20mg  daily, spironolactone 25mg  daily.  To follow-up with Dr. Acie Fredrickson in 30 months and return to see me in 6 months.

## 2015-01-15 NOTE — Assessment & Plan Note (Signed)
Assessment:  He has been re-evaluated by Dr. Bridgett Larsson with repeat carotid duplex (most recently 09/2014) and he reports no evidence of subclavian artery stenosis.  Dr. Bridgett Larsson has scheduled him for repeat duplex in 1 year. Plan:  Repeat duplex and follow-up with Dr. Bridgett Larsson as scheduled in Sept 2017.

## 2015-01-15 NOTE — Assessment & Plan Note (Addendum)
Assessment:  He reports hip replacement is doing well but he is having some pain in his right knee when climbing and occasional feels like it will give.  No swelling and ROM is normal on exam.  He does have significant crepitance in the right knee and says he was told years ago that he has "bone on bone" in that knee.  There is a scanned right knee xray from an outside facility in 2013 that was scanned into [facility].  The images are not very clear (it is scanned) but there does seem to be joint space narrowing.  He is requesting referral back to the surgeon who performed his hip replacement so he can have his knee evaluated for surgery. Plan:  Referral to ortho (Dr. Percell Miller).

## 2015-01-15 NOTE — Assessment & Plan Note (Signed)
Lipid Panel     Component Value Date/Time   CHOL 184 05/22/2013 1513   TRIG 87 05/22/2013 1513   HDL 42 05/22/2013 1513   CHOLHDL 4.4 05/22/2013 1513   VLDL 17 05/22/2013 1513   LDLCALC 125* 05/22/2013 1513   Assessment:  ASCVD risk is ~ 14% and moderate intensity statin is appropriate for this patient with hx of hemorrhagic (not ischemic) CVA and no CAD hx.  He is doing well on atorvastatin 20mg  daily and denies ADRs.  He has been hesitant to start or change medications in the past so I think it makes sense to continue this dose.  If, in the future his risk profile changes and he needs high intensity statin, it would be fine to increase if he was still tolerating well. Plan:  Check CMP today.  RTC in 6 months for follow-up.  Will plan to check lipids at that visit.

## 2015-01-15 NOTE — Assessment & Plan Note (Signed)
BP Readings from Last 3 Encounters:  01/14/15 85/49  12/23/14 118/56  10/10/14 115/91    Lab Results  Component Value Date   NA 143 01/14/2015   K 4.7 01/14/2015   CREATININE 1.33* 01/14/2015    Assessment: Blood pressure control:  well controlled at 125/68 Progress toward BP goal:   at goal Comments: Compliant with BP regimen.  Occasional OH symptoms and still having AM headaches (despite CPAP and trying decreased Bidil).  He resumed full AM dose of Bidil (two tabs in AM instead of one tabe in AM) about three months ago because he did not notice a reduction in headaches with lower AM dose.  He is happy with HF regimen and has learned to arrange his routine around the headaches.  He waits a few hours after his AM dose before driving or doing daily activities.  He is not having headaches at other times of the day.  He is working out ~ 30/day three or more times per week without problem.  Plan: Medications:  continue current medications:  Coreg 25mg  BID, Lasix 60mg  BID, Bidil 40-75mg  TID, lisinopril 20mg  daily, spironolactone 25mg  daily Educational resources provided:   Self management tools provided: home blood pressure logbook Other plans: Continue exercise regimen, weight control.  CMP today.  RTC in 6 months for follow-up.

## 2015-01-15 NOTE — Assessment & Plan Note (Signed)
Assessment:  Was compliant with CPAP but develop a rash across the bridge of his nose.  He took it back to the supplier and was given nasal prong CPAP mask to use instead.  He finds this more comfortable and the rash is resolving.  He feels his AM headaches are unchanged despite CPAP use and I think the headaches are likely ADR of Bidil. Plan:  Continue nasal prong CPAP mask.  Follow-up in 6 months.

## 2015-01-15 NOTE — Assessment & Plan Note (Signed)
Assessment:  Uric acid 5.8 six months ago and he was continued on maintenance dose of allopurinol 200mg  daily.  He is compliant with this med and has no ADRs. Plan:  Check uric acid today and then periodically (q 6-12 months).  Check CMP and CBC (on allopurinol).  Follow-up in 6 months.

## 2015-01-18 NOTE — Progress Notes (Signed)
Case discussed with Dr. Wilson soon after the resident saw the patient. We reviewed the resident's history and exam and pertinent patient test results. I agree with the assessment, diagnosis, and plan of care documented in the resident's note. 

## 2015-01-22 ENCOUNTER — Other Ambulatory Visit: Payer: Self-pay | Admitting: *Deleted

## 2015-01-22 MED ORDER — CARVEDILOL 25 MG PO TABS: ORAL_TABLET | ORAL | Status: DC

## 2015-03-27 ENCOUNTER — Other Ambulatory Visit: Payer: Self-pay

## 2015-03-27 MED ORDER — FUROSEMIDE 40 MG PO TABS: ORAL_TABLET | ORAL | Status: DC

## 2015-04-15 ENCOUNTER — Other Ambulatory Visit: Payer: Self-pay | Admitting: *Deleted

## 2015-04-15 ENCOUNTER — Telehealth: Payer: Self-pay | Admitting: Nurse Practitioner

## 2015-04-15 MED ORDER — LISINOPRIL 20 MG PO TABS: 20.0000 mg | ORAL_TABLET | Freq: Every day | ORAL | Status: DC

## 2015-04-15 NOTE — Telephone Encounter (Signed)
Left message on machine for pt to contact the office.   

## 2015-04-15 NOTE — Telephone Encounter (Signed)
New message      Pt is due to see Dr Acie Fredrickson soon.  everytimes he takes his medication, he gets a headache.  Please advise

## 2015-04-17 ENCOUNTER — Encounter (HOSPITAL_COMMUNITY): Payer: Self-pay

## 2015-04-17 ENCOUNTER — Emergency Department (HOSPITAL_COMMUNITY)
Admission: EM | Admit: 2015-04-17 | Discharge: 2015-04-17 | Disposition: A | Discharge status: Home / Self Care | Payer: BLUE CROSS/BLUE SHIELD | Attending: Emergency Medicine | Admitting: Emergency Medicine

## 2015-04-17 ENCOUNTER — Emergency Department (HOSPITAL_COMMUNITY): Payer: BLUE CROSS/BLUE SHIELD

## 2015-04-17 DIAGNOSIS — R519 Headache, unspecified: Secondary | ICD-10-CM

## 2015-04-17 DIAGNOSIS — Z79899 Other long term (current) drug therapy: Secondary | ICD-10-CM | POA: Insufficient documentation

## 2015-04-17 DIAGNOSIS — M1990 Unspecified osteoarthritis, unspecified site: Secondary | ICD-10-CM | POA: Diagnosis not present

## 2015-04-17 DIAGNOSIS — Z8673 Personal history of transient ischemic attack (TIA), and cerebral infarction without residual deficits: Secondary | ICD-10-CM | POA: Insufficient documentation

## 2015-04-17 DIAGNOSIS — I5020 Unspecified systolic (congestive) heart failure: Secondary | ICD-10-CM | POA: Diagnosis not present

## 2015-04-17 DIAGNOSIS — I10 Essential (primary) hypertension: Secondary | ICD-10-CM | POA: Diagnosis not present

## 2015-04-17 DIAGNOSIS — J45909 Unspecified asthma, uncomplicated: Secondary | ICD-10-CM | POA: Insufficient documentation

## 2015-04-17 DIAGNOSIS — Z862 Personal history of diseases of the blood and blood-forming organs and certain disorders involving the immune mechanism: Secondary | ICD-10-CM | POA: Diagnosis not present

## 2015-04-17 DIAGNOSIS — M109 Gout, unspecified: Secondary | ICD-10-CM | POA: Diagnosis not present

## 2015-04-17 DIAGNOSIS — R51 Headache: Secondary | ICD-10-CM | POA: Diagnosis present

## 2015-04-17 NOTE — Telephone Encounter (Signed)
Left message for patient to call back  

## 2015-04-17 NOTE — ED Notes (Signed)
Patient left at this time with all belongings. 

## 2015-04-17 NOTE — Discharge Instructions (Signed)
Mr. Sanders Kleinhans,  Nice meeting you! Please follow-up with your primary care provider as soon as possible. Return to the emergency department if you develop increased or different headaches, visual changes, or new symptoms. Feel better soon!  S. Wendie Simmer, PA-C   General Headache Without Cause A headache is pain or discomfort felt around the head or neck area. The specific cause of a headache may not be found. There are many causes and types of headaches. A few common ones are:  Tension headaches.  Migraine headaches.  Cluster headaches.  Chronic daily headaches. HOME CARE INSTRUCTIONS  Watch your condition for any changes. Take these steps to help with your condition: Managing Pain  Take over-the-counter and prescription medicines only as told by your health care provider.  Lie down in a dark, quiet room when you have a headache.  If directed, apply ice to the head and neck area:  Put ice in a plastic bag.  Place a towel between your skin and the bag.  Leave the ice on for 20 minutes, 2-3 times per day.  Use a heating pad or hot shower to apply heat to the head and neck area as told by your health care provider.  Keep lights dim if bright lights bother you or make your headaches worse. Eating and Drinking  Eat meals on a regular schedule.  Limit alcohol use.  Decrease the amount of caffeine you drink, or stop drinking caffeine. General Instructions  Keep all follow-up visits as told by your health care provider. This is important.  Keep a headache journal to help find out what may trigger your headaches. For example, write down:  What you eat and drink.  How much sleep you get.  Any change to your diet or medicines.  Try massage or other relaxation techniques.  Limit stress.  Sit up straight, and do not tense your muscles.  Do not use tobacco products, including cigarettes, chewing tobacco, or e-cigarettes. If you need help quitting, ask your health  care provider.  Exercise regularly as told by your health care provider.  Sleep on a regular schedule. Get 7-9 hours of sleep, or the amount recommended by your health care provider. SEEK MEDICAL CARE IF:   Your symptoms are not helped by medicine.  You have a headache that is different from the usual headache.  You have nausea or you vomit.  You have a fever. SEEK IMMEDIATE MEDICAL CARE IF:   Your headache becomes severe.  You have repeated vomiting.  You have a stiff neck.  You have a loss of vision.  You have problems with speech.  You have pain in the eye or ear.  You have muscular weakness or loss of muscle control.  You lose your balance or have trouble walking.  You feel faint or pass out.  You have confusion.   This information is not intended to replace advice given to you by your health care provider. Make sure you discuss any questions you have with your health care provider.   Document Released: 01/10/2005 Document Revised: 10/01/2014 Document Reviewed: 05/05/2014 Elsevier Interactive Patient Education [employer].

## 2015-04-17 NOTE — ED Notes (Signed)
Pt here with c/o right sided headache. He states he gets headaches everytime he takes Bidil but this headache, which started yesterday is more severe than normal. Hx of brain aneurysm. He took two 500mg  tylenol tablets PTA around 2300 with only minimal relief. Denies vision changes or numbness/tingling.

## 2015-04-17 NOTE — ED Provider Notes (Signed)
CSN: [id number]     Arrival date & time 04/17/15  0217 History   First MD Initiated Contact with Patient 04/17/15 [phone]     Chief Complaint  Patient presents with  . Headache   HPI   [name] [name] is a 65 y.o. male PMH significant for hypertension, systolic heart failure (EF 35-40%), stroke, brain aneurysm presenting with a one day history of worsening headache. He describes the headache as right sided, constant, 7/10 pain scale, achy, occurring only after taking BiDil. 1000 mg Tylenol provided minimal relief. He states this has been an ongoing issue but was concerned and wanted to be evaluated. He denies any other complaints.   Past Medical History  Diagnosis Date  . Hypertension   . Systolic heart failure (HCC)     EF is 35 to 40%  . Asthma     'as a child"   . Shortness of breath     "comes on at anytime" (01/18/2013)  . Sickle cell trait ([place])   . Stroke ([place]) 12/1988  . CHF (congestive heart failure) ([place])   . Blood clotting disorder (HCC)     pt. states his family has a history of "slow clotting", had bleeding after oral surgery age 40 but none with extractions 2015 or adult surgeries; thought father (now deceased) had a hx of hemophilia  . History of gout   . Daily headache     are due to the medication he is taking  . Brain aneurysm 01/11/1989  . Full dentures   . Wears glasses   . Brain aneurysm   . Arthritis     "left hip; right knee" (10/09/2013)   Past Surgical History  Procedure Laterality Date  . 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 Cardiac catheterization  12/1988; 12/2012  . Multiple tooth extractions  spring 2015    "took out 7"  . Total hip arthroplasty Left 10/08/2013    anterior approach  . Gum surgery  1966  . Total hip arthroplasty Left 10/08/2013    Procedure: TOTAL HIP ARTHROPLASTY ANTERIOR APPROACH;  Surgeon: Renette Butters, MD;  Location: [facility];  Service: Orthopedics;  Laterality: Left;  [name] Muscle biopsy N/A 12/06/2013    Procedure:  FAT PAD BIOPSY;  Surgeon: Erroll Luna, MD;  Location: [facility];  Service: General;  Laterality: N/A;  . Left and right heart catheterization with coronary angiogram N/A 01/21/2013    Procedure: LEFT AND RIGHT HEART CATHETERIZATION WITH CORONARY ANGIOGRAM;  Surgeon: Minus Breeding, MD;  Location: [facility] CATH LAB;  Service: Cardiovascular;  Laterality: N/A;  . Hernia repair  2004?    "above the navel"   . Colonoscopy with propofol N/A 09/04/2014    Procedure: COLONOSCOPY WITH PROPOFOL;  Surgeon: [name], MD;  Location: [facility];  Service: Endoscopy;  Laterality: N/A;   Family History  Problem Relation Age of Onset  . Heart attack Mother   . Hypertension Mother   . Varicose Veins Mother   . Cancer Father   . Hypertension Father   . Bleeding Disorder Father   . Hypertension Sister   . Cancer Brother   . Hypertension Brother   . Hyperlipidemia Brother    Social History  Substance Use Topics  . Smoking status: Never Smoker   . Smokeless tobacco: Never Used  . Alcohol Use: No    Review of Systems  Ten systems are reviewed and are negative for acute change except as noted  in the HPI  Allergies  Hydrocodone and Hydrocodone-acetaminophen  Home Medications   Prior to Admission medications   Medication Sig Start Date End Date Taking? Authorizing Provider  Acetaminophen 500 MG TBDP Take 1,000 mg by mouth every 6 (six) hours as needed (for moderate pain).   Yes Historical Provider, MD  allopurinol (ZYLOPRIM) 100 MG tablet TAKE 2 TABLETS(200 MG) BY MOUTH DAILY 01/15/15  Yes Francesca Oman, DO  atorvastatin (LIPITOR) 20 MG tablet Take 1 tablet (20 mg total) by mouth daily. 01/15/15  Yes Alex Ronnie Derby, DO  carvedilol (COREG) 25 MG tablet TAKE 1 TABLET BY MOUTH TWICE DAILY WITH A MEAL 01/22/15  Yes Thayer Headings, MD  furosemide (LASIX) 40 MG tablet Take 1.5 tablets (60 mg) by mouth twice a day 03/27/15  Yes Thayer Headings, MD  isosorbide-hydrALAZINE (BIDIL)  20-37.5 MG tablet Take 2 tablets by mouth 3 (three) times daily.   Yes Historical Provider, MD  lisinopril (PRINIVIL,ZESTRIL) 20 MG tablet Take 1 tablet (20 mg total) by mouth daily. 04/15/15  Yes Thayer Headings, MD  Magnesium 250 MG TABS Take 250 mg by mouth daily.   Yes Historical Provider, MD  spironolactone (ALDACTONE) 25 MG tablet Take 1 tablet (25 mg total) by mouth daily. 04/10/14  Yes Thayer Headings, MD  zoster vaccine live, PF, (ZOSTAVAX) 19147 UNT/0.65ML injection Inject 19,400 Units into the skin once. Patient not taking: Reported on 04/17/2015 01/14/15   Francesca Oman, DO   BP 155/106 mmHg  Pulse 64  Temp(Src) 97.5 F (36.4 C) (Oral)  Resp 20  Ht 6\' 4"  (1.93 m)  Wt 121.734 kg  BMI 32.68 kg/m2  SpO2 99% Physical Exam  Constitutional: He is oriented to person, place, and time. He appears well-developed and well-nourished. No distress.  HENT:  Head: Normocephalic and atraumatic.  Right Ear: External ear normal.  Left Ear: External ear normal.  Nose: Nose normal.  Mouth/Throat: Oropharynx is clear and moist. No oropharyngeal exudate.  BL TM without bulging, erythema  Eyes: Conjunctivae and EOM are normal. Pupils are equal, round, and reactive to light. Right eye exhibits no discharge. Left eye exhibits no discharge. No scleral icterus.  Neck: Normal range of motion. No tracheal deviation present.  Cardiovascular: Normal rate, regular rhythm, normal heart sounds and intact distal pulses.  Exam reveals no gallop and no friction rub.   No murmur heard. Pulmonary/Chest: Effort normal and breath sounds normal. No respiratory distress. He has no wheezes. He has no rales. He exhibits no tenderness.  Abdominal: Soft. Bowel sounds are normal. He exhibits no distension and no mass. There is no tenderness. There is no rebound and no guarding.  Musculoskeletal: Normal range of motion. He exhibits no edema or tenderness.  Lymphadenopathy:    He has no cervical adenopathy.  Neurological:  He is alert and oriented to person, place, and time. Coordination normal.  Cranial nerves 2-12 intact. Normal finger to nose, pronator drift, RAM.  Skin: Skin is warm and dry. No rash noted. He is not diaphoretic. No erythema.  Psychiatric: He has a normal mood and affect. His behavior is normal.  Nursing note and vitals reviewed.   ED Course  Procedures  Imaging Review Ct Head Wo Contrast  04/17/2015  CLINICAL DATA:  65 year old male with headache EXAM: CT HEAD WITHOUT CONTRAST TECHNIQUE: Contiguous axial images were obtained from the base of the skull through the vertex without intravenous contrast. COMPARISON:  None. FINDINGS: The ventricles and sulci are appropriate in  size for patient's age. Mild periventricular and deep white matter chronic microvascular ischemic changes noted. There is an area of hypodensity in the right frontal lobe in the region of the right MCA territory compatible with an old infarct and encephalomalacia. There is a 5 mm high attenuating segment of the M2 segment of the right MCA (series 2, image 12) which may be artifactual and related to prominence of the vessels on-end background of encephalomalacia. A M2 segment thrombus is not excluded. Correlation with clinical exam recommended. CT angiography may provide better evaluation if clinically indicated. There is no acute intrapulmonary hemorrhage. No mass effect or midline shift identified. The visualized paranasal sinuses and mastoid air cells are clear. The calvarium is intact. IMPRESSION: No acute intracranial hemorrhage. Large area of right MCA territory old infarct and encephalomalacia. A 5 mm segmental high attenuation in the M2 segment of the right MCA which may be artifactual. CT angiography may provide better evaluation if clinically indicated. Electronically Signed   By: Anner Crete M.D.   On: 04/17/2015 04:07   I have personally reviewed and evaluated these images and lab results as part of my medical  decision-making.  MDM   Final diagnoses:  Headache, unspecified headache type   Patient nontoxic appearing, VSS. CT head, ordered prior to my evaluation, unremarkable.  Most likely primary HA vs medication induced. Less likely hypertensive HA, venous sinus thrombosis, aneurysmal SAH, AVM, acute CVA, carotid dissection, ICH, SAH, SDH, EDH, postconcussive syndrome, meningitis, encephalitis, abscess, mass effect, hydrocephalus, pseudotumor cerebri, glaucoma, trigeminal neuralgia, sinusitis, TMJ syndrome, temporal arteritis, mastoiditis, carbon monoxide poisoning, rebound HA based on patient history and physical exam.   Patient's headache has resolved completely without treatment here in ED. This is most likely a medication issue.  Discussed case with Dr. Billy Fischer who agrees with above plan. Patient states his prescribing MD is aware of this issue, and that he will follow-up with him for medication adjustment. I encouraged him to take Tylenol 30 minutes prior to his BiDil to try and alleviate symptoms. Patient may be safely discharged home. Discussed reasons for return. Patient to follow-up with primary care provider within one week. Patient in understanding and agreement with the plan.  [place] Lions, PA-C 04/20/15 [place], MD 04/20/15 2141

## 2015-04-17 NOTE — ED Notes (Signed)
PA at bedside.

## 2015-04-29 ENCOUNTER — Encounter: Payer: Self-pay | Admitting: Cardiovascular Disease

## 2015-04-29 ENCOUNTER — Ambulatory Visit (INDEPENDENT_AMBULATORY_CARE_PROVIDER_SITE_OTHER): Payer: BLUE CROSS/BLUE SHIELD | Admitting: Cardiovascular Disease

## 2015-04-29 VITALS — BP 145/100 | HR 58 | Ht 76.0 in | Wt 266.8 lb

## 2015-04-29 DIAGNOSIS — I10 Essential (primary) hypertension: Secondary | ICD-10-CM

## 2015-04-29 DIAGNOSIS — I5022 Chronic systolic (congestive) heart failure: Secondary | ICD-10-CM | POA: Diagnosis not present

## 2015-04-29 DIAGNOSIS — E785 Hyperlipidemia, unspecified: Secondary | ICD-10-CM | POA: Diagnosis not present

## 2015-04-29 MED ORDER — HYDRALAZINE HCL 50 MG PO TABS: 50.0000 mg | ORAL_TABLET | Freq: Three times a day (TID) | ORAL | Status: DC

## 2015-04-29 NOTE — Progress Notes (Signed)
Cardiology Office Note   Date:  04/29/2015   ID:  [name] [name], DOB 25-Jun-1950, MRN [id number]  PCP:  Duwaine Maxin, DO  Cardiologist:   Acie Fredrickson Wonda Cheng, MD   Chief Complaint  Patient presents with  . Hypertension   1. Hypertension 2. Chronic systolic Congestive heart failure - EF 35-44% 3. History of stroke 4. Gout  History of Present Illness: [name] is seen back today for a post hospital/TOC visit. [name] He has had longstanding HTN, asthma, and prior CVA. Originally from the [place].   Most recently presented with increased SOB, DOE and orthopnea for 3 weeks - he was found to be hypertensive, using NSAID, etc. Had been taking some Lasix that he was prescribed while living in [place]. He was cathed - had mild CAD with elevated right sided pressures. Bidil added along with ACE. Renal function improved. EF was 35 to 40%.   Comes back today. Here alone. Medicines are all mixed up - on both OTC potassium and prescription and only taking the OTC agent. He is not taking Mobic (which was left on his discharge meds). Has not had his medicines today. His car had to be towed and so he did not eat and thus did not take his medicines. He notes that his breathing is much better. Weight is stable at home. His swelling has improved but not totally resolved. Some ankle pain. Has chronic hip pain and wanted to have THR in the future. No chest pain. Avoiding salt. Does not drink alcohol. Trying to get on Medicaid. Works part time in [place] and needs to return to work so he can buy his medicines. Some headache and he thought it was due to his medicine. He does not have a primary care doctor.   Feb. 12, 2015:  He is seen back for a visit. His BP has remained elevated. He was readmitted to the hospital with shortness of breath. He was aggressively diuresed but then developed gout. He had documented uric acid crystals seen on a joint aspiration.  He is not able to avoid the colchicine. His blood  pressure remains elevated.  Avoids salt - canned food, bacon, sausage.  May 08, 2013;  His BP has been ok at home. He is avoiding salt. He had a severe gouty flare up . He was prescribed colchicine but could not afford it. He is hoping that he can afford it now that he is on Medicaid.   Also has lots of hip pain. Has seen a chiropractor. He likely needs to have a hip replacement.   August 05, 2013: [name] [name] is doing OK. He saw [name] . He's been having some nausea for the past 3-4 weeks. He complains of having headaches with the BIDil - resolves with tylenol.  His edema has resolved   Dec. 17, 2015:  [name] [name] is a 65 yo with hx of CVA, chronic systolic CHF , HTN.  He went to the ER on Sunday / Monday . Was having orthopnea. He received IV lasix and urinated quite a bit. Now is breathing better.   He is having some orthostatic hypotension. He feels dizzy and his BP is typically very low during these times.  He has adjusted his medication schedule such that he will hold his meds if he needs to go out to appointments.    April 08, 2014:   [name] [name] is a 65 y.o. male who presents for follow up of his CHF He has had some orthostatic  hypotension.   Nov. 29, 2016:  Doing well.  Exercising at the senior center .  Breathing is ok.   Has lost some weight  BP has been low on occasion  Typically gets a little dizzy about 45 minutes after taking his meds - likely due to the BiDil.  Wakes up with a headache on occasion    April 29, 2015: Seen for follow up today  Did not take his Bidil this am. It causes such a headache for 3-4 hours and he is not able to do anything during that time .  Also gets dizzy.    Has been taking the Bidil only 1 tablet TID  BP is typically well controlled when he takes the medication     Past Medical History  Diagnosis Date  . Hypertension   . Systolic heart failure (HCC)     EF is 35 to 40%  . Asthma     'as a child"   .  Shortness of breath     "comes on at anytime" (01/18/2013)  . Sickle cell trait ([place])   . Stroke ([place]) 12/1988  . CHF (congestive heart failure) ([place])   . Blood clotting disorder (HCC)     pt. states his family has a history of "slow clotting", had bleeding after oral surgery age 25 but none with extractions 2015 or adult surgeries; thought father (now deceased) had a hx of hemophilia  . History of gout   . Daily headache     are due to the medication he is taking  . Brain aneurysm 01/11/1989  . Full dentures   . Wears glasses   . Brain aneurysm   . Arthritis     "left hip; right knee" (10/09/2013)    Past Surgical History  Procedure Laterality Date  . 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 Cardiac catheterization  12/1988; 12/2012  . Multiple tooth extractions  spring 2015    "took out 7"  . Total hip arthroplasty Left 10/08/2013    anterior approach  . Gum surgery  1966  . Total hip arthroplasty Left 10/08/2013    Procedure: TOTAL HIP ARTHROPLASTY ANTERIOR APPROACH;  Surgeon: Renette Butters, MD;  Location: [facility];  Service: Orthopedics;  Laterality: Left;  [name] Muscle biopsy N/A 12/06/2013    Procedure: FAT PAD BIOPSY;  Surgeon: Erroll Luna, MD;  Location: [facility];  Service: General;  Laterality: N/A;  . Left and right heart catheterization with coronary angiogram N/A 01/21/2013    Procedure: LEFT AND RIGHT HEART CATHETERIZATION WITH CORONARY ANGIOGRAM;  Surgeon: Minus Breeding, MD;  Location: [facility] CATH LAB;  Service: Cardiovascular;  Laterality: N/A;  . Hernia repair  2004?    "above the navel"   . Colonoscopy with propofol N/A 09/04/2014    Procedure: COLONOSCOPY WITH PROPOFOL;  Surgeon: [name], MD;  Location: [facility];  Service: Endoscopy;  Laterality: N/A;     Current Outpatient Prescriptions  Medication Sig Dispense Refill  . Acetaminophen 500 MG TBDP Take 1,000 mg by mouth every 6 (six) hours as needed (for moderate pain).    [name]  allopurinol (ZYLOPRIM) 100 MG tablet TAKE 2 TABLETS(200 MG) BY MOUTH DAILY 180 tablet 1  . atorvastatin (LIPITOR) 20 MG tablet Take 1 tablet (20 mg total) by mouth daily. 90 tablet 1  . carvedilol (COREG) 25 MG tablet TAKE 1 TABLET BY MOUTH TWICE DAILY WITH A MEAL 180 tablet 3  . furosemide (  LASIX) 40 MG tablet Take 1.5 tablets (60 mg) by mouth twice a day 90 tablet 7  . isosorbide-hydrALAZINE (BIDIL) 20-37.5 MG tablet Take 2 tablets by mouth 3 (three) times daily.    [name] lisinopril (PRINIVIL,ZESTRIL) 20 MG tablet Take 1 tablet (20 mg total) by mouth daily. 30 tablet 6  . Magnesium 250 MG TABS Take 250 mg by mouth daily.    [name] spironolactone (ALDACTONE) 25 MG tablet Take 1 tablet (25 mg total) by mouth daily. 90 tablet 3   No current facility-administered medications for this visit.    Allergies:   Hydrocodone and Hydrocodone-acetaminophen    Social History:  The patient  reports that he has never smoked. He has never used smokeless tobacco. He reports that he uses illicit drugs (Marijuana). He reports that he does not drink alcohol.   Family History:  The patient's family history includes Bleeding Disorder in his father; Cancer in his brother and father; Heart attack in his mother; Hyperlipidemia in his brother; Hypertension in his brother, father, mother, and sister; Varicose Veins in his mother.    ROS:  Please see the history of present illness.    Review of Systems: Constitutional:  denies fever, chills, diaphoresis, appetite change and fatigue.    HEENT: denies photophobia, eye pain, redness, hearing loss, ear pain, congestion, sore throat, rhinorrhea, sneezing, neck pain, neck stiffness and tinnitus.  Respiratory: denies SOB, DOE, cough, chest tightness, and wheezing.  Cardiovascular: denies chest pain, palpitations and leg swelling.  Has had some dizziness.   Gastrointestinal: denies nausea, vomiting, abdominal pain, diarrhea, constipation, blood in stool.  Genitourinary: denies  dysuria, urgency, frequency, hematuria, flank pain and difficulty urinating.  Musculoskeletal: denies  myalgias, back pain, joint swelling, arthralgias and gait problem.   Skin: denies pallor, rash and wound.  Neurological: admits to dizziness, , light-headedness   Hematological: denies adenopathy, easy bruising, personal or family bleeding history.  Psychiatric/ Behavioral: denies suicidal ideation, mood changes, confusion, nervousness, sleep disturbance and agitation.       All other systems are reviewed and negative.    PHYSICAL EXAM: VS:  BP 145/100 mmHg  Pulse 58  Ht 6\' 4"  (1.93 m)  Wt 266 lb 12.8 oz (121.02 kg)  BMI 32.49 kg/m2 , BMI Body mass index is 32.49 kg/(m^2). GEN: Well nourished, well developed, in no acute distress HEENT: normal Neck: no JVD, carotid bruits, or masses Cardiac: RRR; no murmurs, rubs, or gallops,no edema  Respiratory:  clear to auscultation bilaterally, normal work of breathing GI: soft, nontender, nondistended, + BS MS: no deformity or atrophy Skin: warm and dry, no rash Neuro:  Strength and sensation are intact Psych: normal   EKG:  EKG is ordered today. Sinus brady with PACs.   HR 57.   TWI in V5-V6.     Recent Labs: 07/02/2014: Hemoglobin 13.6; Magnesium 1.9 01/14/2015: ALT 14; BUN 22; Creatinine, Ser 1.33*; Platelets 229; Potassium 4.7; Sodium 143    Lipid Panel    Component Value Date/Time   CHOL 184 05/22/2013 1513   TRIG 87 05/22/2013 1513   HDL 42 05/22/2013 1513   CHOLHDL 4.4 05/22/2013 1513   VLDL 17 05/22/2013 1513   LDLCALC 125* 05/22/2013 1513      Wt Readings from Last 3 Encounters:  04/29/15 266 lb 12.8 oz (121.02 kg)  04/17/15 268 lb 6 oz (121.734 kg)  01/14/15 266 lb 14.4 oz (121.065 kg)      Other studies Reviewed: Additional studies/ records that were reviewed today include: .  Review of the above records demonstrates:    ASSESSMENT AND PLAN:  1. Hypertension-   BP is elevated today  - did not take  Bidil today   Has severe headaches and orthostasis with bidil Will DC bidil Start Hydralazine 50 TID   2. Chronic systolic Congestive heart failure - EF 35-44%. His hardware symptoms are well controlled. We will need to decrease his lisinopril slightly because of some episodes of orthostatic hypotension. I'll see him again in 3 months for follow-up visit. Will see him back in 3 months  - may assess LV function at that time   3. History of stroke  4. Gout   Current medicines are reviewed at length with the patient today.  The patient does not have concerns regarding medicines.  The following changes have been made:  See above   Labs/ tests ordered today include:  No orders of the defined types were placed in this encounter.     Disposition:   FU with me in 3 months     Signed, Martia Dalby, Wonda Cheng, MD  04/29/2015 8:52 AM    [facility] [address], [place], [place]  [address] Phone: [phone]; Fax: [phone]

## 2015-04-29 NOTE — Patient Instructions (Addendum)
Medication Instructions:  STOP Bidil START Hydralazine 50 mg three times per week   Labwork: Your physician recommends that you return for lab work (basic metabolic panel) in: 3 months on the same day as appointment with Dr. Acie Fredrickson   Testing/Procedures: None Ordered   Follow-Up: Your physician recommends that you schedule a follow-up appointment in: 1 month with Nurse for BP check   Your physician recommends that you schedule a follow-up appointment in: 3 months with Dr. Acie Fredrickson   If you need a refill on your cardiac medications before your next appointment, please call your pharmacy.   Thank you for choosing [facility]! Christen Bame, RN [phone]

## 2015-05-12 IMAGING — CR DG CHEST 1V PORT
1 series · 1 of 1 positions shown · non-contrast
Comparison: None available for comparison at time of study
interpretation.

CLINICAL DATA: Shortness of breath, history of hypertension, stroke
and asthma.

EXAM:
PORTABLE CHEST - 1 VIEW

[AP]
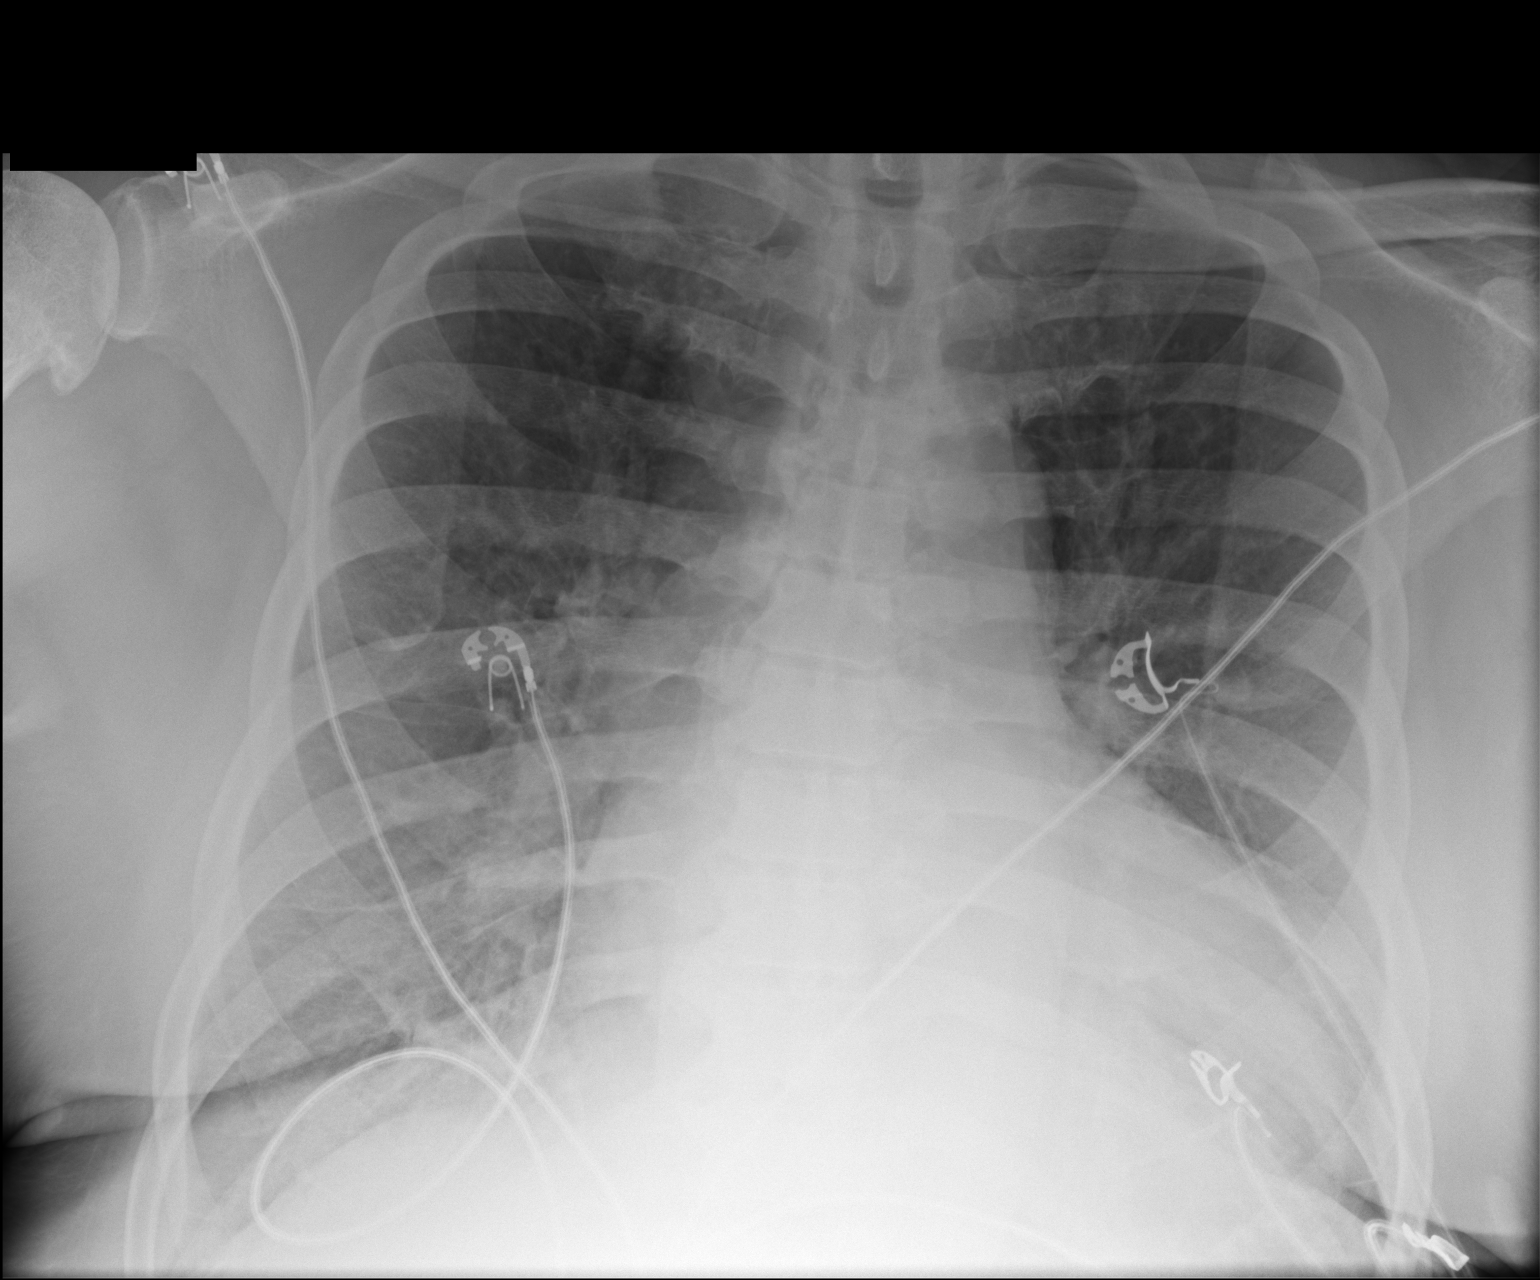

[1 of 1 positions shown; findings below may reference images not displayed]

FINDINGS: The cardiac silhouette appears mild to moderately enlarged.
Mediastinal silhouette is unremarkable. Linear densities in right
lung base. No pleural effusions or focal consolidations. No
pneumothorax.

Soft tissue planes and included osseous structures are
nonsuspicious. Moderate degenerative change of the right shoulder.
Multiple EKG lines overlie the patient and may obscure subtle
underlying pathology.
IMPRESSION: Cardiomegaly ; right lower lobe atelectasis versus scarring.

  By: Ward Tessema

## 2015-06-10 ENCOUNTER — Ambulatory Visit (INDEPENDENT_AMBULATORY_CARE_PROVIDER_SITE_OTHER): Payer: BLUE CROSS/BLUE SHIELD | Admitting: *Deleted

## 2015-06-10 VITALS — BP 134/82 | HR 75 | Ht 76.0 in | Wt 261.0 lb

## 2015-06-10 DIAGNOSIS — I10 Essential (primary) hypertension: Secondary | ICD-10-CM

## 2015-06-10 NOTE — Progress Notes (Signed)
1.) Reason for visit: BP Check  2.) Name of MD requesting visit: Dr.Nahser  3.) ROS related to problem: Pt in today for BP Check after starting Hydralazine 50mg  TID and stopping Bidil.  Pt states that HA has much improved since medication change.  Pt denies CP, SOB, dizziness or lightheadedness.  Pt states that he does monitor his BP at home and readings are much improved.  Pt had a few readings from April, shortly after medication change: 140/70, 130/60, 141/79, 133/73 and 111/59.  Pt did not have any BP's from May but states they have been good.  Vitals from today: Left arm 134/82, Right arm 128/80 and HR 75.  4.) Assessment and plan per MD: Spoke with Dr. Acie Fredrickson and he stated no changes, continue current medications at this time.  Advised pt to continue monitoring BP's and call us if continuously elevated, otherwise update [place] at next appt in July.

## 2015-07-08 ENCOUNTER — Encounter: Payer: Self-pay | Admitting: *Deleted

## 2015-07-15 ENCOUNTER — Other Ambulatory Visit: Payer: Self-pay | Admitting: *Deleted

## 2015-07-15 ENCOUNTER — Encounter: Payer: Self-pay | Admitting: Internal Medicine

## 2015-07-15 ENCOUNTER — Ambulatory Visit (INDEPENDENT_AMBULATORY_CARE_PROVIDER_SITE_OTHER): Payer: BLUE CROSS/BLUE SHIELD | Admitting: Internal Medicine

## 2015-07-15 VITALS — BP 139/65 | HR 70 | Temp 97.9°F | Wt 264.2 lb

## 2015-07-15 DIAGNOSIS — E785 Hyperlipidemia, unspecified: Secondary | ICD-10-CM

## 2015-07-15 DIAGNOSIS — M10379 Gout due to renal impairment, unspecified ankle and foot: Secondary | ICD-10-CM

## 2015-07-15 DIAGNOSIS — Z79899 Other long term (current) drug therapy: Secondary | ICD-10-CM

## 2015-07-15 DIAGNOSIS — I10 Essential (primary) hypertension: Secondary | ICD-10-CM

## 2015-07-15 DIAGNOSIS — I130 Hypertensive heart and chronic kidney disease with heart failure and stage 1 through stage 4 chronic kidney disease, or unspecified chronic kidney disease: Secondary | ICD-10-CM | POA: Diagnosis not present

## 2015-07-15 DIAGNOSIS — M109 Gout, unspecified: Secondary | ICD-10-CM | POA: Diagnosis not present

## 2015-07-15 DIAGNOSIS — I5022 Chronic systolic (congestive) heart failure: Secondary | ICD-10-CM | POA: Diagnosis not present

## 2015-07-15 DIAGNOSIS — N182 Chronic kidney disease, stage 2 (mild): Secondary | ICD-10-CM | POA: Diagnosis not present

## 2015-07-15 DIAGNOSIS — M6530 Trigger finger, unspecified finger: Secondary | ICD-10-CM | POA: Insufficient documentation

## 2015-07-15 DIAGNOSIS — G4733 Obstructive sleep apnea (adult) (pediatric): Secondary | ICD-10-CM

## 2015-07-15 DIAGNOSIS — M65342 Trigger finger, left ring finger: Secondary | ICD-10-CM

## 2015-07-15 MED ORDER — ATORVASTATIN CALCIUM 20 MG PO TABS: 20.0000 mg | ORAL_TABLET | Freq: Every day | ORAL | Status: DC

## 2015-07-15 MED ORDER — SPIRONOLACTONE 25 MG PO TABS: 25.0000 mg | ORAL_TABLET | Freq: Every day | ORAL | Status: DC

## 2015-07-15 NOTE — Patient Instructions (Signed)
1. I will call you if there are problems with your labs.  It has been a pleasure taking care of you.  Please return to see [place] for follow-up in 6 months or sooner if you have problems.   2. Please take all medications as prescribed.    3. If you have worsening of your symptoms or new symptoms arise, please call the clinic [phone]), or go to the ER immediately if symptoms are severe.

## 2015-07-15 NOTE — Assessment & Plan Note (Signed)
Lipid Panel     Component Value Date/Time   CHOL 184 05/22/2013 1513   TRIG 87 05/22/2013 1513   HDL 42 05/22/2013 1513   CHOLHDL 4.4 05/22/2013 1513   VLDL 17 05/22/2013 1513   LDLCALC 125* 05/22/2013 1513    Assessment:  Compliant with atorvastatin.  He is not fasting today; he had curry meat for lunch prior to today's visit. Plan:  Continue Lipitor 20mg  daily.  Check hepatic function today.  Check FLP next visit.

## 2015-07-15 NOTE — Progress Notes (Signed)
Subjective:    Patient ID: [name] [name], male    DOB: January 09, 1951, 65 y.o.   MRN: [id number]  HPI Comments: [name] [name] is a 65 year old man with PMH as below here for follow-up of HTN.  Please see problem based charting for the status of this and other chronic conditions.    Past Medical History  Diagnosis Date  . Hypertension   . Systolic heart failure (HCC)     EF is 35 to 40%  . Asthma     'as a child"   . Shortness of breath     "comes on at anytime" (01/18/2013)  . Sickle cell trait ([place])   . Stroke ([place]) 12/1988  . CHF (congestive heart failure) ([place])   . Blood clotting disorder (HCC)     pt. states his family has a history of "slow clotting", had bleeding after oral surgery age 40 but none with extractions 2015 or adult surgeries; thought father (now deceased) had a hx of hemophilia  . History of gout   . Daily headache     are due to the medication he is taking  . Brain aneurysm 01/11/1989  . Full dentures   . Wears glasses   . Brain aneurysm   . Arthritis     "left hip; right knee" (10/09/2013)   Current Outpatient Prescriptions on File Prior to Visit  Medication Sig Dispense Refill  . Acetaminophen 500 MG TBDP Take 1,000 mg by mouth every 6 (six) hours as needed (for moderate pain).    [name] allopurinol (ZYLOPRIM) 100 MG tablet TAKE 2 TABLETS(200 MG) BY MOUTH DAILY 180 tablet 1  . atorvastatin (LIPITOR) 20 MG tablet Take 1 tablet (20 mg total) by mouth daily. 90 tablet 1  . carvedilol (COREG) 25 MG tablet TAKE 1 TABLET BY MOUTH TWICE DAILY WITH A MEAL 180 tablet 3  . furosemide (LASIX) 40 MG tablet Take 1.5 tablets (60 mg) by mouth twice a day 90 tablet 7  . hydrALAZINE (APRESOLINE) 50 MG tablet Take 1 tablet (50 mg total) by mouth 3 (three) times daily. 90 tablet 11  . lisinopril (PRINIVIL,ZESTRIL) 20 MG tablet Take 1 tablet (20 mg total) by mouth daily. 30 tablet 6  . Magnesium 250 MG TABS Take 250 mg by mouth daily.    [name] spironolactone (ALDACTONE) 25 MG tablet  Take 1 tablet (25 mg total) by mouth daily. 90 tablet 3   No current facility-administered medications on file prior to visit.    Review of Systems  Constitutional: Negative for fever, chills and appetite change.  Eyes: Negative for visual disturbance.  Respiratory: Negative for cough and shortness of breath.   Cardiovascular: Negative for chest pain, palpitations and leg swelling.  Gastrointestinal: Negative for nausea, vomiting, abdominal pain, diarrhea, constipation and blood in stool.  Genitourinary: Negative for dysuria, hematuria and difficulty urinating.  Neurological: Negative for syncope, light-headedness and headaches.       Filed Vitals:   07/15/15 1405  BP: 139/65  Pulse: 70  Temp: 97.9 F (36.6 C)  TempSrc: Oral  Weight: 264 lb 3.2 oz (119.84 kg)  SpO2: 98%     Objective:   Physical Exam  Constitutional: He is oriented to person, place, and time. He appears well-developed. No distress.  HENT:  Head: Normocephalic and atraumatic.  Mouth/Throat: Oropharynx is clear and moist. No oropharyngeal exudate.  Eyes: Conjunctivae and EOM are normal. Pupils are equal, round, and reactive to light. No scleral icterus.  Neck: Neck supple.  Cardiovascular: Normal rate, regular rhythm and normal heart sounds.  Exam reveals no gallop and no friction rub.   No murmur heard. Pulmonary/Chest: Effort normal and breath sounds normal. No respiratory distress. He has no wheezes. He has no rales.  Abdominal: Soft. Bowel sounds are normal. He exhibits no distension. There is no tenderness. There is no rebound and no guarding.  Musculoskeletal: Normal range of motion. He exhibits no edema or tenderness.  Neurological: He is alert and oriented to person, place, and time. No cranial nerve deficit.  Skin: Skin is warm and dry. He is not diaphoretic.  Psychiatric: He has a normal mood and affect. His behavior is normal. Judgment and thought content normal.  Vitals reviewed.           Assessment & Plan:  Please see problem based charting for A&P.

## 2015-07-15 NOTE — Assessment & Plan Note (Addendum)
Assessment: Asymptomatic. Headaches completely resolved off BiDil.  No longer lightheaded.  He asks about stopping lisinopril today.  He said he read about someone who developed angioedema and he is concerned about ADR of this med.  He thinks he had facial swelling three years ago but none in the past three years and continues on lisinopril without problem (so I do not think he had angioedema related to ACEI).   He is currently tolerating his meds without ADR.  Plan:  Discussed weighing cost vs benefit for the trx we use.  Educated on mortality benefit of ACEI in patients with HFrEF and how this benefit likely outweighs risk of ADRs.  He agrees to continue lisinopril for now.  Continue Coreg 25mg  BID, Lasix 60mg  BID, hydral 50mg  TID, lisinopril 20mg  daily, spironolactone 25mg  daily.  Follow-up with Dr. Acie Fredrickson next month as previously scheduled.

## 2015-07-15 NOTE — Assessment & Plan Note (Addendum)
BP Readings from Last 3 Encounters:  07/15/15 139/65  06/10/15 134/82  04/29/15 145/100    Lab Results  Component Value Date   NA 143 01/14/2015   K 4.7 01/14/2015   CREATININE 1.33* 01/14/2015    Assessment: Blood pressure control:  well controlled Progress toward BP goal:   at goal Comments: Compliant with Coreg 25mg  BID, Lasix 60mg  BID, hydral 50mg  TID, lisinopril 20mg  daily, spironolactone 25mg  daily.  Headaches completely resolved off BiDil.  Plan: Medications:  continue current medications:  Coreg 25mg  BID, Lasix 60mg  BID, hydral 50mg  TID, lisinopril 20mg  daily, spironolactone 25mg  daily Educational resources provided: brochure (denies at this time) Self management tools provided:   Other plans: RTC in 6 months for follow-up.

## 2015-07-15 NOTE — Assessment & Plan Note (Signed)
Assessment:  No recent gout attacks.  Compliant with allopurinol 200mg  daily. Plan:  Check uric acid level, CMP and CBC today.  Adjust allopurinol if needed for renal fxn or uric acid.  Otherwise, continue current dose.

## 2015-07-15 NOTE — Assessment & Plan Note (Signed)
Assessment:  He says he has the nasal appliance with has not been using the CPAP, says he is sleeping fine.  Plan:  Educated on relationship of OSA to HF and importance of CPAP compliance.  He agrees to resume use.

## 2015-07-15 NOTE — Assessment & Plan Note (Signed)
Assessment:  Reports occasional locking of left 4th finger.  He feels a know at the base of the fourth finger that is occasionally painful.  He says this has been going on for years.  He has not tried splinting. Plan:  He will check med supply store for splint; avoid activities that aggravate or cause locking; no NSAIDs given CKD2; return for steroid injection if no improvement.

## 2015-07-16 LAB — CMP14 + ANION GAP
A/G RATIO: 1.3 (ref 1.2–2.2)
ALK PHOS: 83 IU/L (ref 39–117)
ALT: 24 IU/L (ref 0–44)
ANION GAP: 14 mmol/L (ref 10.0–18.0)
AST: 19 IU/L (ref 0–40)
Albumin: 3.8 g/dL (ref 3.6–4.8)
BUN/Creatinine Ratio: 16 (ref 10–24)
BUN: 23 mg/dL (ref 8–27)
Bilirubin Total: 0.7 mg/dL (ref 0.0–1.2)
CALCIUM: 9.6 mg/dL (ref 8.6–10.2)
CO2: 27 mmol/L (ref 18–29)
CREATININE: 1.4 mg/dL — AB (ref 0.76–1.27)
Chloride: 100 mmol/L (ref 96–106)
GFR calc Af Amer: 61 mL/min/{1.73_m2} (ref >59)
GFR, EST NON AFRICAN AMERICAN: 53 mL/min/{1.73_m2} — AB (ref >59)
GLOBULIN, TOTAL: 3 g/dL (ref 1.5–4.5)
GLUCOSE: 105 mg/dL — AB (ref 65–99)
POTASSIUM: 4.5 mmol/L (ref 3.5–5.2)
SODIUM: 141 mmol/L (ref 134–144)
Total Protein: 6.8 g/dL (ref 6.0–8.5)

## 2015-07-16 LAB — CBC
Hematocrit: 43 % (ref 37.5–51.0)
Hemoglobin: 14.1 g/dL (ref 12.6–17.7)
MCH: 31.5 pg (ref 26.6–33.0)
MCHC: 32.8 g/dL (ref 31.5–35.7)
MCV: 96 fL (ref 79–97)
PLATELETS: 274 10*3/uL (ref 150–379)
RBC: 4.47 x10E6/uL (ref 4.14–5.80)
RDW: 13.2 % (ref 12.3–15.4)
WBC: 11.7 10*3/uL — AB (ref 3.4–10.8)

## 2015-07-16 LAB — URIC ACID: URIC ACID: 5.8 mg/dL (ref 3.7–8.6)

## 2015-07-17 NOTE — Progress Notes (Signed)
Internal Medicine Clinic Attending  Case discussed with Dr. Wilson soon after the resident saw the patient.  We reviewed the resident's history and exam and pertinent patient test results.  I agree with the assessment, diagnosis, and plan of care documented in the resident's note.  

## 2015-07-30 ENCOUNTER — Ambulatory Visit (INDEPENDENT_AMBULATORY_CARE_PROVIDER_SITE_OTHER): Payer: BLUE CROSS/BLUE SHIELD | Admitting: Cardiovascular Disease

## 2015-07-30 ENCOUNTER — Encounter: Payer: Self-pay | Admitting: Cardiovascular Disease

## 2015-07-30 VITALS — BP 114/66 | HR 66 | Ht 76.0 in | Wt 262.4 lb

## 2015-07-30 DIAGNOSIS — I10 Essential (primary) hypertension: Secondary | ICD-10-CM

## 2015-07-30 DIAGNOSIS — E785 Hyperlipidemia, unspecified: Secondary | ICD-10-CM

## 2015-07-30 DIAGNOSIS — I5022 Chronic systolic (congestive) heart failure: Secondary | ICD-10-CM | POA: Diagnosis not present

## 2015-07-30 LAB — LIPID PANEL
CHOL/HDL RATIO: 3.3 ratio (ref <=5.0)
CHOLESTEROL: 136 mg/dL (ref 125–200)
HDL: 41 mg/dL (ref >=40)
LDL Cholesterol: 76 mg/dL (ref <130)
Triglycerides: 97 mg/dL (ref <150)
VLDL: 19 mg/dL (ref <30)

## 2015-07-30 NOTE — Patient Instructions (Signed)
Medication Instructions:  Your physician recommends that you continue on your current medications as directed. Please refer to the Current Medication list given to you today.   Labwork: Today - cholesterol  Your physician recommends that you return for lab work in: 6 months on the day of or a few days before your office visit with Dr. Acie Fredrickson.  You will need to FAST for this appointment - nothing to eat or drink after midnight the night before except water.   Testing/Procedures: None Ordered   Follow-Up: Your physician wants you to follow-up in: 6 months with Dr. Acie Fredrickson.  You will receive a reminder letter in the mail two months in advance. If you don't receive a letter, please call our office to schedule the follow-up appointment.   If you need a refill on your cardiac medications before your next appointment, please call your pharmacy.   Thank you for choosing [facility]! Christen Bame, RN [phone]

## 2015-07-30 NOTE — Progress Notes (Signed)
Cardiology Office Note   Date:  07/30/2015   ID:  [name] [name], DOB 06-21-1950, MRN [id number]  PCP:  Noah Delaine, MD  Cardiologist:   Mertie Moores, MD   No chief complaint on file.  1. Hypertension 2. Chronic systolic Congestive heart failure - EF 35-44% 3. History of stroke 4. Gout  History of Present Illness: [name] [name] is seen back today for a post hospital/TOC visit. [name] [name] He has had longstanding HTN, asthma, and prior CVA. Originally from the [place].   Most recently presented with increased SOB, DOE and orthopnea for 3 weeks - he was found to be hypertensive, using NSAID, etc. Had been taking some Lasix that he was prescribed while living in [place]. He was cathed - had mild CAD with elevated right sided pressures. Bidil added along with ACE. Renal function improved. EF was 35 to 40%.   Comes back today. Here alone. Medicines are all mixed up - on both OTC potassium and prescription and only taking the OTC agent. He is not taking Mobic (which was left on his discharge meds). Has not had his medicines today. His car had to be towed and so he did not eat and thus did not take his medicines. He notes that his breathing is much better. Weight is stable at home. His swelling has improved but not totally resolved. Some ankle pain. Has chronic hip pain and wanted to have THR in the future. No chest pain. Avoiding salt. Does not drink alcohol. Trying to get on Medicaid. Works part time in [place] and needs to return to work so he can buy his medicines. Some headache and he thought it was due to his medicine. He does not have a primary care doctor.   Feb. 12, 2015:  He is seen back for a visit. His BP has remained elevated. He was readmitted to the hospital with shortness of breath. He was aggressively diuresed but then developed gout. He had documented uric acid crystals seen on a joint aspiration.  He is not able to avoid the colchicine. His blood pressure remains  elevated.  Avoids salt - canned food, bacon, sausage.  May 08, 2013;  His BP has been ok at home. He is avoiding salt. He had a severe gouty flare up . He was prescribed colchicine but could not afford it. He is hoping that he can afford it now that he is on Medicaid.   Also has lots of hip pain. Has seen a chiropractor. He likely needs to have a hip replacement.   August 05, 2013: [name] [name] is doing OK. He saw [name] . He's been having some nausea for the past 3-4 weeks. He complains of having headaches with the BIDil - resolves with tylenol.  His edema has resolved   Dec. 17, 2015:  [name] [name] is a 65 yo with hx of CVA, chronic systolic CHF , HTN.  He went to the ER on Sunday / Monday . Was having orthopnea. He received IV lasix and urinated quite a bit. Now is breathing better.   He is having some orthostatic hypotension. He feels dizzy and his BP is typically very low during these times.  He has adjusted his medication schedule such that he will hold his meds if he needs to go out to appointments.    April 08, 2014:   [name] [name] is a 65 y.o. male who presents for follow up of his CHF He has had some orthostatic hypotension.   Nov. 29, 2016:  Doing well.  Exercising at the senior center .  Breathing is ok.   Has lost some weight  BP has been low on occasion  Typically gets a little dizzy about 45 minutes after taking his meds - likely due to the BiDil.  Wakes up with a headache on occasion    April 29, 2015: Seen for follow up today  Did not take his Bidil this am. It causes such a headache for 3-4 hours and he is not able to do anything during that time .  Also gets dizzy.    Has been taking the Bidil only 1 tablet TID  BP is typically well controlled when he takes the medication   July 6 , 2017:  [name] [name] is doing well.   He was on BiDil but was having headaches.   We DC'd the Bidil and started Hydralazine .  Headaches have resolved.   BP  is much better.  Has had some trouble sleeping  - has to get up to go to the bathroom . Takes his evening Lasix at 7 PM - advised him to take it around 3 PM.     Past Medical History  Diagnosis Date  . Hypertension   . Systolic heart failure (HCC)     EF is 35 to 40%  . Asthma     'as a child"   . Shortness of breath     "comes on at anytime" (01/18/2013)  . Sickle cell trait ([place])   . Stroke ([place]) 12/1988  . CHF (congestive heart failure) ([place])   . Blood clotting disorder (HCC)     pt. states his family has a history of "slow clotting", had bleeding after oral surgery age 52 but none with extractions 2015 or adult surgeries; thought father (now deceased) had a hx of hemophilia  . History of gout   . Daily headache     are due to the medication he is taking  . Brain aneurysm 01/11/1989  . Full dentures   . Wears glasses   . Brain aneurysm   . Arthritis     "left hip; right knee" (10/09/2013)    Past Surgical History  Procedure Laterality Date  . 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 Cardiac catheterization  12/1988; 12/2012  . Multiple tooth extractions  spring 2015    "took out 7"  . Total hip arthroplasty Left 10/08/2013    anterior approach  . Gum surgery  1966  . Total hip arthroplasty Left 10/08/2013    Procedure: TOTAL HIP ARTHROPLASTY ANTERIOR APPROACH;  Surgeon: Renette Butters, MD;  Location: [facility];  Service: Orthopedics;  Laterality: Left;  [name] [name] Muscle biopsy N/A 12/06/2013    Procedure: FAT PAD BIOPSY;  Surgeon: Erroll Luna, MD;  Location: [facility];  Service: General;  Laterality: N/A;  . Left and right heart catheterization with coronary angiogram N/A 01/21/2013    Procedure: LEFT AND RIGHT HEART CATHETERIZATION WITH CORONARY ANGIOGRAM;  Surgeon: Minus Breeding, MD;  Location: [facility] CATH LAB;  Service: Cardiovascular;  Laterality: N/A;  . Hernia repair  2004?    "above the navel"   . Colonoscopy with propofol N/A 09/04/2014     Procedure: COLONOSCOPY WITH PROPOFOL;  Surgeon: [name], MD;  Location: [facility] ENDOSCOPY;  Service: Endoscopy;  Laterality: N/A;     Current Outpatient Prescriptions  Medication Sig Dispense Refill  . Acetaminophen 500 MG TBDP Take 1,000 mg by mouth every 6 (  six) hours as needed (for moderate pain).    [name] [name] allopurinol (ZYLOPRIM) 100 MG tablet TAKE 2 TABLETS(200 MG) BY MOUTH DAILY 180 tablet 1  . atorvastatin (LIPITOR) 20 MG tablet Take 1 tablet (20 mg total) by mouth daily. 90 tablet 1  . carvedilol (COREG) 25 MG tablet TAKE 1 TABLET BY MOUTH TWICE DAILY WITH A MEAL 180 tablet 3  . furosemide (LASIX) 40 MG tablet Take 1.5 tablets (60 mg) by mouth twice a day 90 tablet 7  . hydrALAZINE (APRESOLINE) 50 MG tablet Take 1 tablet (50 mg total) by mouth 3 (three) times daily. 90 tablet 11  . lisinopril (PRINIVIL,ZESTRIL) 20 MG tablet Take 1 tablet (20 mg total) by mouth daily. 30 tablet 6  . Magnesium 250 MG TABS Take 250 mg by mouth daily.    [name] [name] spironolactone (ALDACTONE) 25 MG tablet Take 1 tablet (25 mg total) by mouth daily. 90 tablet 3   No current facility-administered medications for this visit.    Allergies:   Hydrocodone and Hydrocodone-acetaminophen    Social History:  The patient  reports that he has never smoked. He has never used smokeless tobacco. He reports that he uses illicit drugs (Marijuana). He reports that he does not drink alcohol.   Family History:  The patient's family history includes Bleeding Disorder in his father; Cancer in his brother and father; Heart attack in his mother; Hyperlipidemia in his brother; Hypertension in his brother, father, mother, and sister; Varicose Veins in his mother.    ROS:  Please see the history of present illness.    Review of Systems: Constitutional:  denies fever, chills, diaphoresis, appetite change and fatigue.    HEENT: denies photophobia, eye pain, redness, hearing loss, ear pain, congestion, sore throat, rhinorrhea, sneezing,  neck pain, neck stiffness and tinnitus.  Respiratory: denies SOB, DOE, cough, chest tightness, and wheezing.  Cardiovascular: denies chest pain, palpitations and leg swelling.  Has had some dizziness.   Gastrointestinal: denies nausea, vomiting, abdominal pain, diarrhea, constipation, blood in stool.  Genitourinary: denies dysuria, urgency, frequency, hematuria, flank pain and difficulty urinating.  Musculoskeletal: denies  myalgias, back pain, joint swelling, arthralgias and gait problem.   Skin: denies pallor, rash and wound.  Neurological: admits to dizziness, , light-headedness   Hematological: denies adenopathy, easy bruising, personal or family bleeding history.  Psychiatric/ Behavioral: denies suicidal ideation, mood changes, confusion, nervousness, sleep disturbance and agitation.       All other systems are reviewed and negative.    PHYSICAL EXAM: VS:  BP 114/66 mmHg  Pulse 66  Ht 6\' 4"  (1.93 m)  Wt 262 lb 6.4 oz (119.024 kg)  BMI 31.95 kg/m2  SpO2 97% , BMI Body mass index is 31.95 kg/(m^2). GEN: Well nourished, well developed, in no acute distress HEENT: normal Neck: no JVD, carotid bruits, or masses Cardiac: RRR; no murmurs, rubs, or gallops,no edema  Respiratory:  clear to auscultation bilaterally, normal work of breathing GI: soft, nontender, nondistended, + BS MS: no deformity or atrophy Skin: warm and dry, no rash Neuro:  Strength and sensation are intact Psych: normal   EKG:  EKG is ordered today. Sinus brady with PACs.   HR 57.   TWI in V5-V6.     Recent Labs: 07/15/2015: ALT 24; BUN 23; Creatinine, Ser 1.40*; Platelets 274; Potassium 4.5; Sodium 141    Lipid Panel    Component Value Date/Time   CHOL 184 05/22/2013 1513   TRIG 87 05/22/2013 1513   HDL 42 05/22/2013  1513   CHOLHDL 4.4 05/22/2013 1513   VLDL 17 05/22/2013 1513   LDLCALC 125* 05/22/2013 1513      Wt Readings from Last 3 Encounters:  07/30/15 262 lb 6.4 oz (119.024 kg)  07/15/15  264 lb 3.2 oz (119.84 kg)  06/10/15 261 lb (118.389 kg)      Other studies Reviewed: Additional studies/ records that were reviewed today include: . Review of the above records demonstrates:    ASSESSMENT AND PLAN:  1. Hypertension-   BP is well controlled   2. Chronic systolic Congestive heart failure - EF 35-44%.  Doing very well   3. History of stroke  4. Gout   Current medicines are reviewed at length with the patient today.  The patient does not have concerns regarding medicines.  The following changes have been made:  See above   Labs/ tests ordered today include:  No orders of the defined types were placed in this encounter.    Disposition:   FU with me in 6 months     Signed, Mertie Moores, MD  07/30/2015 8:33 AM    [facility] [place], [place], [place]  [address] Phone: [phone]; Fax: [phone]

## 2015-10-12 ENCOUNTER — Encounter: Payer: Self-pay | Admitting: Vascular Surgery

## 2015-10-15 NOTE — Progress Notes (Signed)
Established Carotid Patient  History of Present Illness  [name] [name] is a 65 y.o. (13-Apr-1950) male who presents with chief complaint: "taking too much medication".  Previous carotid studies demonstrated: RICA 123456 stenosis, LICA 123456 stenosis.  Patient has reported history of CVA, though patient is not clear his CVA sx.  The patient has never had amaurosis fugax or monocular blindness.  The patient has never had facial drooping or hemiplegia.  The patient has never had receptive or expressive aphasia.    The patient's PMH, PSH, SH, and FamHx are unchanged from 10/10/14.  Current Outpatient Prescriptions  Medication Sig Dispense Refill  . Acetaminophen 500 MG TBDP Take 1,000 mg by mouth every 6 (six) hours as needed (for moderate pain).    [name] allopurinol (ZYLOPRIM) 100 MG tablet TAKE 2 TABLETS(200 MG) BY MOUTH DAILY 180 tablet 1  . atorvastatin (LIPITOR) 20 MG tablet Take 1 tablet (20 mg total) by mouth daily. 90 tablet 1  . carvedilol (COREG) 25 MG tablet TAKE 1 TABLET BY MOUTH TWICE DAILY WITH A MEAL 180 tablet 3  . furosemide (LASIX) 40 MG tablet Take 1.5 tablets (60 mg) by mouth twice a day 90 tablet 7  . hydrALAZINE (APRESOLINE) 50 MG tablet Take 1 tablet (50 mg total) by mouth 3 (three) times daily. 90 tablet 11  . lisinopril (PRINIVIL,ZESTRIL) 20 MG tablet Take 1 tablet (20 mg total) by mouth daily. 30 tablet 6  . spironolactone (ALDACTONE) 25 MG tablet Take 1 tablet (25 mg total) by mouth daily. 90 tablet 3  . Magnesium 250 MG TABS Take 250 mg by mouth daily.     No current facility-administered medications for this visit.     Allergies  Allergen Reactions  . Hydrocodone Itching    Itch and crazy  . Hydrocodone-Acetaminophen Hives    On ROS today: no CVA or TIA sx, no arm sx   Physical Examination  Vitals:   10/16/15 1003 10/16/15 1006  BP: (!) 152/81 (!) 144/84  Pulse: 65   SpO2: 96%   Weight: 270 lb (122.5 kg)   Height: 6\' 4"  (1.93 m)    Body mass index  is 32.87 kg/m.  General: A&O x 3, WDWN  Eyes: PERRLA, EOMI  Neck: Supple, no nuchal rigidity, no palpable LAD  Pulmonary: Sym exp, good air movt, CTAB, no rales, rhonchi, & wheezing  Cardiac: RRR, Nl S1, S2, no Murmurs, rubs or gallops  Vascular: Vessel Right Left  Radial Palpable Palpable  Brachial Palpable Palpable  Carotid Palpable, without bruit Palpable, without bruit  Aorta Not palpable N/A  Femoral Palpable Palpable  Popliteal Not palpable Not palpable  PT Palpable Palpable  DP Palpable Palpable   Gastrointestinal: soft, NTND, no G/R, no HSM, no masses, no CVAT B  Musculoskeletal: M/S 5/5 throughout , Extremities without ischemic changes   Neurologic: CN 2-12 intact , Pain and light touch intact in extremities , Motor exam as listed above   Non-Invasive Vascular Imaging  CAROTID DUPLEX (Date: 10/16/15):   R ICA stenosis: 1-39%  R VA: patent and antegrade  L ICA stenosis: 1-39%  L VA:  patent and antegrade  R SCA triphasic   Medical Decision Making  Mamadi Ferraris is a 65 y.o. male who presents with: asx B ICA stenosis <40%, no evidence of hemodynamically significant SCA stenosis, distant history of CVA   Given the second normal B carotid duplex, I think the patient can be discharge from routine surveillance.    Again there is  no evidence of a SCA stenosis.  Thank you for allowing [place] to participate in this patient's care.  Patient can follow up with [place] as needed.   Adele Barthel, MD, FACS [facility] Office: [phone] Pager: [phone]

## 2015-10-16 ENCOUNTER — Ambulatory Visit (HOSPITAL_COMMUNITY)
Admission: RE | Admit: 2015-10-16 | Discharge: 2015-10-16 | Disposition: A | Discharge status: Home / Self Care | Payer: Medicare Other | Source: Ambulatory Visit | Attending: Vascular Surgery | Admitting: Vascular Surgery

## 2015-10-16 ENCOUNTER — Ambulatory Visit (INDEPENDENT_AMBULATORY_CARE_PROVIDER_SITE_OTHER): Payer: Medicare Other | Admitting: Vascular Surgery

## 2015-10-16 ENCOUNTER — Encounter: Payer: Self-pay | Admitting: Vascular Surgery

## 2015-10-16 VITALS — BP 144/84 | HR 65 | Ht 76.0 in | Wt 270.0 lb

## 2015-10-16 DIAGNOSIS — I771 Stricture of artery: Secondary | ICD-10-CM | POA: Insufficient documentation

## 2015-10-16 DIAGNOSIS — Z8673 Personal history of transient ischemic attack (TIA), and cerebral infarction without residual deficits: Secondary | ICD-10-CM

## 2015-10-16 LAB — VAS US CAROTID
LEFT ECA DIAS: -21 cm/s
LEFT VERTEBRAL DIAS: 12 cm/s
LICAPDIAS: 17 cm/s
Left CCA dist dias: -26 cm/s
Left CCA dist sys: -134 cm/s
Left CCA prox dias: 26 cm/s
Left CCA prox sys: 146 cm/s
Left ICA dist dias: -25 cm/s
Left ICA dist sys: -67 cm/s
Left ICA prox sys: 118 cm/s
RCCAPDIAS: 18 cm/s
RIGHT CCA MID DIAS: 17 cm/s
RIGHT ECA DIAS: -14 cm/s
Right CCA prox sys: 165 cm/s
Right cca dist sys: 47 cm/s

## 2015-10-20 ENCOUNTER — Other Ambulatory Visit: Payer: Self-pay | Admitting: Internal Medicine

## 2015-11-26 ENCOUNTER — Other Ambulatory Visit: Payer: Self-pay | Admitting: Internal Medicine

## 2015-12-09 ENCOUNTER — Other Ambulatory Visit: Payer: Self-pay | Admitting: Cardiovascular Disease

## 2015-12-29 ENCOUNTER — Other Ambulatory Visit: Payer: Self-pay | Admitting: Internal Medicine

## 2016-01-08 ENCOUNTER — Encounter: Payer: Self-pay | Admitting: Cardiovascular Disease

## 2016-01-28 ENCOUNTER — Ambulatory Visit (INDEPENDENT_AMBULATORY_CARE_PROVIDER_SITE_OTHER): Payer: Medicare HMO | Admitting: Internal Medicine

## 2016-01-28 ENCOUNTER — Encounter: Payer: Self-pay | Admitting: Cardiovascular Disease

## 2016-01-28 ENCOUNTER — Encounter: Payer: Self-pay | Admitting: Internal Medicine

## 2016-01-28 ENCOUNTER — Ambulatory Visit (INDEPENDENT_AMBULATORY_CARE_PROVIDER_SITE_OTHER): Payer: Medicare HMO | Admitting: Cardiovascular Disease

## 2016-01-28 VITALS — BP 147/83 | HR 72 | Temp 98.5°F | Wt 276.4 lb

## 2016-01-28 VITALS — BP 110/80 | HR 82 | Ht 76.0 in | Wt 272.6 lb

## 2016-01-28 DIAGNOSIS — Z23 Encounter for immunization: Secondary | ICD-10-CM

## 2016-01-28 DIAGNOSIS — I130 Hypertensive heart and chronic kidney disease with heart failure and stage 1 through stage 4 chronic kidney disease, or unspecified chronic kidney disease: Secondary | ICD-10-CM

## 2016-01-28 DIAGNOSIS — N182 Chronic kidney disease, stage 2 (mild): Secondary | ICD-10-CM | POA: Diagnosis not present

## 2016-01-28 DIAGNOSIS — E782 Mixed hyperlipidemia: Secondary | ICD-10-CM | POA: Diagnosis not present

## 2016-01-28 DIAGNOSIS — M159 Polyosteoarthritis, unspecified: Secondary | ICD-10-CM

## 2016-01-28 DIAGNOSIS — M150 Primary generalized (osteo)arthritis: Secondary | ICD-10-CM

## 2016-01-28 DIAGNOSIS — E785 Hyperlipidemia, unspecified: Secondary | ICD-10-CM

## 2016-01-28 DIAGNOSIS — Z0000 Encounter for general adult medical examination without abnormal findings: Secondary | ICD-10-CM

## 2016-01-28 DIAGNOSIS — I10 Essential (primary) hypertension: Secondary | ICD-10-CM

## 2016-01-28 DIAGNOSIS — Z125 Encounter for screening for malignant neoplasm of prostate: Secondary | ICD-10-CM

## 2016-01-28 DIAGNOSIS — E7800 Pure hypercholesterolemia, unspecified: Secondary | ICD-10-CM

## 2016-01-28 DIAGNOSIS — Z96642 Presence of left artificial hip joint: Secondary | ICD-10-CM

## 2016-01-28 DIAGNOSIS — I5022 Chronic systolic (congestive) heart failure: Secondary | ICD-10-CM

## 2016-01-28 DIAGNOSIS — R739 Hyperglycemia, unspecified: Secondary | ICD-10-CM

## 2016-01-28 DIAGNOSIS — Z8042 Family history of malignant neoplasm of prostate: Secondary | ICD-10-CM

## 2016-01-28 DIAGNOSIS — Z79899 Other long term (current) drug therapy: Secondary | ICD-10-CM

## 2016-01-28 LAB — GLUCOSE, CAPILLARY: Glucose-Capillary: 84 mg/dL (ref 65–99)

## 2016-01-28 LAB — POCT GLYCOSYLATED HEMOGLOBIN (HGB A1C): Hemoglobin A1C: 5.6

## 2016-01-28 MED ORDER — CARVEDILOL 25 MG PO TABS: ORAL_TABLET | ORAL | 3 refills | Status: DC

## 2016-01-28 MED ORDER — LISINOPRIL 20 MG PO TABS: 20.0000 mg | ORAL_TABLET | Freq: Every day | ORAL | 3 refills | Status: DC

## 2016-01-28 MED ORDER — ALLOPURINOL 100 MG PO TABS: 200.0000 mg | ORAL_TABLET | Freq: Every day | ORAL | 0 refills | Status: DC

## 2016-01-28 MED ORDER — HYDRALAZINE HCL 50 MG PO TABS: 50.0000 mg | ORAL_TABLET | Freq: Three times a day (TID) | ORAL | 3 refills | Status: DC

## 2016-01-28 MED ORDER — ATORVASTATIN CALCIUM 20 MG PO TABS: 20.0000 mg | ORAL_TABLET | Freq: Every day | ORAL | 3 refills | Status: DC

## 2016-01-28 MED ORDER — SPIRONOLACTONE 25 MG PO TABS: 25.0000 mg | ORAL_TABLET | Freq: Every day | ORAL | 3 refills | Status: DC

## 2016-01-28 MED ORDER — FUROSEMIDE 40 MG PO TABS: ORAL_TABLET | ORAL | 3 refills | Status: DC

## 2016-01-28 NOTE — Assessment & Plan Note (Signed)
Patient follows with cardiology last appointment was earlier today. He's been doing well without significant weight gain. His confusion about his medications but he has had these addressed by cardiology earlier today. His regimen is furosemide 40 mg twice a day, carvedilol 25 milligrams twice a day, lisinopril 20 mg daily, spironolactone 25 mg daily, hydralazine 50 mg 3 times a day. He denies any significant shortness of breath or lower extremity edema today. He does have difficulty walking for extended periods. Agree with cardiology's plan from earlier today continue current management.

## 2016-01-28 NOTE — Assessment & Plan Note (Addendum)
Patient has had mildly elevated blood glucoses just over 100 on previous BMP and last A1c in 2050 was 5.3. Given prediabetic blood glucoses, we will check A1c again today. Reassess patient diet and importance of exercise.  A1c 5.6 today. Continue to monitor.

## 2016-01-28 NOTE — Assessment & Plan Note (Addendum)
Patient history of multiple joint osteoarthritis and had a left hip replacement several years ago. He occasionally has some right knee pain for which he does not take medication. He is followed with orthopedics in the past and we discussed referral for consideration of joint replacement therapy for his right knee. Currently pain is not severe enough and will recommend APAP. Reevaluate and refer in the future as needed.

## 2016-01-28 NOTE — Assessment & Plan Note (Signed)
Currently on hydralazine 50 mg 3 times a day, spironolactone 25 mg daily, carvedilol 25 mg twice a day, lisinopril 20 mg daily, furosemide 40 mg twice a day. On this regimen his blood pressure is relatively well controlled and he follows with cardiology for hypertension and for systolic heart failure. Continue current therapy.

## 2016-01-28 NOTE — Progress Notes (Signed)
CC: HTN, CHF, CKD, hyperglycemia, HLD f/u HPI: [name] is a 66 y.o. male with a h/o of HTN, HLD, CHF, CKD, hyperglycemia who presents for routine f/u of the above problems.  Please see Problem-based charting for HPI and the status of patient's chronic medical conditions.  Past Medical History:  Diagnosis Date  . Arthritis    "left hip; right knee" (10/09/2013)  . Asthma    'as a child"   . Blood clotting disorder (HCC)    pt. states his family has a history of "slow clotting", had bleeding after oral surgery age 64 but none with extractions 2015 or adult surgeries; thought father (now deceased) had a hx of hemophilia  . Brain aneurysm 01/11/1989  . Brain aneurysm   . CHF (congestive heart failure) ([place])   . Daily headache    are due to the medication he is taking  . Full dentures   . History of gout   . Hypertension   . Shortness of breath    "comes on at anytime" (01/18/2013)  . Sickle cell trait ([place])   . Stroke ([place]) 12/1988  . Systolic heart failure (HCC)    EF is 35 to 40%  . Wears glasses     Social Hx: Social History  Substance Use Topics  . Smoking status: Never Smoker  . Smokeless tobacco: Never Used  . Alcohol use No   Review of Systems: ROS in HPI. Otherwise: Review of Systems  Constitutional: Negative for chills, fever and weight loss.  Respiratory: Negative for cough and shortness of breath.   Cardiovascular: Negative for chest pain and leg swelling.  Gastrointestinal: Negative for abdominal pain, constipation, diarrhea, nausea and vomiting.  Genitourinary: Negative for dysuria, frequency and urgency.    Physical Exam: Vitals:   01/28/16 1435  BP: (!) 147/83  Pulse: 72  Temp: 98.5 F (36.9 C)  TempSrc: Oral  SpO2: 98%  Weight: 276 lb 6.4 oz (125.4 kg)   Physical Exam  Constitutional: He is cooperative. No distress.  Cardiovascular: Normal rate, regular rhythm, normal heart sounds and normal pulses.  Exam reveals no gallop.   No  murmur heard. Pulmonary/Chest: Effort normal and breath sounds normal. No respiratory distress. He has no wheezes. He has no rhonchi. He has no rales.  Abdominal: Soft. Bowel sounds are normal. There is no tenderness.  Genitourinary: Rectum normal and prostate normal.  Musculoskeletal: He exhibits no edema.  Skin: Skin is intact.    Assessment & Plan:  See encounters tab for problem based medical decision making. Patient discussed with Dr. Evette Doffing  HTN (hypertension) Currently on hydralazine 50 mg 3 times a day, spironolactone 25 mg daily, carvedilol 25 mg twice a day, lisinopril 20 mg daily, furosemide 40 mg twice a day. On this regimen his blood pressure is relatively well controlled and he follows with cardiology for hypertension and for systolic heart failure. Continue current therapy.  Hyperglycemia Patient has had mildly elevated blood glucoses just over 100 on previous BMP and last A1c in 2050 was 5.3. Given prediabetic blood glucoses, we will check A1c again today. Reassess patient diet and importance of exercise.  A1c 5.6 today. Continue to monitor.  Osteoarthritis Patient history of multiple joint osteoarthritis and had a left hip replacement several years ago. He occasionally has some right knee pain for which he does not take medication. He is followed with orthopedics in the past and we discussed referral for consideration of joint replacement therapy for his right knee. Currently  pain is not severe enough and will recommend APAP. Reevaluate and refer in the future as needed.  Hyperlipidemia Patient's last lipid panel was appropriate with good success of atorvastatin 20 mg. We'll continue this for treatment of hyperlipidemia and prophylaxis of cardiac risk.  CKD (chronic kidney disease) stage 2, GFR 60-89 ml/min Patient's renal functions remain stable on last several BMPs. Denies any urinary symptoms. Currently on lisinopril for systolic heart failure. Continue current  therapy.  Chronic systolic congestive heart failure [facility]) Patient follows with cardiology last appointment was earlier today. He's been doing well without significant weight gain. His confusion about his medications but he has had these addressed by cardiology earlier today. His regimen is furosemide 40 mg twice a day, carvedilol 25 milligrams twice a day, lisinopril 20 mg daily, spironolactone 25 mg daily, hydralazine 50 mg 3 times a day. He denies any significant shortness of breath or lower extremity edema today. He does have difficulty walking for extended periods. Agree with cardiology's plan from earlier today continue current management.  Prostate cancer screening Family h/o prostate CA. Frequent urination on Lasix. No hesitancy. Occasional urge incontinence. Last PSA in 2015 wnl. Prostate exam today upon request w/o abnormality. No BPH. Given high risk for planned PSA testing in 1 year.   Signed: Holley Raring, MD 01/28/2016, 4:17 PM  Pager: [phone]

## 2016-01-28 NOTE — Patient Instructions (Signed)

## 2016-01-28 NOTE — Assessment & Plan Note (Signed)
Patient's last lipid panel was appropriate with good success of atorvastatin 20 mg. We'll continue this for treatment of hyperlipidemia and prophylaxis of cardiac risk.

## 2016-01-28 NOTE — Assessment & Plan Note (Signed)
Patient's renal functions remain stable on last several BMPs. Denies any urinary symptoms. Currently on lisinopril for systolic heart failure. Continue current therapy.

## 2016-01-28 NOTE — Patient Instructions (Addendum)
It was a pleasure to meet you. Please continue to take your medications.  Please enjoy your trip to the [place]! We will follow up in 6 months unless you have a problem.

## 2016-01-28 NOTE — Progress Notes (Signed)
Cardiology Office Note   Date:  01/28/2016   ID:  [name] [name], DOB 12/18/1950, MRN [id number]  PCP:  Noah Delaine, MD  Cardiologist:   Mertie Moores, MD   Chief Complaint  Patient presents with  . Congestive Heart Failure   1. Hypertension 2. Chronic systolic Congestive heart failure - EF 35-44% 3. History of stroke 4. Gout  History of Present Illness: [name] [name] is seen back today for a post hospital/TOC visit. [name] He has had longstanding HTN, asthma, and prior CVA. Originally from the [place].   Most recently presented with increased SOB, DOE and orthopnea for 3 weeks - he was found to be hypertensive, using NSAID, etc. Had been taking some Lasix that he was prescribed while living in [place]. He was cathed - had mild CAD with elevated right sided pressures. Bidil added along with ACE. Renal function improved. EF was 35 to 40%.   Comes back today. Here alone. Medicines are all mixed up - on both OTC potassium and prescription and only taking the OTC agent. He is not taking Mobic (which was left on his discharge meds). Has not had his medicines today. His car had to be towed and so he did not eat and thus did not take his medicines. He notes that his breathing is much better. Weight is stable at home. His swelling has improved but not totally resolved. Some ankle pain. Has chronic hip pain and wanted to have THR in the future. No chest pain. Avoiding salt. Does not drink alcohol. Trying to get on Medicaid. Works part time in [place] and needs to return to work so he can buy his medicines. Some headache and he thought it was due to his medicine. He does not have a primary care doctor.   Feb. 12, 2015:  He is seen back for a visit. His BP has remained elevated. He was readmitted to the hospital with shortness of breath. He was aggressively diuresed but then developed gout. He had documented uric acid crystals seen on a joint aspiration.  He is not able to avoid the  colchicine. His blood pressure remains elevated.  Avoids salt - canned food, bacon, sausage.  May 08, 2013;  His BP has been ok at home. He is avoiding salt. He had a severe gouty flare up . He was prescribed colchicine but could not afford it. He is hoping that he can afford it now that he is on Medicaid.   Also has lots of hip pain. Has seen a chiropractor. He likely needs to have a hip replacement.   August 05, 2013: [name] [name] is doing OK. He saw [name] . He's been having some nausea for the past 3-4 weeks. He complains of having headaches with the BIDil - resolves with tylenol.  His edema has resolved   Dec. 17, 2015:  [name] [name] is a 66 yo with hx of CVA, chronic systolic CHF , HTN.  He went to the ER on Sunday / Monday . Was having orthopnea. He received IV lasix and urinated quite a bit. Now is breathing better.   He is having some orthostatic hypotension. He feels dizzy and his BP is typically very low during these times.  He has adjusted his medication schedule such that he will hold his meds if he needs to go out to appointments.    April 08, 2014:   [name] [name] is a 66 y.o. male who presents for follow up of his CHF He has had some  orthostatic hypotension.   Nov. 29, 2016:  Doing well.  Exercising at the senior center .  Breathing is ok.   Has lost some weight  BP has been low on occasion  Typically gets a little dizzy about 45 minutes after taking his meds - likely due to the BiDil.  Wakes up with a headache on occasion    April 29, 2015: Seen for follow up today  Did not take his Bidil this am. It causes such a headache for 3-4 hours and he is not able to do anything during that time .  Also gets dizzy.    Has been taking the Bidil only 1 tablet TID  BP is typically well controlled when he takes the medication   July 6 , 2017:  [name] [name] is doing well.   He was on BiDil but was having headaches.   We DC'd the Bidil and started  Hydralazine .  Headaches have resolved.   BP is much better.  Has had some trouble sleeping  - has to get up to go to the bathroom . Takes his evening Lasix at 7 PM - advised him to take it around 3 PM.  Jan. 4, 2018:  Breathing is good Going to [place] next week for 3 weeks Has continued to have headaches.  Not related to medications as far as he can tell.   His disability has been approved.   Is now on medicare Wants a handicap license plate.  Has dyspnea walking any significant distance      Past Medical History:  Diagnosis Date  . Arthritis    "left hip; right knee" (10/09/2013)  . Asthma    'as a child"   . Blood clotting disorder (HCC)    pt. states his family has a history of "slow clotting", had bleeding after oral surgery age 89 but none with extractions 2015 or adult surgeries; thought father (now deceased) had a hx of hemophilia  . Brain aneurysm 01/11/1989  . Brain aneurysm   . CHF (congestive heart failure) ([place])   . Daily headache    are due to the medication he is taking  . Full dentures   . History of gout   . Hypertension   . Shortness of breath    "comes on at anytime" (01/18/2013)  . Sickle cell trait ([place])   . Stroke ([place]) 12/1988  . Systolic heart failure (HCC)    EF is 35 to 40%  . Wears glasses     Past Surgical History:  Procedure Laterality Date  . CARDIAC CATHETERIZATION  12/1988; 12/2012  . COLONOSCOPY WITH PROPOFOL N/A 09/04/2014   Procedure: COLONOSCOPY WITH PROPOFOL;  Surgeon: [name], MD;  Location: [facility];  Service: Endoscopy;  Laterality: N/A;  . [place]  . HERNIA REPAIR  2004?   "above the navel"   . LEFT AND RIGHT HEART CATHETERIZATION WITH CORONARY ANGIOGRAM N/A 01/21/2013   Procedure: LEFT AND RIGHT HEART CATHETERIZATION WITH CORONARY ANGIOGRAM;  Surgeon: Minus Breeding, MD;  Location[facility] C[facility] LAB;  Service: Cardiovascular;  Laterality: N/A;  . MULTIPLE TOOTH EXTRACTIONS  spring 2015   "took out  7"  . MUSCLE BIOPSY N/A 12/06/2013   Procedure: FAT PAD BIOPSY;  Surgeon: Erroll Luna, MD;  Location: [facility];  Service: General;  Laterality: N/A;  . NM PET  LYMPHOMA INITIAL  2004?   "benign"  . TOTAL HIP ARTHROPLASTY Left 10/08/2013   anterior approach  . TOTAL  HIP ARTHROPLASTY Left 10/08/2013   Procedure: TOTAL HIP ARTHROPLASTY ANTERIOR APPROACH;  Surgeon: Renette Butters, MD;  Location: [facility];  Service: Orthopedics;  Laterality: Left;     Current Outpatient Prescriptions  Medication Sig Dispense Refill  . Acetaminophen 500 MG TBDP Take 1,000 mg by mouth every 6 (six) hours as needed (for moderate pain).    [name] allopurinol (ZYLOPRIM) 100 MG tablet TAKE 2 TABLETS BY MOUTH DAILY 60 tablet 0  . atorvastatin (LIPITOR) 20 MG tablet TAKE 1 TABLET(20 MG) BY MOUTH DAILY 90 tablet 0  . carvedilol (COREG) 25 MG tablet TAKE 1 TABLET BY MOUTH TWICE DAILY WITH A MEAL 180 tablet 3  . furosemide (LASIX) 40 MG tablet Take 1.5 tablets (60 mg) by mouth twice a day 90 tablet 7  . hydrALAZINE (APRESOLINE) 50 MG tablet Take 1 tablet (50 mg total) by mouth 3 (three) times daily. 90 tablet 11  . lisinopril (PRINIVIL,ZESTRIL) 20 MG tablet TAKE 1 TABLET(20 MG) BY MOUTH DAILY 30 tablet 6  . Magnesium 250 MG TABS Take 250 mg by mouth daily.    [name] spironolactone (ALDACTONE) 25 MG tablet Take 1 tablet (25 mg total) by mouth daily. 90 tablet 3   No current facility-administered medications for this visit.     Allergies:   Hydrocodone and Hydrocodone-acetaminophen    Social History:  The patient  reports that he has never smoked. He has never used smokeless tobacco. He reports that he uses drugs, including Marijuana. He reports that he does not drink alcohol.   Family History:  The patient's family history includes Bleeding Disorder in his father; Cancer in his brother and father; Heart attack in his mother; Hyperlipidemia in his brother; Hypertension in his brother, father, mother, and sister;  Varicose Veins in his mother.    ROS:  Please see the history of present illness.    Review of Systems: Constitutional:  denies fever, chills, diaphoresis, appetite change and fatigue.    HEENT: denies photophobia, eye pain, redness, hearing loss, ear pain, congestion, sore throat, rhinorrhea, sneezing, neck pain, neck stiffness and tinnitus.  Respiratory: denies SOB, DOE, cough, chest tightness, and wheezing.  Cardiovascular: denies chest pain, palpitations and leg swelling.  Has had some dizziness.   Gastrointestinal: denies nausea, vomiting, abdominal pain, diarrhea, constipation, blood in stool.  Genitourinary: denies dysuria, urgency, frequency, hematuria, flank pain and difficulty urinating.  Musculoskeletal: denies  myalgias, back pain, joint swelling, arthralgias and gait problem.   Skin: denies pallor, rash and wound.  Neurological: admits to dizziness, , light-headedness   Hematological: denies adenopathy, easy bruising, personal or family bleeding history.  Psychiatric/ Behavioral: denies suicidal ideation, mood changes, confusion, nervousness, sleep disturbance and agitation.       All other systems are reviewed and negative.    PHYSICAL EXAM: VS:  BP 110/80 (BP Location: Left Arm, Patient Position: Sitting, Cuff Size: Normal)   Pulse 82   Ht 6\' 4"  (1.93 m)   Wt 272 lb 9.6 oz (123.7 kg)   SpO2 97%   BMI 33.18 kg/m  , BMI Body mass index is 33.18 kg/m. GEN: Well nourished, well developed, in no acute distress  HEENT: normal  Neck: no JVD, carotid bruits, or masses Cardiac: RRR; no murmurs, rubs, or gallops,no edema  Respiratory:  clear to auscultation bilaterally, normal work of breathing GI: soft, nontender, nondistended, + BS MS: no deformity or atrophy  Skin: warm and dry, no rash Neuro:  Strength and sensation are intact Psych: normal  EKG:  EKG is not ordered today.   Recent Labs: 07/15/2015: ALT 24; BUN 23; Creatinine, Ser 1.40; Platelets 274;  Potassium 4.5; Sodium 141    Lipid Panel    Component Value Date/Time   CHOL 136 07/30/2015 0842   TRIG 97 07/30/2015 0842   HDL 41 07/30/2015 0842   CHOLHDL 3.3 07/30/2015 0842   VLDL 19 07/30/2015 0842   LDLCALC 76 07/30/2015 0842      Wt Readings from Last 3 Encounters:  01/28/16 272 lb 9.6 oz (123.7 kg)  10/16/15 270 lb (122.5 kg)  07/30/15 262 lb 6.4 oz (119 kg)      Other studies Reviewed: Additional studies/ records that were reviewed today include: . Review of the above records demonstrates:    ASSESSMENT AND PLAN:  1. Hypertension-   BP is well controlled   2. Chronic systolic Congestive heart failure - EF 35-44%.  Doing very well  Still has difficulty walking long distances. Will give him a handicap placard.   3. History of stroke  4. Gout  5. Hyperlipidemia: We'll check labs today.  Current medicines are reviewed at length with the patient today.  The patient does not have concerns regarding medicines.  The following changes have been made:  See above   Labs/ tests ordered today include:  No orders of the defined types were placed in this encounter.   Disposition:   FU with me in 6 months     Signed, Mertie Moores, MD  01/28/2016 9:39 AM    [facility] [address], [place], [place]  [address] Phone: [phone]; Fax: [phone]

## 2016-01-28 NOTE — Assessment & Plan Note (Signed)
Flu vaccine given today. 

## 2016-01-28 NOTE — Assessment & Plan Note (Addendum)
Family h/o prostate CA. Frequent urination on Lasix. No hesitancy. Occasional urge incontinence. Last PSA in 2015 wnl. Prostate exam today upon request w/o abnormality. No BPH. Given high risk for planned PSA testing in 1 year.

## 2016-01-29 LAB — LIPID PANEL
CHOL/HDL RATIO: 4.2 ratio (ref 0.0–5.0)
CHOLESTEROL TOTAL: 160 mg/dL (ref 100–199)
HDL: 38 mg/dL — AB (ref >39)
LDL CALC: 99 mg/dL (ref 0–99)
TRIGLYCERIDES: 115 mg/dL (ref 0–149)
VLDL Cholesterol Cal: 23 mg/dL (ref 5–40)

## 2016-01-29 LAB — COMPREHENSIVE METABOLIC PANEL
ALT: 28 IU/L (ref 0–44)
AST: 18 IU/L (ref 0–40)
Albumin/Globulin Ratio: 1.3 (ref 1.2–2.2)
Albumin: 4 g/dL (ref 3.6–4.8)
Alkaline Phosphatase: 78 IU/L (ref 39–117)
BUN/Creatinine Ratio: 15 (ref 10–24)
BUN: 20 mg/dL (ref 8–27)
Bilirubin Total: 0.9 mg/dL (ref 0.0–1.2)
CALCIUM: 9.8 mg/dL (ref 8.6–10.2)
CO2: 24 mmol/L (ref 18–29)
CREATININE: 1.37 mg/dL — AB (ref 0.76–1.27)
Chloride: 100 mmol/L (ref 96–106)
GFR calc Af Amer: 63 mL/min/{1.73_m2} (ref >59)
GFR, EST NON AFRICAN AMERICAN: 55 mL/min/{1.73_m2} — AB (ref >59)
Globulin, Total: 3 g/dL (ref 1.5–4.5)
Glucose: 115 mg/dL — ABNORMAL HIGH (ref 65–99)
POTASSIUM: 4.4 mmol/L (ref 3.5–5.2)
Sodium: 140 mmol/L (ref 134–144)
Total Protein: 7 g/dL (ref 6.0–8.5)

## 2016-01-29 NOTE — Progress Notes (Signed)
Internal Medicine Clinic Attending  Case discussed with Dr. Strelow at the time of the visit.  We reviewed the resident's history and exam and pertinent patient test results.  I agree with the assessment, diagnosis, and plan of care documented in the resident's note.  

## 2016-01-30 IMAGING — CR DG PORTABLE PELVIS
1 series · 1 of 1 positions shown · non-contrast
Comparison: None.

CLINICAL DATA: Status post left hip replacement

EXAM:
PORTABLE PELVIS 1-2 VIEWS

[AP]
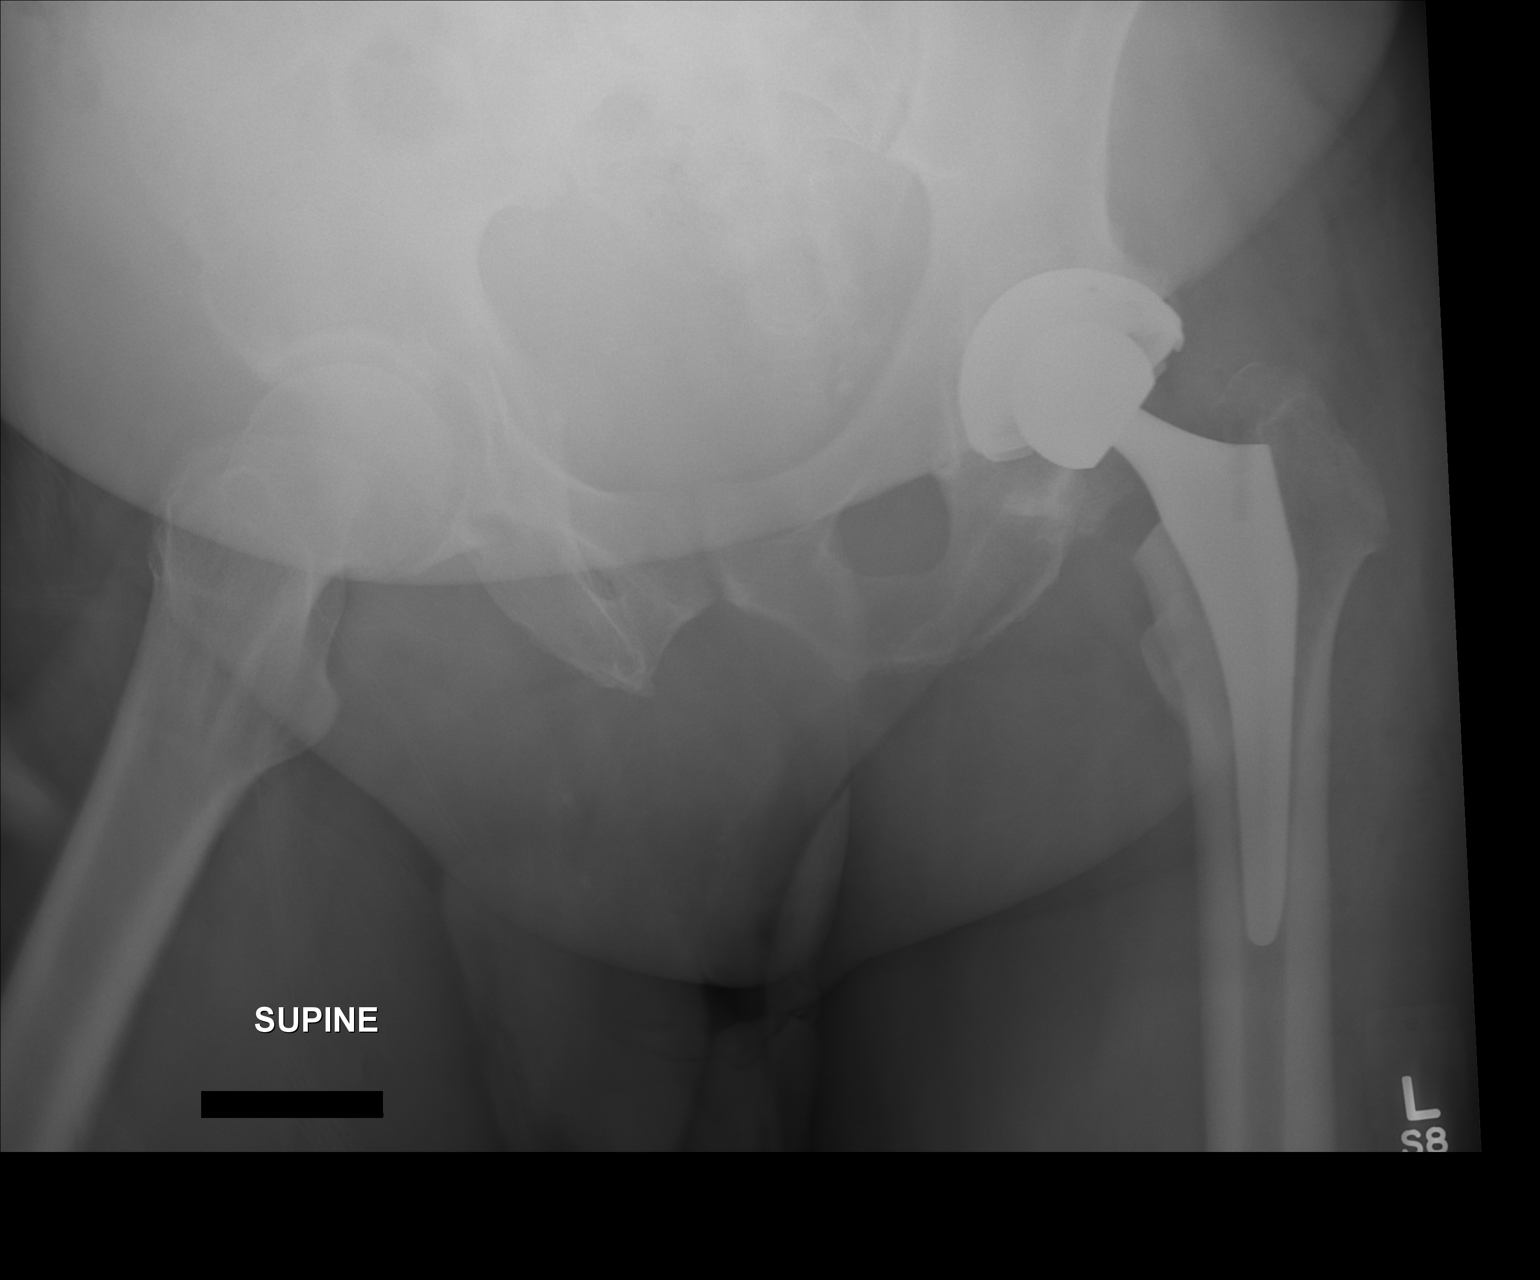

[1 of 1 positions shown; findings below may reference images not displayed]

FINDINGS: A left hip replacement is seen.  No acute abnormality is noted.

## 2016-02-27 ENCOUNTER — Other Ambulatory Visit: Payer: Self-pay | Admitting: Internal Medicine

## 2016-03-07 ENCOUNTER — Encounter: Payer: Self-pay | Admitting: Cardiovascular Disease

## 2016-03-07 ENCOUNTER — Ambulatory Visit (INDEPENDENT_AMBULATORY_CARE_PROVIDER_SITE_OTHER): Payer: Medicare HMO | Admitting: Cardiovascular Disease

## 2016-03-07 VITALS — BP 90/64 | HR 80 | Ht 76.0 in | Wt 276.0 lb

## 2016-03-07 DIAGNOSIS — I5022 Chronic systolic (congestive) heart failure: Secondary | ICD-10-CM | POA: Diagnosis not present
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
## 2016-03-07 NOTE — Patient Instructions (Signed)
Medication Instructions:  Your physician recommends that you continue on your current medications as directed. Please refer to the Current Medication list given to you today.   Labwork: None Ordered   Testing/Procedures: Your physician has requested that you have an echocardiogram. Echocardiography is a painless test that uses sound waves to create images of your heart. It provides your doctor with information about the size and shape of your heart and how well your heart's chambers and valves are working. This procedure takes approximately one hour. There are no restrictions for this procedure.    Follow-Up: Your physician recommends that you schedule a follow-up appointment in: 3 months with Dr. Nahser.    If you need a refill on your cardiac medications before your next appointment, please call your pharmacy.   Thank you for choosing [facility]! Zarrah Loveland, RN [phone]    

## 2016-03-22 ENCOUNTER — Other Ambulatory Visit: Payer: Self-pay

## 2016-03-22 ENCOUNTER — Ambulatory Visit (HOSPITAL_COMMUNITY): Payer: Medicare HMO | Attending: Cardiovascular Disease

## 2016-03-22 DIAGNOSIS — I110 Hypertensive heart disease with heart failure: Secondary | ICD-10-CM | POA: Insufficient documentation

## 2016-03-22 DIAGNOSIS — Z8673 Personal history of transient ischemic attack (TIA), and cerebral infarction without residual deficits: Secondary | ICD-10-CM | POA: Insufficient documentation

## 2016-03-22 DIAGNOSIS — I5022 Chronic systolic (congestive) heart failure: Secondary | ICD-10-CM | POA: Insufficient documentation

## 2016-03-27 ENCOUNTER — Other Ambulatory Visit: Payer: Self-pay | Admitting: Internal Medicine

## 2016-04-10 ENCOUNTER — Encounter (HOSPITAL_COMMUNITY): Payer: Self-pay | Admitting: Emergency Medicine

## 2016-04-10 ENCOUNTER — Ambulatory Visit (HOSPITAL_COMMUNITY)
Admission: EM | Admit: 2016-04-10 | Discharge: 2016-04-10 | Disposition: A | Discharge status: Home / Self Care | Payer: Medicare HMO | Attending: Family Medicine | Admitting: Family Medicine

## 2016-04-10 DIAGNOSIS — R05 Cough: Secondary | ICD-10-CM

## 2016-04-10 DIAGNOSIS — R059 Cough, unspecified: Secondary | ICD-10-CM

## 2016-04-10 DIAGNOSIS — J0100 Acute maxillary sinusitis, unspecified: Secondary | ICD-10-CM | POA: Diagnosis not present

## 2016-04-10 MED ORDER — AMOXICILLIN-POT CLAVULANATE 875-125 MG PO TABS: 1.0000 | ORAL_TABLET | Freq: Two times a day (BID) | ORAL | 0 refills | Status: DC

## 2016-04-10 MED ORDER — BENZONATATE 100 MG PO CAPS: 100.0000 mg | ORAL_CAPSULE | Freq: Three times a day (TID) | ORAL | 0 refills | Status: DC | PRN

## 2016-04-10 NOTE — ED Triage Notes (Signed)
The patient presented to the [facility] with a complaint of sinus pain and pressure as well as a cough and congestion 1 week.

## 2016-04-10 NOTE — ED Provider Notes (Signed)
[place]    CSN: [id number] Arrival date & time: 04/10/16  1509     History   Chief Complaint Chief Complaint  Patient presents with  . Facial Pain    HPI [name] [name] is a 66 y.o. male.   The patient presented to the [facility] with a complaint of sinus pain and pressure as well as a cough and congestion 1 week.  On further questioning, patient states that he's had sinus congestion for over a month and tried to be taking over-the-counter medicines which helped a little bit on to see his symptoms worsen over the last week. He's had a cough with this facial pressure.      Past Medical History:  Diagnosis Date  . Arthritis    "left hip; right knee" (10/09/2013)  . Asthma    'as a child"   . Blood clotting disorder (HCC)    pt. states his family has a history of "slow clotting", had bleeding after oral surgery age 57 but none with extractions 2015 or adult surgeries; thought father (now deceased) had a hx of hemophilia  . Brain aneurysm 01/11/1989  . Brain aneurysm   . CHF (congestive heart failure) ([place])   . Daily headache    are due to the medication he is taking  . Full dentures   . History of gout   . Hypertension   . Shortness of breath    "comes on at anytime" (01/18/2013)  . Sickle cell trait ([place])   . Stroke ([place]) 12/1988  . Systolic heart failure (HCC)    EF is 35 to 40%  . Wears glasses     Patient Active Problem List   Diagnosis Date Noted  . Trigger finger 07/15/2015  . OSA (obstructive sleep apnea) 03/21/2014  . Differential blood pressure between extremities 12/16/2013  . Sensorineural hearing loss of both ears 11/14/2013  . NS (nuclear sclerosis) 11/01/2013  . Conjunctival pterygium 11/01/2013  . History of CVA (cerebrovascular accident) 09/11/2013  . Anger 09/11/2013  . Osteoarthritis 05/25/2013  . Gout 05/25/2013  . Hyperglycemia 05/25/2013  . Prostate cancer screening 05/25/2013  . Preventative health care 05/25/2013  .  Hyperlipidemia 05/25/2013  . Chronic systolic CHF (congestive heart failure) ([place]) 03/25/2013  . Eczema 03/25/2013  . HTN (hypertension) 01/18/2013  . CKD (chronic kidney disease) stage 2, GFR 60-89 ml/min 01/18/2013    Past Surgical History:  Procedure Laterality Date  . CARDIAC CATHETERIZATION  12/1988; 12/2012  . COLONOSCOPY WITH PROPOFOL N/A 09/04/2014   Procedure: COLONOSCOPY WITH PROPOFOL;  Surgeon: [name], MD;  Location: [facility];  Service: Endoscopy;  Laterality: N/A;  . [place]  . HERNIA REPAIR  2004?   "above the navel"   . LEFT AND RIGHT HEART CATHETERIZATION WITH CORONARY ANGIOGRAM N/A 01/21/2013   Procedure: LEFT AND RIGHT HEART CATHETERIZATION WITH CORONARY ANGIOGRAM;  Surgeon: Minus Breeding, MD;  Location: [facility] CATH LAB;  Service: Cardiovascular;  Laterality: N/A;  . MULTIPLE TOOTH EXTRACTIONS  spring 2015   "took out 7"  . MUSCLE BIOPSY N/A 12/06/2013   Procedure: FAT PAD BIOPSY;  Surgeon: Erroll Luna, MD;  Location: [place];  Service: General;  Laterality: N/A;  . NM PET  LYMPHOMA INITIAL  2004?   "benign"  . TOTAL HIP ARTHROPLASTY Left 10/08/2013   anterior approach  . TOTAL HIP ARTHROPLASTY Left 10/08/2013   Procedure: TOTAL HIP ARTHROPLASTY ANTERIOR APPROACH;  Surgeon: Renette Butters, MD;  Location: [place];  Service: Orthopedics;  Laterality: Left;       Home Medications    Prior to Admission medications   Medication Sig Start Date End Date Taking? Authorizing Provider  Acetaminophen 500 MG TBDP Take 1,000 mg by mouth every 6 (six) hours as needed (for moderate pain).    Historical Provider, MD  allopurinol (ZYLOPRIM) 100 MG tablet TAKE 2 TABLETS (200 MG TOTAL) BY MOUTH DAILY. 03/28/16   Holley Raring, MD  amoxicillin-clavulanate (AUGMENTIN) 875-125 MG tablet Take 1 tablet by mouth every 12 (twelve) hours. 04/10/16   Robyn Haber, MD  atorvastatin (LIPITOR) 20 MG tablet Take 1 tablet (20 mg total) by mouth daily at 6  PM. 01/28/16   Thayer Headings, MD  benzonatate (TESSALON) 100 MG capsule Take 1-2 capsules (100-200 mg total) by mouth 3 (three) times daily as needed for cough. 04/10/16   Robyn Haber, MD  carvedilol (COREG) 25 MG tablet TAKE 1 TABLET BY MOUTH TWICE DAILY WITH A MEAL 01/28/16   Thayer Headings, MD  furosemide (LASIX) 40 MG tablet Take 1.5 tablets (60 mg) by mouth twice a day 01/28/16   Thayer Headings, MD  hydrALAZINE (APRESOLINE) 50 MG tablet Take 1 tablet (50 mg total) by mouth 3 (three) times daily. 01/28/16   Thayer Headings, MD  lisinopril (PRINIVIL,ZESTRIL) 20 MG tablet Take 1 tablet (20 mg total) by mouth daily. 01/28/16   Thayer Headings, MD  Magnesium 250 MG TABS Take 250 mg by mouth daily.    Historical Provider, MD  spironolactone (ALDACTONE) 25 MG tablet Take 1 tablet (25 mg total) by mouth daily. 01/28/16   Thayer Headings, MD    Family History Family History  Problem Relation Age of Onset  . Heart attack Mother   . Hypertension Mother   . Varicose Veins Mother   . Cancer Father   . Hypertension Father   . Bleeding Disorder Father   . Hypertension Sister   . Cancer Brother   . Hypertension Brother   . Hyperlipidemia Brother     Social History Social History  Substance Use Topics  . Smoking status: Never Smoker  . Smokeless tobacco: Never Used  . Alcohol use No     Allergies   Hydrocodone and Hydrocodone-acetaminophen   Review of Systems Review of Systems  Constitutional: Negative.   HENT: Positive for congestion, facial swelling and sinus pressure.   Respiratory: Positive for cough.   Cardiovascular: Positive for leg swelling.  Gastrointestinal: Negative.   Neurological: Negative.      Physical Exam Triage Vital Signs ED Triage Vitals [04/10/16 1615]  Enc Vitals Group     BP (!) 139/51     Pulse Rate 81     Resp 18     Temp 99 F (37.2 C)     Temp Source Oral     SpO2 95 %     Weight      Height      Head Circumference      Peak Flow      Pain  Score 0     Pain Loc      Pain Edu?      Excl. in [place]?    No data found.   Updated Vital Signs BP (!) 139/51 (BP Location: Right Arm)   Pulse 81   Temp 99 F (37.2 C) (Oral)   Resp 18   SpO2 95%    Physical Exam  Constitutional: He is oriented to person, place, and time.  He appears well-developed and well-nourished.  HENT:  Right Ear: External ear normal.  Left Ear: External ear normal.  Mouth/Throat: Oropharynx is clear and moist.  Eyes: Conjunctivae and EOM are normal. Pupils are equal, round, and reactive to light.  Neck: Normal range of motion. Neck supple.  Cardiovascular: Normal rate, regular rhythm and normal heart sounds.   Pulmonary/Chest: Effort normal and breath sounds normal.  Musculoskeletal: Normal range of motion.  Neurological: He is alert and oriented to person, place, and time.  Skin: Skin is warm and dry.  Nursing note and vitals reviewed.    UC Treatments / Results  Labs (all labs ordered are listed, but only abnormal results are displayed) Labs Reviewed - No data to display  EKG  EKG Interpretation None       Radiology No results found.  Procedures Procedures (including critical care time)  Medications Ordered in UC Medications - No data to display   Initial Impression / Assessment and Plan / UC Course  I have reviewed the triage vital signs and the nursing notes.  Pertinent labs & imaging results that were available during my care of the patient were reviewed by me and considered in my medical decision making (see chart for details).     Final Clinical Impressions(s) / UC Diagnoses   Final diagnoses:  Acute maxillary sinusitis, recurrence not specified  Cough    New Prescriptions New Prescriptions   AMOXICILLIN-CLAVULANATE (AUGMENTIN) 875-125 MG TABLET    Take 1 tablet by mouth every 12 (twelve) hours.   BENZONATATE (TESSALON) 100 MG CAPSULE    Take 1-2 capsules (100-200 mg total) by mouth 3 (three) times daily as needed  for cough.     Robyn Haber, MD 04/10/16 [phone]

## 2016-05-12 ENCOUNTER — Emergency Department (HOSPITAL_COMMUNITY)
Admission: EM | Admit: 2016-05-12 | Discharge: 2016-05-12 | Disposition: A | Discharge status: Home / Self Care | Payer: Medicare HMO | Attending: Emergency Medicine | Admitting: Emergency Medicine

## 2016-05-12 ENCOUNTER — Encounter (HOSPITAL_COMMUNITY): Payer: Self-pay | Admitting: Emergency Medicine

## 2016-05-12 DIAGNOSIS — I130 Hypertensive heart and chronic kidney disease with heart failure and stage 1 through stage 4 chronic kidney disease, or unspecified chronic kidney disease: Secondary | ICD-10-CM | POA: Insufficient documentation

## 2016-05-12 DIAGNOSIS — N182 Chronic kidney disease, stage 2 (mild): Secondary | ICD-10-CM | POA: Diagnosis not present

## 2016-05-12 DIAGNOSIS — Y9389 Activity, other specified: Secondary | ICD-10-CM | POA: Insufficient documentation

## 2016-05-12 DIAGNOSIS — Y92410 Unspecified street and highway as the place of occurrence of the external cause: Secondary | ICD-10-CM | POA: Insufficient documentation

## 2016-05-12 DIAGNOSIS — I5022 Chronic systolic (congestive) heart failure: Secondary | ICD-10-CM | POA: Diagnosis not present

## 2016-05-12 DIAGNOSIS — S199XXA Unspecified injury of neck, initial encounter: Secondary | ICD-10-CM | POA: Diagnosis present

## 2016-05-12 DIAGNOSIS — Y999 Unspecified external cause status: Secondary | ICD-10-CM | POA: Insufficient documentation

## 2016-05-12 DIAGNOSIS — Z8673 Personal history of transient ischemic attack (TIA), and cerebral infarction without residual deficits: Secondary | ICD-10-CM | POA: Diagnosis not present

## 2016-05-12 DIAGNOSIS — J45909 Unspecified asthma, uncomplicated: Secondary | ICD-10-CM | POA: Insufficient documentation

## 2016-05-12 DIAGNOSIS — Z96642 Presence of left artificial hip joint: Secondary | ICD-10-CM | POA: Insufficient documentation

## 2016-05-12 DIAGNOSIS — S161XXA Strain of muscle, fascia and tendon at neck level, initial encounter: Secondary | ICD-10-CM | POA: Insufficient documentation

## 2016-05-12 MED ORDER — METHOCARBAMOL 500 MG PO TABS: 500.0000 mg | ORAL_TABLET | Freq: Two times a day (BID) | ORAL | 0 refills | Status: DC
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
## 2016-05-12 NOTE — Discharge Instructions (Signed)
Take your medications as prescribed. I also recommend applying ice and/or heat to affected area for 15-20 minutes 3-4 times daily for additional pain relief. Refrain from doing any heavy lifting, squatting or repetitive movements that exacerbate your symptoms. Follow-up with your primary care provider in the next week if her symptoms have not improved.  Please return to the Emergency Department if symptoms worsen or new onset of fever, headache, neck stiffness, back pain, numbness, weakness, groin numbness, loss of control of bowel or bladder, chest pain, difficulty breathing, abdominal pain.
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
## 2016-05-26 ENCOUNTER — Encounter: Payer: Medicare HMO | Admitting: Internal Medicine

## 2016-05-26 ENCOUNTER — Encounter: Payer: Self-pay | Admitting: Internal Medicine

## 2016-06-02 ENCOUNTER — Encounter: Payer: Self-pay | Admitting: Cardiovascular Disease

## 2016-06-02 ENCOUNTER — Ambulatory Visit (INDEPENDENT_AMBULATORY_CARE_PROVIDER_SITE_OTHER): Payer: Medicare HMO | Admitting: Cardiovascular Disease

## 2016-06-02 VITALS — BP 142/70 | HR 64 | Ht 76.0 in | Wt 270.4 lb

## 2016-06-02 DIAGNOSIS — I10 Essential (primary) hypertension: Secondary | ICD-10-CM | POA: Diagnosis not present

## 2016-06-02 DIAGNOSIS — I5022 Chronic systolic (congestive) heart failure: Secondary | ICD-10-CM | POA: Diagnosis not present

## 2016-06-02 DIAGNOSIS — E782 Mixed hyperlipidemia: Secondary | ICD-10-CM
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
## 2016-06-02 NOTE — Patient Instructions (Signed)

## 2016-06-03 ENCOUNTER — Telehealth: Payer: Self-pay | Admitting: Cardiovascular Disease

## 2016-06-03 NOTE — Telephone Encounter (Signed)
New message     Please call pt he as a question on the procedures that were done yesterday ?

## 2016-06-03 NOTE — Telephone Encounter (Signed)
LMTCB

## 2016-06-03 NOTE — Telephone Encounter (Signed)
Spoke with the pt.  He was only calling to ask advice for a friend who is visiting from the [place], and he thinks he has a heart condition.  Pt calling to ask recommendations on where he should go to see a MD for his "heart issues." Advised the pt that being his friend has no PCP and is only visiting, if he is acute he could take him too the ER for further evaluation, and if he's not acute and stable, he could take him to Urgent Care. Pt verbalized understanding and agrees with this plan.  Pt gracious for all the assistance provided.

## 2016-06-03 NOTE — Telephone Encounter (Signed)
New Message ° ° pt verbalized that he is returning call for rn  °

## 2016-07-01 ENCOUNTER — Encounter: Payer: Self-pay | Admitting: *Deleted

## 2016-07-14 ENCOUNTER — Encounter: Payer: Self-pay | Admitting: Internal Medicine

## 2016-07-14 ENCOUNTER — Ambulatory Visit (INDEPENDENT_AMBULATORY_CARE_PROVIDER_SITE_OTHER): Payer: Medicare HMO | Admitting: Internal Medicine

## 2016-07-14 VITALS — BP 144/84 | HR 67 | Temp 98.3°F | Ht 76.0 in | Wt 269.8 lb

## 2016-07-14 DIAGNOSIS — E669 Obesity, unspecified: Secondary | ICD-10-CM | POA: Diagnosis not present

## 2016-07-14 DIAGNOSIS — E7800 Pure hypercholesterolemia, unspecified: Secondary | ICD-10-CM

## 2016-07-14 DIAGNOSIS — Z79899 Other long term (current) drug therapy: Secondary | ICD-10-CM

## 2016-07-14 DIAGNOSIS — M1612 Unilateral primary osteoarthritis, left hip: Secondary | ICD-10-CM | POA: Diagnosis not present

## 2016-07-14 DIAGNOSIS — I130 Hypertensive heart and chronic kidney disease with heart failure and stage 1 through stage 4 chronic kidney disease, or unspecified chronic kidney disease: Secondary | ICD-10-CM

## 2016-07-14 DIAGNOSIS — E785 Hyperlipidemia, unspecified: Secondary | ICD-10-CM

## 2016-07-14 DIAGNOSIS — M159 Polyosteoarthritis, unspecified: Secondary | ICD-10-CM

## 2016-07-14 DIAGNOSIS — Z8249 Family history of ischemic heart disease and other diseases of the circulatory system: Secondary | ICD-10-CM | POA: Diagnosis not present

## 2016-07-14 DIAGNOSIS — I10 Essential (primary) hypertension: Secondary | ICD-10-CM

## 2016-07-14 DIAGNOSIS — G4733 Obstructive sleep apnea (adult) (pediatric): Secondary | ICD-10-CM

## 2016-07-14 DIAGNOSIS — Z809 Family history of malignant neoplasm, unspecified: Secondary | ICD-10-CM

## 2016-07-14 DIAGNOSIS — Z6832 Body mass index (BMI) 32.0-32.9, adult: Secondary | ICD-10-CM | POA: Diagnosis not present

## 2016-07-14 DIAGNOSIS — Z832 Family history of diseases of the blood and blood-forming organs and certain disorders involving the immune mechanism: Secondary | ICD-10-CM

## 2016-07-14 DIAGNOSIS — I5022 Chronic systolic (congestive) heart failure: Secondary | ICD-10-CM | POA: Diagnosis not present

## 2016-07-14 DIAGNOSIS — N182 Chronic kidney disease, stage 2 (mild): Secondary | ICD-10-CM | POA: Diagnosis not present
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
## 2016-07-14 NOTE — Assessment & Plan Note (Signed)
Blood pressure is currently well-controlled although it is slightly high today in our office. Patient is compliant with all of his medications. Patient denies any chest pain, headaches, vision changes. He does have significant LVH which likely related to chronic hypertension.  Assessment/Plan: Well-controlled hypertension slightly elevated in clinic today, would not recommend any medication changes currently. Continue spironolactone, lisinopril, hydralazine, carvedilol, furosemide
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
## 2016-07-14 NOTE — Assessment & Plan Note (Signed)
Continue atorvastatin for ASCVD risk reduction in the setting of heart failure and hyperlipidemia.

## 2016-07-14 NOTE — Assessment & Plan Note (Signed)
Patient is a history of obstructive sleep apnea was previously on CPAP. He has not been using this for several years due to issues with insurance coverage. Patient is now on Medicare. Given his heart failure, hypertension, and obesity, patient would be a good candidate for resuming his CPAP as he would likely fine multifactorial benefit. Patient also describes difficulty with feeling tired occasionally and difficulty sleeping which may be attributed to sleep apnea. Patient is agreeable to repeating polysomnography and referrals were placed today.  Assessment: Undertreated obstructive sleep apnea  Plan: Repeat polysomnography and reinitiate CPAP therapy.

## 2016-07-14 NOTE — Assessment & Plan Note (Signed)
Patient has mild CKD related to hypertension. Last laboratory evaluation was in January. Patient denies any symptomatic changes would recommend repeat lab work in 6 months at his next visit.
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
## 2016-07-17 NOTE — Progress Notes (Signed)
Internal Medicine Clinic Attending  Case discussed with Dr. Strelow at the time of the visit.  We reviewed the resident's history and exam and pertinent patient test results.  I agree with the assessment, diagnosis, and plan of care documented in the resident's note.  

## 2016-09-14 ENCOUNTER — Ambulatory Visit (INDEPENDENT_AMBULATORY_CARE_PROVIDER_SITE_OTHER): Payer: Medicare HMO | Admitting: Internal Medicine

## 2016-09-14 VITALS — BP 140/87 | HR 64 | Temp 98.3°F | Ht 76.0 in | Wt 268.3 lb

## 2016-09-14 DIAGNOSIS — Z79899 Other long term (current) drug therapy: Secondary | ICD-10-CM

## 2016-09-14 DIAGNOSIS — R519 Headache, unspecified: Secondary | ICD-10-CM | POA: Insufficient documentation

## 2016-09-14 DIAGNOSIS — R51 Headache: Secondary | ICD-10-CM

## 2016-09-14 NOTE — Progress Notes (Signed)
   CC: Headache  HPI:  [name] is a 66 y.o. male with PMH listed below who presents to clinic for headache. Please see problem based assessment and plan for further details.  Past Medical History:  Diagnosis Date  . Arthritis    "left hip; right knee" (10/09/2013)  . Asthma    'as a child"   . Blood clotting disorder (HCC)    pt. states his family has a history of "slow clotting", had bleeding after oral surgery age 5 but none with extractions 2015 or adult surgeries; thought father (now deceased) had a hx of hemophilia  . Brain aneurysm 01/11/1989  . Brain aneurysm   . CHF (congestive heart failure) ([place])   . Daily headache    are due to the medication he is taking  . Full dentures   . History of gout   . Hypertension   . Shortness of breath    "comes on at anytime" (01/18/2013)  . Sickle cell trait ([place])   . Stroke ([place]) 12/1988  . Systolic heart failure (HCC)    EF is 35 to 40%  . Wears glasses    Review of Systems:   Review of Systems  Constitutional: Negative for chills, fever, malaise/fatigue and weight loss.  Eyes: Negative for blurred vision, double vision, photophobia, pain, discharge and redness.  Respiratory: Negative for cough, shortness of breath and wheezing.   Cardiovascular: Negative for chest pain, palpitations, orthopnea, leg swelling and PND.  Neurological: Negative for dizziness, sensory change, focal weakness, loss of consciousness, weakness and headaches.    Physical Exam:  Vitals:   09/14/16 1358  BP: 140/87  Pulse: 64  Temp: 98.3 F (36.8 C)  TempSrc: Oral  SpO2: 98%  Weight: 268 lb 4.8 oz (121.7 kg)  Height: 6\' 4"  (1.93 m)   General: pleasant, Well-nourished, well-developed, no acute distress Cardiac: regular rate and rhythm, nl S1/S2, no murmurs, rubs or gallops  Pulm: CTAB, no wheezes or crackles, no increased work of breathing  Abd: soft, NTND, bowel sounds present  Neuro: A&Ox3, CN II-XII intact, no focal deficits  noted Ext: warm and well perfused, no peripheral edema      Assessment & Plan:   See Encounters Tab for problem based charting.  Patient seen with Dr. Daryll Drown

## 2016-09-14 NOTE — Patient Instructions (Signed)
It was nice to meet you today, [name] [name]. He was seen today for a headache.  We did not make any adjustments in your blood pressure medicines today. I will get in touch with her cardiologist and discuss future changes in your medications. I will call you if we decide to make changes in your medications.  Please call or make an appointment at [facility] clinic if your headache worsens or if you start to experience symptoms such as new weakness, blurry vision, chest pain, shortness of breath.

## 2016-09-14 NOTE — Assessment & Plan Note (Addendum)
Patient presenting for new onset headache that started 2 weeks ago. Headache is constant in nature, does not progressively worsens, localized to parietal lobes, and only occurs during the morning time. It is not associated with nausea, vomiting, photophobia, or phonophobia. Does not wake him from sleep. He denies history of migraines in the past. Reports taking 2 tablets of Tylenol 500 mg for the first few days with minimal relief. States he has been taking his blood pressure at home in the morning and it has been in the 170s. He is currently on Coreg 25 mg BID, hydralazine 50 mg TID, lisinopril 20 mg daily, and spironolactone 25 mg daily and reports compliance. States his headache resolves 2 hours after taking his blood pressure medicine in the morning. Denies fever, chills, changes in vision, and new weakness. Denies HA throughout the rest of the day.   Patient is presenting with new onset headache not associated with neurological deficits or systemic symptoms in the setting of elevated blood pressure. Plan to increase his morning and nighttime hydralazine dose from 50 mg to 100 mg. However, patient wants this to be discussed with his cardiologist (Dr. Acie Fredrickson) prior to making changes in his medications.  - We will continue his current regimen for hypertension. - I have sent a message to Dr. Acie Fredrickson   Per Dr. Acie Fredrickson, patient's BP well controlled on last visit 1 month ago. He agrees with change in Hydralazine dose. Called patient to update him. Instructed him to take 2 tablets of Hydralazine 50mg  in AM and 2 tablets before bedtime, and continue to take 1 tablet at noon.

## 2016-09-19 ENCOUNTER — Telehealth: Payer: Self-pay | Admitting: Cardiovascular Disease

## 2016-09-19 MED ORDER — HYDRALAZINE HCL 50 MG PO TABS: ORAL_TABLET | ORAL | 3 refills | Status: DC

## 2016-09-19 MED ORDER — FUROSEMIDE 40 MG PO TABS: ORAL_TABLET | ORAL | 3 refills | Status: DC

## 2016-09-19 NOTE — Telephone Encounter (Signed)
Pt called with several b/p readings all which looked good except early am readings prior to taking b/p meds Per pt pmd increased hydralazine to 100 mg am 50 mg afternoon and 100 mg hs  And per pt is only taking Furosemide 60 mg daily not bid as pt has had some incontinence issues and does not want to take bid .B/p  readings prior to  meds are 166/86 188/104 175/95 144/79 and this am 168/98 .Will forward to Dr Acie Fredrickson for review .Adonis Housekeeper

## 2016-09-19 NOTE — Telephone Encounter (Signed)
New message   Pt c/o BP issue: STAT if pt c/o blurred vision, one-sided weakness or slurred speech  1. What are your last 5 BP readings? 175/95, 143/71, 123/70, 130/72, 146/84, 144/79, 168/98, 158/95  2. Are you having any other symptoms (ex. Dizziness, headache, blurred vision, passed out)? no  3. What is your BP issue? Rapid heart rate and bp high

## 2016-09-20 NOTE — Telephone Encounter (Signed)
Left message for patient to call back  

## 2016-09-20 NOTE — Telephone Encounter (Signed)
Overall, his BP readings look good Will discuss further at his next office visit Please remind him to avoid salt

## 2016-09-20 NOTE — Progress Notes (Signed)
Internal Medicine Clinic Attending  I saw and evaluated the patient.  I personally confirmed the key portions of the history and exam documented by Dr. Santos-Sanchez and I reviewed pertinent patient test results.  The assessment, diagnosis, and plan were formulated together and I agree with the documentation in the resident's note. 

## 2016-09-28 ENCOUNTER — Encounter: Payer: Self-pay | Admitting: Physician Assistant

## 2016-09-28 ENCOUNTER — Telehealth: Payer: Self-pay | Admitting: *Deleted

## 2016-09-28 ENCOUNTER — Ambulatory Visit (INDEPENDENT_AMBULATORY_CARE_PROVIDER_SITE_OTHER): Payer: Medicare HMO | Admitting: Physician Assistant

## 2016-09-28 VITALS — BP 82/40 | HR 65 | Ht 76.0 in | Wt 264.8 lb

## 2016-09-28 DIAGNOSIS — N182 Chronic kidney disease, stage 2 (mild): Secondary | ICD-10-CM

## 2016-09-28 DIAGNOSIS — G4733 Obstructive sleep apnea (adult) (pediatric): Secondary | ICD-10-CM

## 2016-09-28 DIAGNOSIS — E7800 Pure hypercholesterolemia, unspecified: Secondary | ICD-10-CM

## 2016-09-28 DIAGNOSIS — I110 Hypertensive heart disease with heart failure: Secondary | ICD-10-CM

## 2016-09-28 DIAGNOSIS — I5042 Chronic combined systolic (congestive) and diastolic (congestive) heart failure: Secondary | ICD-10-CM | POA: Diagnosis not present

## 2016-09-28 DIAGNOSIS — I2510 Atherosclerotic heart disease of native coronary artery without angina pectoris: Secondary | ICD-10-CM | POA: Insufficient documentation

## 2016-09-28 LAB — CBC
Hematocrit: 40.5 % (ref 37.5–51.0)
Hemoglobin: 13.8 g/dL (ref 13.0–17.7)
MCH: 32.5 pg (ref 26.6–33.0)
MCHC: 34.1 g/dL (ref 31.5–35.7)
MCV: 95 fL (ref 79–97)
PLATELETS: 268 10*3/uL (ref 150–379)
RBC: 4.25 x10E6/uL (ref 4.14–5.80)
RDW: 12.7 % (ref 12.3–15.4)
WBC: 12 10*3/uL — ABNORMAL HIGH (ref 3.4–10.8)

## 2016-09-28 LAB — BASIC METABOLIC PANEL
BUN / CREAT RATIO: 23 (ref 10–24)
BUN: 39 mg/dL — ABNORMAL HIGH (ref 8–27)
CHLORIDE: 98 mmol/L (ref 96–106)
CO2: 23 mmol/L (ref 20–29)
Calcium: 9.8 mg/dL (ref 8.6–10.2)
Creatinine, Ser: 1.73 mg/dL — ABNORMAL HIGH (ref 0.76–1.27)
GFR calc non Af Amer: 41 mL/min/{1.73_m2} — ABNORMAL LOW (ref >59)
GFR, EST AFRICAN AMERICAN: 47 mL/min/{1.73_m2} — AB (ref >59)
Glucose: 104 mg/dL — ABNORMAL HIGH (ref 65–99)
Potassium: 4.7 mmol/L (ref 3.5–5.2)
SODIUM: 136 mmol/L (ref 134–144)

## 2016-09-28 LAB — TSH: TSH: 1.08 u[IU]/mL (ref 0.450–4.500)

## 2016-09-28 MED ORDER — LISINOPRIL 10 MG PO TABS: 10.0000 mg | ORAL_TABLET | Freq: Every day | ORAL | 3 refills | Status: DC

## 2016-09-28 MED ORDER — LISINOPRIL 10 MG PO TABS: 10.0000 mg | ORAL_TABLET | Freq: Two times a day (BID) | ORAL | 3 refills | Status: DC

## 2016-09-28 MED ORDER — HYDRALAZINE HCL 50 MG PO TABS: 75.0000 mg | ORAL_TABLET | Freq: Three times a day (TID) | ORAL | 3 refills | Status: DC

## 2016-09-28 MED ORDER — LISINOPRIL 20 MG PO TABS
10.0000 mg | ORAL_TABLET | Freq: Two times a day (BID) | ORAL | 3 refills | Status: DC
Start: 2016-09-28 — End: 2016-09-28

## 2016-09-28 NOTE — Progress Notes (Signed)
Cardiology Office Note:    Date:  09/28/2016   ID:  [name] [name], DOB 1950/12/20, MRN [id number]  PCP:  [name] Homes, MD  Cardiologist:  Dr. Liam [name]    Referring MD: [name] Homes, MD   Chief Complaint  Patient presents with  . Follow-up    Blood pressure    History of Present Illness:    [name] [name] is a 66 y.o. male with a hx of HTN, asthma, prior stroke, nonischemic cardiomyopathy, combined systolic and diastolic HF last seen by Dr. Acie [name] in 5/18. His EF has been as low as 25-30% by echocardiogram in 2015. Cardiac catheterization in 2014 demonstrated mild nonobstructive CAD with mid RCA 40% stenosis. Most recent echocardiogram in 2/18 demonstrated normal LV function with EF 50-55% and severe LVH.    [name] returns for evaluation of blood pressure. He has recently noted increases in his blood pressure, especially in the morning prior to taking his medications. He has recorded readings as high as 198/112. He has a headache with this. He denies chest discomfort, dyspnea, syncope, PND or edema. He does feel dizzy after taking his medications. He typically notes that his blood pressure runs much lower after taking his medications. He denies syncope or near syncope. His PCP increased his hydralazine 2 weeks ago. His headaches are improved but he continues to note fluctuations in his blood pressure.  Prior CV studies:   The following studies were reviewed today:  Echocardiogram 03/22/16 Severe concentric LVH, EF 50-55, diffuse HK, normal RVSF, trivial AI, trivial TR, PASP 26  Carotid US 10/16/15 Bilateral ICA 1-39; R subclavian velocity increased  Echocardiogram 12/15 Severe LVH, EF 30-35, diffuse HK, grade 1 diastolic dysfunction, trivial AI, mildly dilated aortic root, mildly dilated ascending aorta, mild BAE  R/L Cardiac Catheterization 01/21/13  RCA luminal irregularities, mid 40 Mean PA 42, PCWP 26, LVEDP 29  Echocardiogram 6/15 EF 25-30, PASP 44, Gr 2  DD  Past Medical History:  Diagnosis Date  . Arthritis    "left hip; right knee" (10/09/2013)  . Asthma    'as a child"   . Blood clotting disorder (HCC)    pt. states his family has a history of "slow clotting", had bleeding after oral surgery age 67 but none with extractions 2015 or adult surgeries; thought father (now deceased) had a hx of hemophilia  . Brain aneurysm 01/11/1989  . Brain aneurysm   . CHF (congestive heart failure) ([place])   . Daily headache    are due to the medication he is taking  . Full dentures   . History of gout   . Hypertension   . Shortness of breath    "comes on at anytime" (01/18/2013)  . Sickle cell trait ([place])   . Stroke ([place]) 12/1988  . Systolic heart failure (HCC)    EF is 35 to 40%  . Wears glasses     Past Surgical History:  Procedure Laterality Date  . CARDIAC CATHETERIZATION  12/1988; 12/2012  . COLONOSCOPY WITH PROPOFOL N/A 09/04/2014   Procedure: COLONOSCOPY WITH PROPOFOL;  Surgeon: [name] Banister, MD;  Location: [facility];  Service: Endoscopy;  Laterality: N/A;  . [place]  . HERNIA REPAIR  2004?   "above the navel"   . LEFT AND RIGHT HEART CATHETERIZATION WITH CORONARY ANGIOGRAM N/A 01/21/2013   Procedure: LEFT AND RIGHT HEART CATHETERIZATION WITH CORONARY ANGIOGRAM;  Surgeon: [name] Breeding, MD;  Location: [facility];  Service: Cardiovascular;  Laterality: N/A;  . MULTIPLE  TOOTH EXTRACTIONS  spring 2015   "took out 7"  . MUSCLE BIOPSY N/A 12/06/2013   Procedure: FAT PAD BIOPSY;  Surgeon: [name] Luna, MD;  Location: [facility];  Service: General;  Laterality: N/A;  . NM PET  LYMPHOMA INITIAL  2004?   "benign"  . TOTAL HIP ARTHROPLASTY Left 10/08/2013   anterior approach  . TOTAL HIP ARTHROPLASTY Left 10/08/2013   Procedure: TOTAL HIP ARTHROPLASTY ANTERIOR APPROACH;  Surgeon: [name] Butters, MD;  Location: [facility];  Service: Orthopedics;  Laterality: Left;    Current Medications: Current Meds   Medication Sig  . allopurinol (ZYLOPRIM) 100 MG tablet TAKE 2 TABLETS (200 MG TOTAL) BY MOUTH DAILY.  [name] atorvastatin (LIPITOR) 20 MG tablet Take 1 tablet (20 mg total) by mouth daily at 6 PM.  . carvedilol (COREG) 25 MG tablet TAKE 1 TABLET BY MOUTH TWICE DAILY WITH A MEAL  . furosemide (LASIX) 40 MG tablet Take 40 mg by mouth daily.  [name] spironolactone (ALDACTONE) 25 MG tablet Take 1 tablet (25 mg total) by mouth daily.  . [DISCONTINUED] hydrALAZINE (APRESOLINE) 50 MG tablet TAKE 100 MG TABLET BY MOUTH IN THE MORNING; 50 MG BY MOUTH IN THE AFTERNOON AND 100 MG TABLET BY MOUTH AT BEDTIME  . [DISCONTINUED] lisinopril (PRINIVIL,ZESTRIL) 20 MG tablet Take 1 tablet (20 mg total) by mouth daily.     Allergies:   Hydrocodone and Hydrocodone-acetaminophen   Social History   Social History  . Marital status: Married    Spouse name: [name] [name]  . Number of children: 4  . Years of education: Assoc.   Occupational History  .      not employed   Social History Main Topics  . Smoking status: Never Smoker  . Smokeless tobacco: Never Used  . Alcohol use No  . Drug use: Yes    Types: Marijuana     Comment: "stopped smoking marijuana in the late 1990's"  . Sexual activity: Yes   Other Topics Concern  . None   Social History Narrative   Patient consumes caffeine occas.     Family Hx: The patient's family history includes Bleeding Disorder in his father; Cancer in his brother and father; Heart attack in his mother; Hyperlipidemia in his brother; Hypertension in his brother, father, mother, and sister; Varicose Veins in his mother.  ROS:   Please see the history of present illness.    ROS All other systems reviewed and are negative.   EKGs/Labs/Other Test Reviewed:    EKG:  EKG is  ordered today.  The ekg ordered today demonstrates NSR, HR 65, left axis deviation, nonspecific ST-T wave changes, septal Q waves, QTc 420 ms, no change from prior tracing  Recent Labs: 01/28/2016: ALT  28 09/28/2016: BUN 39; Creatinine, Ser 1.73; Hemoglobin 13.8; Platelets 268; Potassium 4.7; Sodium 136; TSH 1.080   Recent Lipid Panel Lab Results  Component Value Date/Time   CHOL 160 01/28/2016 10:05 AM   TRIG 115 01/28/2016 10:05 AM   HDL 38 (L) 01/28/2016 10:05 AM   CHOLHDL 4.2 01/28/2016 10:05 AM   CHOLHDL 3.3 07/30/2015 08:42 AM   LDLCALC 99 01/28/2016 10:05 AM    Physical Exam:    VS:  BP (!) 82/40   Pulse 65   Ht 6\' 4"  (1.93 m)   Wt 264 lb 12.8 oz (120.1 kg)   SpO2 93%   BMI 32.23 kg/m     Wt Readings from Last 3 Encounters:  09/28/16 264 lb 12.8 oz (  120.1 kg)  09/14/16 268 lb 4.8 oz (121.7 kg)  07/14/16 269 lb 12.8 oz (122.4 kg)     Physical Exam  Constitutional: He is oriented to person, place, and time. He appears well-developed and well-nourished. No distress.  HENT:  Head: Normocephalic and atraumatic.  Eyes: No scleral icterus.  Neck: Normal range of motion. No JVD present.  Cardiovascular: Normal rate, regular rhythm, S1 normal and S2 normal.   No murmur heard. Pulmonary/Chest: Effort normal and breath sounds normal. He has no rhonchi. He has no rales.  Abdominal: Soft. There is no hepatomegaly.  Musculoskeletal: He exhibits no edema.  Neurological: He is alert and oriented to person, place, and time.  Skin: Skin is warm and dry.  Psychiatric: He has a normal mood and affect.    ASSESSMENT:    1. Hypertensive heart disease with chronic combined systolic and diastolic congestive heart failure ([place])   2. Chronic combined systolic and diastolic CHF (congestive heart failure) ([place])   3. Non-obstructive Coronary artery disease by Cardiac Cath in 2014 (mid RCA 40%)   4. Pure hypercholesterolemia   5. OSA (obstructive sleep apnea)    PLAN:    In order of problems listed above:  1. Hypertensive heart disease with chronic combined systolic and diastolic congestive heart failure ([place]) I reviewed his blood pressure readings from home. His pressure in the  morning is typically the highest. He has recorded his pressure as high as 198/112. Later in the day, he has more optimal blood pressures. Today his blood pressure is significantly depressed. He is not significantly symptomatic with this today. He has noted some lightheadedness. He denies a history of syncope. It is not clear to me why he has such wide fluctuations in blood pressure. I think he needs a more stable blood pressure regimen.  -  Decrease hydralazine to 75 mg 3 times a day  -  Change lisinopril to 10 mg twice daily  -  Labs today: BMET, CBC, TSH  -  Hold Lasix and spironolactone today given his hypotension, resume tomorrow  -  Continue to monitor blood pressure and bring readings to next visit  -  Follow-up with PCP for treatment of sleep apnea  2. Chronic combined systolic and diastolic CHF (congestive heart failure) (HCC)  Recent echo with normal LV function. Volume appears stable. Continue beta blocker, hydralazine, ACE inhibitor, spironolactone.  3. Non-obstructive Coronary artery disease by Cardiac Cath in 2014 (mid RCA 40%) No angina. Continue statin therapy.  4. Pure hypercholesterolemia Continue statin.  5. OSA (obstructive sleep apnea) He has been intolerant of CPAP. Untreated sleep apnea is likely contributing to his high blood pressure in the mornings. This is also likely contributing to his headaches. I have asked him to follow-up with his primary care provider to arrange follow-up sleep testing.   Dispo:  Return in about 3 weeks (around 10/19/2016) for Close Follow Up, w/ [name], PA-C.   Medication Adjustments/Labs and Tests Ordered: Current medicines are reviewed at length with the patient today.  Concerns regarding medicines are outlined above.  Tests Ordered: Orders Placed This Encounter  Procedures  . Basic Metabolic Panel (BMET)  . CBC  . TSH  . EKG 12-Lead   Medication Changes: Meds ordered this encounter  Medications  . hydrALAZINE (APRESOLINE)  50 MG tablet    Sig: Take 1.5 tablets (75 mg total) by mouth 3 (three) times daily.    Dispense:  270 tablet    Refill:  3  .  DISCONTD: lisinopril (PRINIVIL,ZESTRIL) 20 MG tablet    Sig: Take 0.5 tablets (10 mg total) by mouth 2 (two) times daily.    Dispense:  180 tablet    Refill:  3  . DISCONTD: lisinopril (PRINIVIL,ZESTRIL) 10 MG tablet    Sig: Take 1 tablet (10 mg total) by mouth daily.    Dispense:  90 tablet    Refill:  3  . lisinopril (PRINIVIL,ZESTRIL) 10 MG tablet    Sig: Take 1 tablet (10 mg total) by mouth 2 (two) times daily.    Dispense:  180 tablet    Refill:  3    Signed, [name], PA-C  09/28/2016 [facility] [address], [place], [place]  [address] Phone: [phone]; Fax: [phone]

## 2016-09-28 NOTE — Telephone Encounter (Signed)
Pt has been notified of lab results and findings by phone with verbal understanding. Pt aware to continue with plan as discussed at [place] today. Pt is agreeable to repeat BMET 10/06/16. Pt advised to f/u with PCP for Leukocytosis.

## 2016-09-28 NOTE — Patient Instructions (Addendum)
Medication Instructions:  1. INCREASE HYDRALAZINE TO 75 MG THREE TIMES A DAY; (YOU WILL TAKE 1 AND 1/2 TABLETS THREE TIMES A DAY)  2. DECREASE LISINOPRIL 20 MG TO TAKE 1/2 TABLET DAILY = 10 MG TWICE DAILY, NEW RX HAS BEEN SENT IN FOR THE 10 MG TABLET PER PT REQUEST  3. HOLD LASIX AND SPIRONOLACTONE TODAY ONLY; RESUME BOTH LASIX AND SPIRONOLACTONE TOMORROW 09/29/16  Labwork: TODAY BMET, CBC, TSH  Testing/Procedures: NONE ORDERED TODAY  Follow-Up: 10/18/16 @ 11:15 WITH SCOTT WEAVER, PAC   Any Other Special Instructions Will Be Listed Below (If Applicable).  MAKE SURE TO FOLLOW UP WITH PRIMARY CARE IN REGARDS TO SLEEP STUDY  CHECK BLOOD PRESSURE 1-2 HOURS AFTER YOU HAVE TAKEN YOUR BLOOD PRESSURE MEDICATIONS, PLEASE BRING READINGS TO NEXT OFFICE VISIT    If you need a refill on your cardiac medications before your next appointment, please call your pharmacy.

## 2016-09-28 NOTE — Telephone Encounter (Signed)
-----   Message from Liliane Shi, [place] sent at 09/28/2016  4:46 PM EDT ----- Please call the patient Creatinine increased >> hold Lasix and spironolactone as outlined office visit today, resume Lasix and spironolactone tomorrow Hemoglobin normal WBC elevated - unchanged from 2017 >> follow-up with PCP for leukocytosis TSH normal Repeat BMET 1 week. [name], PA-C    09/28/2016 4:45 PM

## 2016-10-04 ENCOUNTER — Other Ambulatory Visit: Payer: Self-pay

## 2016-10-04 NOTE — Telephone Encounter (Signed)
allopurinol (ZYLOPRIM) 100 MG tablet, refill request @ [facility] on [address].

## 2016-10-06 ENCOUNTER — Other Ambulatory Visit: Payer: Medicare HMO | Admitting: *Deleted

## 2016-10-06 DIAGNOSIS — N182 Chronic kidney disease, stage 2 (mild): Secondary | ICD-10-CM

## 2016-10-06 DIAGNOSIS — I5042 Chronic combined systolic (congestive) and diastolic (congestive) heart failure: Secondary | ICD-10-CM

## 2016-10-06 LAB — BASIC METABOLIC PANEL
BUN / CREAT RATIO: 24 (ref 10–24)
BUN: 42 mg/dL — ABNORMAL HIGH (ref 8–27)
CO2: 26 mmol/L (ref 20–29)
CREATININE: 1.73 mg/dL — AB (ref 0.76–1.27)
Calcium: 9.8 mg/dL (ref 8.6–10.2)
Chloride: 99 mmol/L (ref 96–106)
GFR, EST AFRICAN AMERICAN: 47 mL/min/{1.73_m2} — AB (ref >59)
GFR, EST NON AFRICAN AMERICAN: 41 mL/min/{1.73_m2} — AB (ref >59)
Glucose: 81 mg/dL (ref 65–99)
POTASSIUM: 4.6 mmol/L (ref 3.5–5.2)
SODIUM: 139 mmol/L (ref 134–144)

## 2016-10-06 MED ORDER — ALLOPURINOL 100 MG PO TABS: 200.0000 mg | ORAL_TABLET | Freq: Every day | ORAL | 1 refills | Status: DC

## 2016-10-06 NOTE — Telephone Encounter (Signed)
Creatinine function >30. No need for dose adjustment. Refill sent.

## 2016-10-07 ENCOUNTER — Telehealth: Payer: Self-pay | Admitting: *Deleted

## 2016-10-07 NOTE — Telephone Encounter (Signed)
Pt has been notified of lab results by phone. Went going over medications changes with pt he states to me that he has been taking lasix 60 mg BID.   I read note from 09/19/16 from Dr. Acie Fredrickson: [name] Campbell, LPN  Pt called with several b/p readings all which looked good except early am readings prior to taking b/p meds Per pt pmd increased hydralazine to 100 mg am 50 mg afternoon and 100 mg hs  And per pt is only taking Furosemide 60 mg daily not bid as pt has had some incontinence issues and does not want to take bid .B/p  readings prior to  meds are 166/86 188/104 175/95 144/79 and this am 168/98 .Will forward to Dr Acie Fredrickson for review ./cy  Pt states he made a decision to go back to lasix 60 mg BID like he originally had been taking. I advised pt that from Dr. Acie Fredrickson note 09/19/16 looks like his nurse [name] [name] tried to call him on 09/20/16 though pt did not return call to nurse. I advised pt to not make adjustments with his medications w/o s/w his doctors office first. I advised pt to follow recommendations per [name] Dopp, PA decrease lasix to 20 mg daily as well as decrease spironolactone to 12.5 mg daily, bmet when he see's [name] W. PA 10/18/16. Advised pt monitor his weight daily and call if his weight is up 3 lb's x 1 day or 5 lb's x 1 week to please call the office. Pt is agreeable to plan of care.

## 2016-10-07 NOTE — Telephone Encounter (Signed)
-----   Message from Liliane Shi, [place] sent at 10/07/2016  9:06 AM EDT ----- Please call the patient Kidney function is stable. Since BP was recently running low and the creatinine had increased, I would like him to reduce the dose of Lasix and Spironolactone. PLAN:  1. Decrease Lasix to 20 mg QD 2. Decrease Spironolactone to 12.5 mg QD 3. BMET in 2 weeks.  [name], PA-C    10/07/2016 9:06 AM

## 2016-10-07 NOTE — Telephone Encounter (Signed)
Lmtcb to go over new recommendations per [name], PA.

## 2016-10-07 NOTE — Telephone Encounter (Signed)
-----   Message from Liliane Shi, [place] sent at 10/07/2016  1:54 PM EDT ----- If he has been taking Lasix 60 mg Twice daily all along >> Decrease Lasix to 60 mg Once daily  Continue current dose of Spironolactone (25 mg Once daily) Weigh daily and call if weight increase > 3 lbs in 1 day BMET in 2 weeks. [name], PA-C    10/07/2016 1:54 PM

## 2016-10-07 NOTE — Telephone Encounter (Signed)
I went over new instructions about lasix and spironolactone with the pt. Pt will take lasix 60 mg once a day and spironolactone 25 mg daily. Advised pt these are new directions from when we spoke earlier. Pt is agreeable to plan of care and will now follow lasix 60 mg once a day and spironolactone 25 mg daily. Pt thanked me for our help. Pt is aware to monitor weight and call the office if weight is up 3 lb's x 1 day.

## 2016-10-07 NOTE — Telephone Encounter (Signed)
Follow Up ° ° ° °Returning call from earlier. Please call. °

## 2016-10-18 ENCOUNTER — Encounter: Payer: Self-pay | Admitting: Physician Assistant

## 2016-10-18 ENCOUNTER — Ambulatory Visit (INDEPENDENT_AMBULATORY_CARE_PROVIDER_SITE_OTHER): Payer: Medicare HMO | Admitting: Physician Assistant

## 2016-10-18 VITALS — BP 128/74 | HR 70 | Ht 76.0 in | Wt 270.0 lb

## 2016-10-18 DIAGNOSIS — I110 Hypertensive heart disease with heart failure: Secondary | ICD-10-CM | POA: Diagnosis not present

## 2016-10-18 DIAGNOSIS — N183 Chronic kidney disease, stage 3 unspecified: Secondary | ICD-10-CM

## 2016-10-18 DIAGNOSIS — I5042 Chronic combined systolic (congestive) and diastolic (congestive) heart failure: Secondary | ICD-10-CM | POA: Diagnosis not present

## 2016-10-18 LAB — BASIC METABOLIC PANEL
BUN / CREAT RATIO: 20 (ref 10–24)
BUN: 29 mg/dL — AB (ref 8–27)
CHLORIDE: 102 mmol/L (ref 96–106)
CO2: 24 mmol/L (ref 20–29)
Calcium: 9.5 mg/dL (ref 8.6–10.2)
Creatinine, Ser: 1.42 mg/dL — ABNORMAL HIGH (ref 0.76–1.27)
GFR calc non Af Amer: 52 mL/min/{1.73_m2} — ABNORMAL LOW (ref >59)
GFR, EST AFRICAN AMERICAN: 60 mL/min/{1.73_m2} (ref >59)
GLUCOSE: 144 mg/dL — AB (ref 65–99)
POTASSIUM: 4 mmol/L (ref 3.5–5.2)
Sodium: 140 mmol/L (ref 134–144)

## 2016-10-18 NOTE — Patient Instructions (Addendum)
Medication Instructions:  No changes.   Labwork: Today - BMET   Testing/Procedures: None   Follow-Up: Dr. Liam Rogers on 01/12/17 @ 9 AM   Any Other Special Instructions Will Be Listed Below (If Applicable). For primary care, call: [facility] at [place] [phone] Primary Care at [facility] - [phone]  If you need a refill on your cardiac medications before your next appointment, please call your pharmacy.

## 2016-10-18 NOTE — Progress Notes (Signed)
Cardiology Office Note:    Date:  10/18/2016   ID:  [name] [name], DOB 1950/07/11, MRN [id number]  PCP:  [name] Homes, MD  Cardiologist:  Dr. Liam Rogers    Referring MD: [name] Homes, MD   Chief Complaint  Patient presents with  . Follow-up    blood pressure    History of Present Illness:    [name] [name] is a 66 y.o. male with a hx of HTN, asthma, prior stroke, nonischemic cardiomyopathy, combined systolic and diastolic HF last seen by Dr. Acie Fredrickson in 5/18. His EF has been as low as 25-30% by echocardiogram in 2015. Cardiac catheterization in 2014 demonstrated mild nonobstructive CAD with mid RCA 40% stenosis. Most recent echocardiogram in 2/18 demonstrated normal LV function with EF 50-55% and severe LVH.  He was last seen in clinic 09/28/16 with labile blood pressures.  I adjusted his blood pressure medications to hopefully keep his blood pressure stable throughout the day.  He had follow up labs with elevated Creatinine.  I decreased his dose of Lasix.     [name] [name] returns for follow up.  He is here alone.  He brings in a list of his BPs.  His readings are elevated in the AM before medication. But, the BPs are optimal or close to optimal throughout the day. He denies chest pain, shortness of breath, syncope, paroxysmal nocturnal dyspnea, edema.  He has noted a cough at times.    Prior CV studies:   The following studies were reviewed today:  Echocardiogram 03/22/16 Severe concentric LVH, EF 50-55, diffuse HK, normal RVSF, trivial AI, trivial TR, PASP 26  Carotid US 10/16/15 Bilateral ICA 1-39; R subclavian velocity increased  Echocardiogram 12/15 Severe LVH, EF 30-35, diffuse HK, grade 1 diastolic dysfunction, trivial AI, mildly dilated aortic root, mildly dilated ascending aorta, mild BAE  R/L Cardiac Catheterization 01/21/13  RCA luminal irregularities, mid 40 Mean PA 42, PCWP 26, LVEDP 29  Echocardiogram 6/15 EF 25-30, PASP 44, Gr 2 DD  Past Medical History:   Diagnosis Date  . Arthritis    "left hip; right knee" (10/09/2013)  . Asthma    'as a child"   . Blood clotting disorder (HCC)    pt. states his family has a history of "slow clotting", had bleeding after oral surgery age 26 but none with extractions 2015 or adult surgeries; thought father (now deceased) had a hx of hemophilia  . Brain aneurysm 01/11/1989  . Brain aneurysm   . CHF (congestive heart failure) ([place])   . Daily headache    are due to the medication he is taking  . Full dentures   . History of gout   . Hypertension   . Shortness of breath    "comes on at anytime" (01/18/2013)  . Sickle cell trait ([place])   . Stroke ([place]) 12/1988  . Systolic heart failure (HCC)    EF is 35 to 40%  . Wears glasses     Past Surgical History:  Procedure Laterality Date  . CARDIAC CATHETERIZATION  12/1988; 12/2012  . COLONOSCOPY WITH PROPOFOL N/A 09/04/2014   Procedure: COLONOSCOPY WITH PROPOFOL;  Surgeon: [name], MD;  Location: [facility];  Service: Endoscopy;  Laterality: N/A;  . [place]  . HERNIA REPAIR  2004?   "above the navel"   . LEFT AND RIGHT HEART CATHETERIZATION WITH CORONARY ANGIOGRAM N/A 01/21/2013   Procedure: LEFT AND RIGHT HEART CATHETERIZATION WITH CORONARY ANGIOGRAM;  Surgeon: Minus Breeding, MD;  Location: [facility]  CATH LAB;  Service: Cardiovascular;  Laterality: N/A;  . MULTIPLE TOOTH EXTRACTIONS  spring 2015   "took out 7"  . MUSCLE BIOPSY N/A 12/06/2013   Procedure: FAT PAD BIOPSY;  Surgeon: Erroll Luna, MD;  Location: [place];  Service: General;  Laterality: N/A;  . NM PET  LYMPHOMA INITIAL  2004?   "benign"  . TOTAL HIP ARTHROPLASTY Left 10/08/2013   anterior approach  . TOTAL HIP ARTHROPLASTY Left 10/08/2013   Procedure: TOTAL HIP ARTHROPLASTY ANTERIOR APPROACH;  Surgeon: Renette Butters, MD;  Location: [facility];  Service: Orthopedics;  Laterality: Left;    Current Medications: Current Meds  Medication Sig  . allopurinol  (ZYLOPRIM) 100 MG tablet Take 2 tablets (200 mg total) by mouth daily.  [name] atorvastatin (LIPITOR) 20 MG tablet Take 1 tablet (20 mg total) by mouth daily at 6 PM.  . carvedilol (COREG) 25 MG tablet TAKE 1 TABLET BY MOUTH TWICE DAILY WITH A MEAL  . furosemide (LASIX) 40 MG tablet Take 40 mg by mouth daily.  . hydrALAZINE (APRESOLINE) 50 MG tablet Take 1.5 tablets (75 mg total) by mouth 3 (three) times daily.  [name] lisinopril (PRINIVIL,ZESTRIL) 10 MG tablet Take 1 tablet (10 mg total) by mouth 2 (two) times daily.  [name] spironolactone (ALDACTONE) 25 MG tablet Take 1 tablet (25 mg total) by mouth daily.     Allergies:   Hydrocodone and Hydrocodone-acetaminophen   Social History   Social History  . Marital status: Married    Spouse name: Gregary Signs  . Number of children: 4  . Years of education: Assoc.   Occupational History  .      not employed   Social History Main Topics  . Smoking status: Never Smoker  . Smokeless tobacco: Never Used  . Alcohol use No  . Drug use: Yes    Types: Marijuana     Comment: "stopped smoking marijuana in the late 1990's"  . Sexual activity: Yes   Other Topics Concern  . None   Social History Narrative   Patient consumes caffeine occas.     Family Hx: The patient's family history includes Bleeding Disorder in his father; Cancer in his brother and father; Heart attack in his mother; Hyperlipidemia in his brother; Hypertension in his brother, father, mother, and sister; Varicose Veins in his mother.  ROS:   Please see the history of present illness.    Review of Systems  Respiratory: Positive for cough.    All other systems reviewed and are negative.   EKGs/Labs/Other Test Reviewed:    EKG:  EKG is not ordered today.  The ekg ordered today demonstrates n/a  Recent Labs: 01/28/2016: ALT 28 09/28/2016: Hemoglobin 13.8; Platelets 268; TSH 1.080 10/06/2016: BUN 42; Creatinine, Ser 1.73; Potassium 4.6; Sodium 139   Recent Lipid Panel Lab Results  Component  Value Date/Time   CHOL 160 01/28/2016 10:05 AM   TRIG 115 01/28/2016 10:05 AM   HDL 38 (L) 01/28/2016 10:05 AM   CHOLHDL 4.2 01/28/2016 10:05 AM   CHOLHDL 3.3 07/30/2015 08:42 AM   LDLCALC 99 01/28/2016 10:05 AM    Physical Exam:    VS:  BP 128/74   Pulse 70   Ht 6\' 4"  (1.93 m)   Wt 270 lb (122.5 kg)   BMI 32.87 kg/m     Wt Readings from Last 3 Encounters:  10/18/16 270 lb (122.5 kg)  09/28/16 264 lb 12.8 oz (120.1 kg)  09/14/16 268 lb 4.8 oz (121.7 kg)  Physical Exam  Constitutional: He is oriented to person, place, and time. He appears well-developed and well-nourished. No distress.  HENT:  Head: Normocephalic and atraumatic.  Neck: No JVD present.  Cardiovascular: Normal rate and regular rhythm.   No murmur heard. Pulmonary/Chest: Effort normal. He has no rales.  Abdominal: Soft.  Musculoskeletal: He exhibits no edema.  Neurological: He is alert and oriented to person, place, and time.  Skin: Skin is warm and dry.  Psychiatric: He has a normal mood and affect.    ASSESSMENT:    1. Hypertensive heart disease with chronic combined systolic and diastolic congestive heart failure ([place])   2. CKD (chronic kidney disease) stage 3, GFR 30-59 ml/min    PLAN:    In order of problems listed above:  1. Hypertensive heart disease with chronic combined systolic and diastolic congestive heart failure (HCC) BP is much better controlled and consistent throughout the day.  His volume is stable.  Continue current regimen which includes beta-blocker, ACE inhibitor, hydralazine, aldosterone antagonist.    2. CKD (chronic kidney disease) stage 3, GFR 30-59 ml/min Creatinine has been a little elevated recently. His Lasix was adjusted.  Repeat BMET today.   Dispo:  Return in about 3 months (around 01/17/2017) for Routine Follow Up, w/ Dr. Acie Fredrickson.   Medication Adjustments/Labs and Tests Ordered: Current medicines are reviewed at length with the patient today.  Concerns  regarding medicines are outlined above.  Tests Ordered: Orders Placed This Encounter  Procedures  . Basic Metabolic Panel (BMET)   Medication Changes: No orders of the defined types were placed in this encounter.   Signed, [name], PA-C  10/18/2016 12:09 PM    [facility] [address], [place], [place]  [address] Phone: [phone]; Fax: [phone]

## 2016-10-19 ENCOUNTER — Telehealth: Payer: Self-pay

## 2016-10-19 NOTE — Telephone Encounter (Signed)
Patient notified of lab results by phone with verbal understanding to continue current medications. He had no questions.

## 2016-10-19 NOTE — Telephone Encounter (Signed)
-----   Message from Liliane Shi, [place] sent at 10/18/2016  5:18 PM EDT ----- Please call the patient Kidney function is improved (Creatinine lower). Continue with current treatment plan. [name], PA-C   10/18/2016 5:18 PM

## 2016-10-24 ENCOUNTER — Other Ambulatory Visit: Payer: Self-pay | Admitting: Internal Medicine

## 2016-10-24 NOTE — Telephone Encounter (Signed)
Patient requesting a callback for refills

## 2016-10-24 NOTE — Telephone Encounter (Signed)
Returned call to patient- states he needs a rx for allopurinol.  Per pt's chart-he should have a refill on file. Pt also wanted to inquire about a sleep study that had been previously ordered. Pt informed that appt had not been scheduled, and he proceeded to inform me that he will be transferring his care to [facility] and has an upcoming appt on 10/11.  Pt informed that his new pcp at [facility] can also place order a sleep study if appropriate.  Contacted [facility] and was informed that pt has a refill on file, but insurance will not pay for it until 10/04.

## 2016-11-03 ENCOUNTER — Ambulatory Visit (INDEPENDENT_AMBULATORY_CARE_PROVIDER_SITE_OTHER): Payer: Medicare HMO | Admitting: Family Medicine

## 2016-11-03 ENCOUNTER — Encounter: Payer: Self-pay | Admitting: Family Medicine

## 2016-11-03 VITALS — BP 120/82 | HR 64 | Ht 76.0 in | Wt 265.0 lb

## 2016-11-03 DIAGNOSIS — E7800 Pure hypercholesterolemia, unspecified: Secondary | ICD-10-CM | POA: Diagnosis not present

## 2016-11-03 DIAGNOSIS — I130 Hypertensive heart and chronic kidney disease with heart failure and stage 1 through stage 4 chronic kidney disease, or unspecified chronic kidney disease: Secondary | ICD-10-CM

## 2016-11-03 DIAGNOSIS — G4733 Obstructive sleep apnea (adult) (pediatric): Secondary | ICD-10-CM

## 2016-11-03 DIAGNOSIS — I5020 Unspecified systolic (congestive) heart failure: Secondary | ICD-10-CM | POA: Diagnosis not present

## 2016-11-03 DIAGNOSIS — N182 Chronic kidney disease, stage 2 (mild): Secondary | ICD-10-CM | POA: Diagnosis not present

## 2016-11-03 DIAGNOSIS — M1A9XX0 Chronic gout, unspecified, without tophus (tophi): Secondary | ICD-10-CM

## 2016-11-03 DIAGNOSIS — Z23 Encounter for immunization: Secondary | ICD-10-CM | POA: Diagnosis not present

## 2016-11-03 DIAGNOSIS — I5042 Chronic combined systolic (congestive) and diastolic (congestive) heart failure: Secondary | ICD-10-CM | POA: Diagnosis not present

## 2016-11-03 MED ORDER — ALLOPURINOL 100 MG PO TABS: 200.0000 mg | ORAL_TABLET | Freq: Every day | ORAL | 1 refills | Status: DC

## 2016-11-03 NOTE — Assessment & Plan Note (Signed)
Split-night sleep study ordered today.

## 2016-11-03 NOTE — Progress Notes (Signed)
Subjective:  [name] [name] is a 66 y.o. male who presents today with a chief complaint of Gout and to establish care.   HPI:  Gout, chronic problem, new to this provider Several year history. Usually gets gout flares in his ankles. Has been quite a while since his last gout flare. He takes allopurinol 100 mg twice a day. He has been stable on this dose. No apparent side effects.  OSA, chronic problem, new to this provider Sleep study done several years ago. He has been using a CPAP machine however has not used it in several months. Reports that is now uncomfortable for him to use it. He was referred for a sleep study several months ago, however never received any information about having this scheduled.  Coronary artery disease/hyperlipidemia chronic problem, new to this provider Currently on Lipitor 20 mg daily. Tolerating this without side effects.  ROS: Per history of present illness, otherwise a 14 point review of systems was performed and was negative  PMH:  The following were reviewed and entered/updated in epic: Past Medical History:  Diagnosis Date  . Arthritis    "left hip; right knee" (10/09/2013)  . Asthma    'as a child"   . Blood clotting disorder (HCC)    pt. states his family has a history of "slow clotting", had bleeding after oral surgery age 21 but none with extractions 2015 or adult surgeries; thought father (now deceased) had a hx of hemophilia  . Brain aneurysm 01/11/1989  . Brain aneurysm   . CHF (congestive heart failure) ([place])   . Daily headache    are due to the medication he is taking  . Full dentures   . History of gout   . Hypertension   . Shortness of breath    "comes on at anytime" (01/18/2013)  . Sickle cell trait ([place])   . Stroke ([place]) 12/1988  . Systolic heart failure (HCC)    EF is 35 to 40%  . Wears glasses    Patient Active Problem List   Diagnosis Date Noted  . CAD (coronary artery disease) - Nonobstructive by Cardiac Cath in  2014 (mRCA 40%) 09/28/2016  . Acute nonintractable headache 09/14/2016  . Trigger finger 07/15/2015  . OSA (obstructive sleep apnea) 03/21/2014  . Differential blood pressure between extremities 12/16/2013  . Sensorineural hearing loss of both ears 11/14/2013  . NS (nuclear sclerosis) 11/01/2013  . Conjunctival pterygium 11/01/2013  . History of CVA (cerebrovascular accident) 09/11/2013  . Anger 09/11/2013  . Osteoarthritis 05/25/2013  . Gout 05/25/2013  . Hyperglycemia 05/25/2013  . Hyperlipidemia 05/25/2013  . Chronic combined systolic and diastolic CHF (congestive heart failure) ([place]) 03/25/2013  . Eczema 03/25/2013  . Benign hypertensive heart and kidney disease with systolic CHF, NYHA class 2 and CKD stage 2 ([place]) 01/18/2013   Past Surgical History:  Procedure Laterality Date  . CARDIAC CATHETERIZATION  12/1988; 12/2012  . COLONOSCOPY WITH PROPOFOL N/A 09/04/2014   Procedure: COLONOSCOPY WITH PROPOFOL;  Surgeon: [name], MD;  Location: [facility];  Service: Endoscopy;  Laterality: N/A;  . [place]  . HERNIA REPAIR  2004?   "above the navel"   . LEFT AND RIGHT HEART CATHETERIZATION WITH CORONARY ANGIOGRAM N/A 01/21/2013   Procedure: LEFT AND RIGHT HEART CATHETERIZATION WITH CORONARY ANGIOGRAM;  Surgeon: Minus Breeding, MD;  [facility];  Service: Cardiovascular;  Laterality: N/A;  . MULTIPLE TOOTH EXTRACTIONS  spring 2015   "took out 7"  .  MUSCLE BIOPSY N/A 12/06/2013   Procedure: FAT PAD BIOPSY;  Surgeon: Erroll Luna, MD;  Location: [facility];  Service: General;  Laterality: N/A;  . NM PET  LYMPHOMA INITIAL  2004?   "benign"  . TOTAL HIP ARTHROPLASTY Left 10/08/2013   anterior approach  . TOTAL HIP ARTHROPLASTY Left 10/08/2013   Procedure: TOTAL HIP ARTHROPLASTY ANTERIOR APPROACH;  Surgeon: Renette Butters, MD;  Location: [facility];  Service: Orthopedics;  Laterality: Left;    Family History  Problem Relation Age of Onset  .  Heart attack Mother   . Hypertension Mother   . Varicose Veins Mother   . Cancer Father   . Hypertension Father   . Bleeding Disorder Father   . Hypertension Sister   . Cancer Brother   . Hypertension Brother   . Hyperlipidemia Brother     Medications- reviewed and updated Current Outpatient Prescriptions  Medication Sig Dispense Refill  . allopurinol (ZYLOPRIM) 100 MG tablet Take 2 tablets (200 mg total) by mouth daily. 180 tablet 1  . atorvastatin (LIPITOR) 20 MG tablet Take 1 tablet (20 mg total) by mouth daily at 6 PM. 90 tablet 3  . carvedilol (COREG) 25 MG tablet TAKE 1 TABLET BY MOUTH TWICE DAILY WITH A MEAL 180 tablet 3  . furosemide (LASIX) 40 MG tablet Take 40 mg by mouth daily.    . hydrALAZINE (APRESOLINE) 50 MG tablet Take 1.5 tablets (75 mg total) by mouth 3 (three) times daily. 270 tablet 3  . lisinopril (PRINIVIL,ZESTRIL) 10 MG tablet Take 1 tablet (10 mg total) by mouth 2 (two) times daily. 180 tablet 3  . spironolactone (ALDACTONE) 25 MG tablet Take 1 tablet (25 mg total) by mouth daily. 90 tablet 3   No current facility-administered medications for this visit.     Allergies-reviewed and updated Allergies  Allergen Reactions  . Hydrocodone Itching    Itch and crazy  . Hydrocodone-Acetaminophen Hives    Social History   Social History  . Marital status: Married    Spouse name: Gregary Signs  . Number of children: 4  . Years of education: Assoc.   Occupational History  .      not employed   Social History Main Topics  . Smoking status: Never Smoker  . Smokeless tobacco: Never Used  . Alcohol use No  . Drug use: Yes    Types: Marijuana     Comment: "stopped smoking marijuana in the late 1990's"  . Sexual activity: Yes   Other Topics Concern  . None   Social History Narrative   Patient consumes caffeine occas.   Objective:  Physical Exam: BP 120/82   Pulse 64   Ht 6\' 4"  (1.93 m)   Wt 265 lb (120.2 kg)   SpO2 97%   BMI 32.26 kg/m   Gen: NAD,  resting comfortably HEENT: TMs clear. Oropharynx clear. No thyromegaly. CV: RRR with no murmurs appreciated Pulm: NWOB, CTAB with no crackles, wheezes, or rhonchi GI: Normal bowel sounds present. Soft, Nontender, Nondistended. MSK: No edema, cyanosis, or clubbing noted Skin: Warm, dry Neuro: Grossly normal, moves all extremities Psych: Normal affect and thought content  Assessment/Plan:  Gout Continue allopurinol 200 mg daily. Check uric acid level with next lab draw.  OSA (obstructive sleep apnea) Split-night sleep study ordered today.  Hyperlipidemia Continue Lipitor 20 mg daily. Last LDL 99 earlier this year. Recheck lipid panel with next lab draw.   Chronic combined systolic and diastolic CHF (congestive heart failure) (  [place]) Stable today. Continue current regimen. Patient asked about reducing dose of his medications. Defer to cardiology for further management.  Benign hypertensive heart and kidney disease with systolic CHF, NYHA class 2 and CKD stage 2 ([place]) Blood pressure goal today. Continue current regimen.  Preventative healthcare Flu shot given today.  Follow-up in 6 months for next visit.  Algis Greenhouse. Jerline Pain, MD 11/03/2016 11:15 AM

## 2016-11-03 NOTE — Assessment & Plan Note (Signed)
Stable today. Continue current regimen. Patient asked about reducing dose of his medications. Defer to cardiology for further management.

## 2016-11-03 NOTE — Assessment & Plan Note (Addendum)
Continue allopurinol 200 mg daily. Check uric acid level with next lab draw.

## 2016-11-03 NOTE — Assessment & Plan Note (Signed)
Continue Lipitor 20 mg daily. Last LDL 99 earlier this year. Recheck lipid panel with next lab draw.

## 2016-11-03 NOTE — Patient Instructions (Signed)
Talk to your doctor about your medications.  I sent in allopurinol to your pharmacy.  I also put an order for a sleep study.  Come back to see me in 6 months, or sooner as needed.  Take care, Dr. Jerline Pain

## 2016-11-03 NOTE — Assessment & Plan Note (Signed)
Blood pressure goal today. Continue current regimen.

## 2016-11-08 ENCOUNTER — Telehealth: Payer: Self-pay | Admitting: Family Medicine

## 2016-11-08 NOTE — Telephone Encounter (Signed)
patieht has [name] and it needs prior authorization done by referring provider. Please call sleep center back when this has been completed at [phone]

## 2016-11-10 NOTE — Telephone Encounter (Signed)
Spoke with [employer] at length this morning to get split-night sleep study approved. Was informed it did not meet initial requirement, and that further clinical review paperwork would be faxed over. Once paperwork is received it will be placed in providers box to be completed and sent back to [facility] for further review.

## 2016-11-10 NOTE — Telephone Encounter (Signed)
Have either of you seen any PA paperwork?  Thanks,  Algis Greenhouse. Jerline Pain, MD 11/10/2016 8:45 AM

## 2016-11-14 ENCOUNTER — Telehealth (HOSPITAL_BASED_OUTPATIENT_CLINIC_OR_DEPARTMENT_OTHER): Payer: Self-pay | Admitting: *Deleted

## 2016-11-14 NOTE — Telephone Encounter (Signed)
Lanny Hurst from [facility] called to advise of approval auth # from insurance and that the approval was for CPAP titration. NPSG was done in 2016, followed by a CPAP titration. Patient has been having issues with compliance recently and Dr. Jerline Pain is requesting a re-titration. Auth # [id number] 11/10/2016 to 12/11/2016

## 2016-11-15 ENCOUNTER — Ambulatory Visit (HOSPITAL_BASED_OUTPATIENT_CLINIC_OR_DEPARTMENT_OTHER): Payer: Medicare HMO | Attending: Family Medicine | Admitting: Internal Medicine

## 2016-11-15 VITALS — Ht 76.0 in | Wt 265.0 lb

## 2016-11-15 DIAGNOSIS — G4733 Obstructive sleep apnea (adult) (pediatric): Secondary | ICD-10-CM | POA: Diagnosis not present

## 2016-11-15 NOTE — Telephone Encounter (Signed)
Paperwork is no longer needed for insurance.

## 2016-11-20 DIAGNOSIS — G4733 Obstructive sleep apnea (adult) (pediatric): Secondary | ICD-10-CM | POA: Diagnosis not present

## 2016-11-20 NOTE — Procedures (Signed)
  Patient Name: [name] [name], [name] [name] Date: 11/15/2016 Gender: Male D.O.B: 10-27-1950 Age (years): 66 Referring Provider: Vivi Barrack Height (inches): 76 Interpreting Physician: Baird Lyons MD, ABSM Weight (lbs): 265 RPSGT: Laren Everts BMI: 32 MRN: [id number] Neck Size: 18.00 CLINICAL INFORMATION The patient is referred for a CPAP titration to treat sleep apnea.  Date of NPSG, Split Night or HST:  02/05/14   AHI 25.4/ hr, desaturation to 83%, bodyweight 270 lbs  SLEEP STUDY TECHNIQUE As per the AASM Manual for the Scoring of Sleep and Associated Events v2.3 (April 2016) with a hypopnea requiring 4% desaturations.  The channels recorded and monitored were frontal, central and occipital EEG, electrooculogram (EOG), submentalis EMG (chin), nasal and oral airflow, thoracic and abdominal wall motion, anterior tibialis EMG, snore microphone, electrocardiogram, and pulse oximetry. Continuous positive airway pressure (CPAP) was initiated at the beginning of the study and titrated to treat sleep-disordered breathing.  MEDICATIONS Medications self-administered by patient taken the night of the study : HYDRALAZINE  TECHNICIAN COMMENTS Comments added by technician: Patient was restless all through the night. Patient had more than two awakenings to use the bathroom  Comments added by scorer: N/A  RESPIRATORY PARAMETERS Optimal PAP Pressure (cm): 13 AHI at Optimal Pressure (/hr): 0.0 Overall Minimal O2 (%): 90.00 Supine % at Optimal Pressure (%): 0 Minimal O2 at Optimal Pressure (%): 94.0    SLEEP ARCHITECTURE The study was initiated at 10:40:34 PM and ended at 4:57:42 AM.  Sleep onset time was 10.9 minutes and the sleep efficiency was 57.4%. The total sleep time was 216.5 minutes.  The patient spent 10.62% of the night in stage N1 sleep, 80.37% in stage N2 sleep, 0.00% in stage N3 and 9.01% in REM.Stage REM latency was 61.0 minutes  Wake after sleep onset was 149.7. Alpha  intrusion was absent. Supine sleep was 0.00%.  CARDIAC DATA The 2 lead EKG demonstrated sinus rhythm. The mean heart rate was 58.40 beats per minute. Other EKG findings include: PVCs.  LEG MOVEMENT DATA The total Periodic Limb Movements of Sleep (PLMS) were 0. The PLMS index was 0.00. A PLMS index of <15 is considered normal in adults.  IMPRESSIONS - The optimal PAP pressure was 13 cm of water. - Central sleep apnea was not noted during this titration (CAI = 1.9/h). - Moderate oxygen desaturations were observed during this titration (min O2 = 90.00%). [place] O2 saturation at CPAP 13 was 94%. - The patient snored with moderate snoring volume during this titration study. - 2-lead EKG demonstrated: PVCs - Clinically significant periodic limb movements were not noted during this study. Arousals associated with PLMs were rare.  DIAGNOSIS - Obstructive Sleep Apnea (327.23 [G47.33 ICD-10])  RECOMMENDATIONS - Trial of CPAP therapy on 13 cm H2O with a Medium Wide size Philips Respironics Full Face Mask Dreamwear mask and heated humidification. - Be careful with alcohol, sedatives and other CNS depressants that may worsen sleep apnea and disrupt normal sleep architecture. - Sleep hygiene should be reviewed to assess factors that may improve sleep quality. - Weight management and regular exercise should be initiated or continued.  [Electronically signed] 11/20/2016 05:40 PM  Baird Lyons MD, [place], American Board of Sleep Medicine   NPI: [id number]  [place], [place] of Sleep Medicine  ELECTRONICALLY SIGNED ON:  11/20/2016, 5:37 PM [place] PH: [phone]   FX: [phone] [place]

## 2016-11-23 ENCOUNTER — Other Ambulatory Visit: Payer: Self-pay | Admitting: Family Medicine

## 2016-11-23 DIAGNOSIS — G4733 Obstructive sleep apnea (adult) (pediatric): Secondary | ICD-10-CM

## 2016-11-23 NOTE — Progress Notes (Unsigned)
DME supplies for CPAP ordered.  Algis Greenhouse. Jerline Pain, MD 11/23/2016 8:12 AM

## 2016-12-01 DIAGNOSIS — G4733 Obstructive sleep apnea (adult) (pediatric): Secondary | ICD-10-CM | POA: Diagnosis not present

## 2016-12-13 ENCOUNTER — Other Ambulatory Visit: Payer: Self-pay | Admitting: *Deleted

## 2016-12-13 ENCOUNTER — Other Ambulatory Visit: Payer: Self-pay | Admitting: Cardiovascular Disease

## 2016-12-13 MED ORDER — ATORVASTATIN CALCIUM 20 MG PO TABS: 20.0000 mg | ORAL_TABLET | Freq: Every day | ORAL | 3 refills | Status: DC

## 2016-12-13 MED ORDER — HYDRALAZINE HCL 50 MG PO TABS: 75.0000 mg | ORAL_TABLET | Freq: Three times a day (TID) | ORAL | 3 refills | Status: DC

## 2016-12-13 MED ORDER — ALLOPURINOL 100 MG PO TABS: 200.0000 mg | ORAL_TABLET | Freq: Every day | ORAL | 3 refills | Status: DC

## 2016-12-19 ENCOUNTER — Other Ambulatory Visit: Payer: Self-pay

## 2016-12-19 MED ORDER — SPIRONOLACTONE 25 MG PO TABS: 25.0000 mg | ORAL_TABLET | Freq: Every day | ORAL | 2 refills | Status: DC

## 2016-12-19 MED ORDER — LISINOPRIL 10 MG PO TABS: 10.0000 mg | ORAL_TABLET | Freq: Two times a day (BID) | ORAL | 2 refills | Status: DC

## 2016-12-23 ENCOUNTER — Other Ambulatory Visit: Payer: Self-pay | Admitting: Cardiovascular Disease

## 2016-12-23 ENCOUNTER — Other Ambulatory Visit: Payer: Self-pay

## 2016-12-23 MED ORDER — SPIRONOLACTONE 25 MG PO TABS: 25.0000 mg | ORAL_TABLET | Freq: Every day | ORAL | 2 refills | Status: DC

## 2016-12-23 MED ORDER — LISINOPRIL 10 MG PO TABS: 10.0000 mg | ORAL_TABLET | Freq: Two times a day (BID) | ORAL | 2 refills | Status: DC

## 2016-12-23 MED ORDER — ALLOPURINOL 100 MG PO TABS: 200.0000 mg | ORAL_TABLET | Freq: Every day | ORAL | 3 refills | Status: DC

## 2016-12-23 MED ORDER — ATORVASTATIN CALCIUM 20 MG PO TABS: 20.0000 mg | ORAL_TABLET | Freq: Every day | ORAL | 2 refills | Status: DC

## 2016-12-23 MED ORDER — FUROSEMIDE 40 MG PO TABS: 40.0000 mg | ORAL_TABLET | Freq: Every day | ORAL | 2 refills | Status: DC

## 2016-12-23 MED ORDER — HYDRALAZINE HCL 50 MG PO TABS: 75.0000 mg | ORAL_TABLET | Freq: Three times a day (TID) | ORAL | 2 refills | Status: DC

## 2016-12-23 MED ORDER — CARVEDILOL 25 MG PO TABS: ORAL_TABLET | ORAL | 2 refills | Status: DC

## 2017-01-06 ENCOUNTER — Encounter: Payer: Self-pay | Admitting: *Deleted

## 2017-01-12 ENCOUNTER — Encounter: Payer: Self-pay | Admitting: Cardiovascular Disease

## 2017-01-12 ENCOUNTER — Ambulatory Visit: Payer: Medicare HMO | Admitting: Cardiovascular Disease

## 2017-01-12 VITALS — BP 102/64 | HR 70 | Ht 76.0 in | Wt 272.8 lb

## 2017-01-12 DIAGNOSIS — I5022 Chronic systolic (congestive) heart failure: Secondary | ICD-10-CM | POA: Diagnosis not present

## 2017-01-12 DIAGNOSIS — I5042 Chronic combined systolic (congestive) and diastolic (congestive) heart failure: Secondary | ICD-10-CM | POA: Diagnosis not present

## 2017-01-12 DIAGNOSIS — I110 Hypertensive heart disease with heart failure: Secondary | ICD-10-CM | POA: Diagnosis not present

## 2017-01-12 MED ORDER — FUROSEMIDE 40 MG PO TABS: 40.0000 mg | ORAL_TABLET | ORAL | 3 refills | Status: DC

## 2017-01-12 NOTE — Patient Instructions (Addendum)
Medication Instructions:  Your physician has recommended you make the following change in your medication:  1- DECREASE Lasix 40 mg by mouth three times a week  Labwork: Your physician recommends that you return for lab work in: 6 months for BMET, lipid and liver panel.   Testing/Procedures: NONE  Follow-Up: Your physician wants you to follow-up in: 6 months with Dr. Acie Fredrickson. You will receive a reminder letter in the mail two months in advance. If you don't receive a letter, please call our office to schedule the follow-up appointment.   If you need a refill on your cardiac medications before your next appointment, please call your pharmacy.
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
## 2017-01-26 ENCOUNTER — Ambulatory Visit: Payer: Medicare HMO | Admitting: Family Medicine

## 2017-01-26 ENCOUNTER — Encounter: Payer: Self-pay | Admitting: Family Medicine

## 2017-01-26 VITALS — BP 126/82 | HR 59 | Temp 98.3°F | Ht 76.0 in | Wt 277.2 lb

## 2017-01-26 DIAGNOSIS — I130 Hypertensive heart and chronic kidney disease with heart failure and stage 1 through stage 4 chronic kidney disease, or unspecified chronic kidney disease: Secondary | ICD-10-CM

## 2017-01-26 DIAGNOSIS — I5020 Unspecified systolic (congestive) heart failure: Secondary | ICD-10-CM | POA: Diagnosis not present

## 2017-01-26 DIAGNOSIS — N182 Chronic kidney disease, stage 2 (mild): Secondary | ICD-10-CM

## 2017-01-26 DIAGNOSIS — G4733 Obstructive sleep apnea (adult) (pediatric): Secondary | ICD-10-CM | POA: Diagnosis not present

## 2017-01-26 DIAGNOSIS — M1A9XX0 Chronic gout, unspecified, without tophus (tophi): Secondary | ICD-10-CM | POA: Diagnosis not present

## 2017-01-26 MED ORDER — LISINOPRIL 10 MG PO TABS: 10.0000 mg | ORAL_TABLET | Freq: Every day | ORAL | 2 refills | Status: DC

## 2017-01-26 MED ORDER — ALLOPURINOL 100 MG PO TABS: 100.0000 mg | ORAL_TABLET | Freq: Every day | ORAL | 3 refills | Status: DC

## 2017-01-26 NOTE — Progress Notes (Signed)
   Subjective:  [name] [name] is a 67 y.o. male who presents today with a chief complaint of hypertension follow-up.   HPI:  Hypertension, established problem, Stable BP Readings from Last 3 Encounters:  01/26/17 126/82  01/12/17 102/64  11/03/16 120/82   Home BP monitoring: 120s/80s. Current Medications: Coreg 25 mg twice daily, hydralazine 75 mg 3 times daily, lisinopril 10 mg twice daily, Spironolactone 25 mg daily, compliant without side effects.  He requests that his medications be titrated down today.  OSA on CPAP, established problem, improving Patient able to have his sleep study done.  He is now on CPAP.  Tolerating well.  Has noticed significant improvement in his daytime energy levels.  Also significant improvement in his headaches.  Gout, established problem, stable Currently on allopurinol 200 mg daily.  He has been on this dose for several years.  No recent gout flares.  Has been trying to make changes to his diet to avoid flareups.  Encounter for form completion Patient request that handicap placard be filled out.  States that he is not able to walk for longer than 200 feet without stopping to rest.  ROS: Per HPI  PMH: he reports that  has never smoked. he has never used smokeless tobacco. He reports that he uses drugs. Drug: Marijuana. He reports that he does not drink alcohol.  Objective:  Physical Exam: BP 126/82 (BP Location: Left Arm, Patient Position: Sitting, Cuff Size: Normal)   Pulse (!) 59   Temp 98.3 F (36.8 C) (Oral)   Ht 6\' 4"  (1.93 m)   Wt 277 lb 3.2 oz (125.7 kg)   SpO2 97%   BMI 33.74 kg/m   Gen: NAD, resting comfortably CV: RRR with no murmurs appreciated Pulm: NWOB, CTAB with no crackles, wheezes, or rhonchi MSK: Trace pretibial edema bilaterally.  Assessment/Plan:  Benign hypertensive heart and kidney disease with systolic CHF, NYHA class 2 and CKD stage 2 (HCC) Will decrease lisinopril to 10 mg daily.  Continue Coreg 25 mg daily,  hydralazine 75 mg 3 times daily, and spironolactone 25 mg daily.  He will follow-up with me in 6 months.  Advised continue home blood pressure monitoring with goal blood pressure 140/90 or less.  Discussed lifestyle modifications including low-salt diet and regular exercise.  Gout No recent flareups.  Patient wishes to decrease his medications.  We will decrease allopurinol to 100 mg daily.  OSA (obstructive sleep apnea) Doing well on CPAP.  We will continue this.  Hopefully this will also help with his blood pressure.  Encounter for form completion Handicap like a form filled out for patient today.  Algis Greenhouse. Jerline Pain, MD 01/26/2017 2:01 PM

## 2017-01-26 NOTE — Assessment & Plan Note (Addendum)
Will decrease lisinopril to 10 mg daily.  Continue Coreg 25 mg daily, hydralazine 75 mg 3 times daily, and spironolactone 25 mg daily.  He will follow-up with me in 6 months.  Advised continue home blood pressure monitoring with goal blood pressure 140/90 or less.  Discussed lifestyle modifications including low-salt diet and regular exercise.

## 2017-01-26 NOTE — Assessment & Plan Note (Signed)
No recent flareups.  Patient wishes to decrease his medications.  We will decrease allopurinol to 100 mg daily.

## 2017-01-26 NOTE — Patient Instructions (Signed)
Decrease your allopurinol to 100mg  daily.  Decrease your lisinopril to once daily.  Come back to see me in 6 months, or sooner as needed.  Take care, Dr Jerline Pain

## 2017-01-26 NOTE — Assessment & Plan Note (Signed)
Doing well on CPAP.  We will continue this.  Hopefully this will also help with his blood pressure.

## 2017-02-14 ENCOUNTER — Other Ambulatory Visit: Payer: Self-pay | Admitting: Cardiovascular Disease

## 2017-03-14 DIAGNOSIS — G4733 Obstructive sleep apnea (adult) (pediatric): Secondary | ICD-10-CM | POA: Diagnosis not present

## 2017-05-17 DIAGNOSIS — M1711 Unilateral primary osteoarthritis, right knee: Secondary | ICD-10-CM | POA: Diagnosis not present

## 2017-07-13 ENCOUNTER — Other Ambulatory Visit: Payer: Medicare HMO

## 2017-07-13 ENCOUNTER — Other Ambulatory Visit: Payer: Self-pay | Admitting: Cardiovascular Disease

## 2017-07-13 DIAGNOSIS — I110 Hypertensive heart disease with heart failure: Secondary | ICD-10-CM | POA: Diagnosis not present

## 2017-07-13 DIAGNOSIS — I5022 Chronic systolic (congestive) heart failure: Secondary | ICD-10-CM

## 2017-07-13 DIAGNOSIS — I5042 Chronic combined systolic (congestive) and diastolic (congestive) heart failure: Principal | ICD-10-CM

## 2017-07-13 LAB — LIPID PANEL
CHOL/HDL RATIO: 4 ratio (ref 0.0–5.0)
Cholesterol, Total: 139 mg/dL (ref 100–199)
HDL: 35 mg/dL — AB (ref >39)
LDL CALC: 88 mg/dL (ref 0–99)
Triglycerides: 82 mg/dL (ref 0–149)
VLDL CHOLESTEROL CAL: 16 mg/dL (ref 5–40)

## 2017-07-13 LAB — BASIC METABOLIC PANEL
BUN/Creatinine Ratio: 9 — ABNORMAL LOW (ref 10–24)
BUN: 11 mg/dL (ref 8–27)
CALCIUM: 9.5 mg/dL (ref 8.6–10.2)
CHLORIDE: 104 mmol/L (ref 96–106)
CO2: 24 mmol/L (ref 20–29)
Creatinine, Ser: 1.17 mg/dL (ref 0.76–1.27)
GFR calc Af Amer: 75 mL/min/{1.73_m2} (ref >59)
GFR calc non Af Amer: 65 mL/min/{1.73_m2} (ref >59)
Glucose: 103 mg/dL — ABNORMAL HIGH (ref 65–99)
POTASSIUM: 5.1 mmol/L (ref 3.5–5.2)
Sodium: 141 mmol/L (ref 134–144)

## 2017-07-13 LAB — HEPATIC FUNCTION PANEL
ALBUMIN: 3.9 g/dL (ref 3.6–4.8)
ALT: 20 IU/L (ref 0–44)
AST: 20 IU/L (ref 0–40)
Alkaline Phosphatase: 73 IU/L (ref 39–117)
BILIRUBIN TOTAL: 0.8 mg/dL (ref 0.0–1.2)
Bilirubin, Direct: 0.25 mg/dL (ref 0.00–0.40)
Total Protein: 6.4 g/dL (ref 6.0–8.5)

## 2017-07-13 MED ORDER — CARVEDILOL 25 MG PO TABS: ORAL_TABLET | ORAL | 1 refills | Status: DC

## 2017-07-13 MED ORDER — LISINOPRIL 10 MG PO TABS: 10.0000 mg | ORAL_TABLET | Freq: Every day | ORAL | 1 refills | Status: DC

## 2017-07-13 MED ORDER — SPIRONOLACTONE 25 MG PO TABS: 25.0000 mg | ORAL_TABLET | Freq: Every day | ORAL | 1 refills | Status: DC

## 2017-07-13 NOTE — Telephone Encounter (Signed)
Pt's medication was sent to pt's pharmacy as requested. Confirmation received.  °

## 2017-07-24 ENCOUNTER — Telehealth: Payer: Self-pay | Admitting: Family Medicine

## 2017-07-24 NOTE — Telephone Encounter (Signed)
Copied from [place] [id number]. Topic: General - Other >> Jul 24, 2017  3:23 PM Burchel, Abbi R wrote: Reason for CRM:  Pt needs to schedule AWV    Pt Cb#: [phone]

## 2017-08-08 IMAGING — CT CT HEAD W/O CM
2 series · 15 of 30 positions shown, 17 images · non-contrast
Comparison: None.

CLINICAL DATA: 65-year-old male with headache

EXAM:
CT HEAD WITHOUT CONTRAST
TECHNIQUE: Contiguous axial images were obtained from the base of the skull
through the vertex without intravenous contrast.

[Series 2: head without · axial · non-contrast · 0.45mm/px · z∈[+763,+883]mm · 7 of 33 slices shown, 9 images]
[im 5/33  brain]
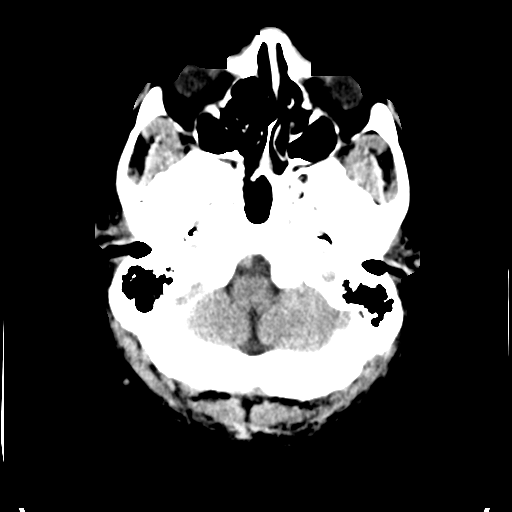
[im 5/33  bone]
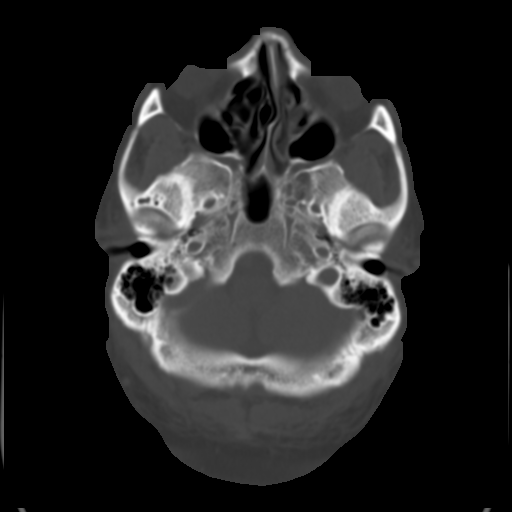
[im 9/33  brain]
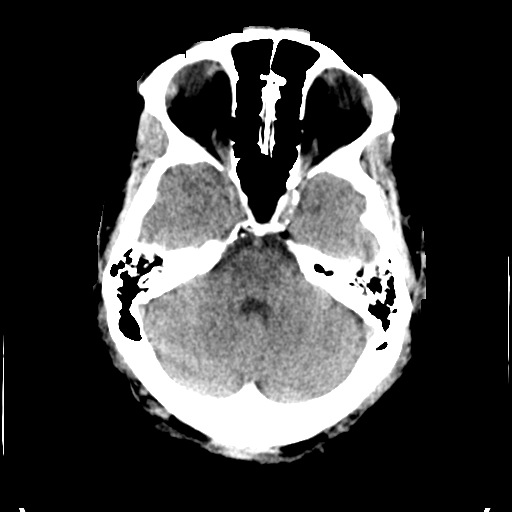
[im 13/33  brain]
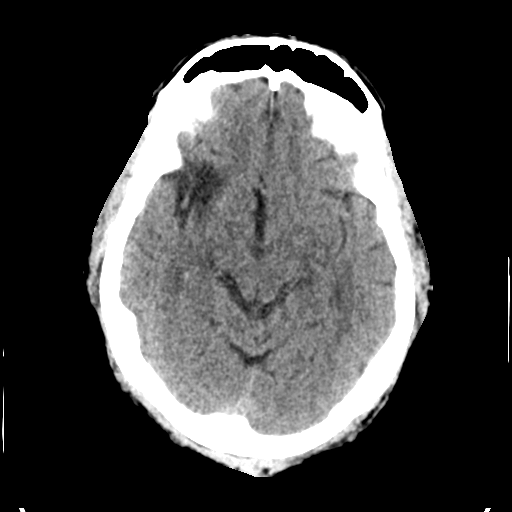
[im 17/33  brain]
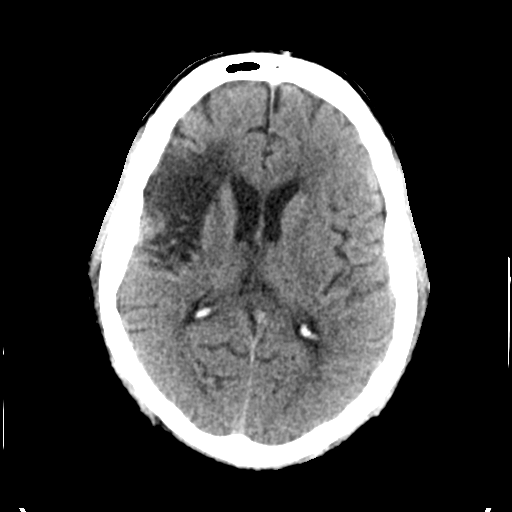
[im 21/33  brain]
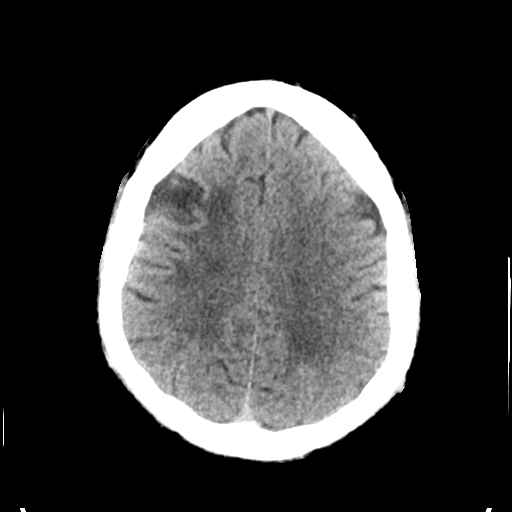
[im 21/33  bone]
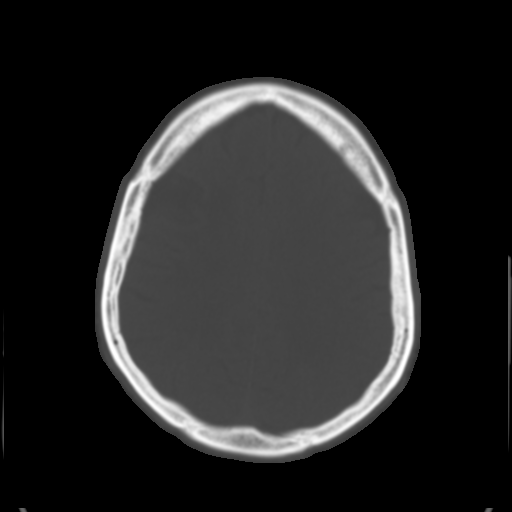
[im 25/33  brain]
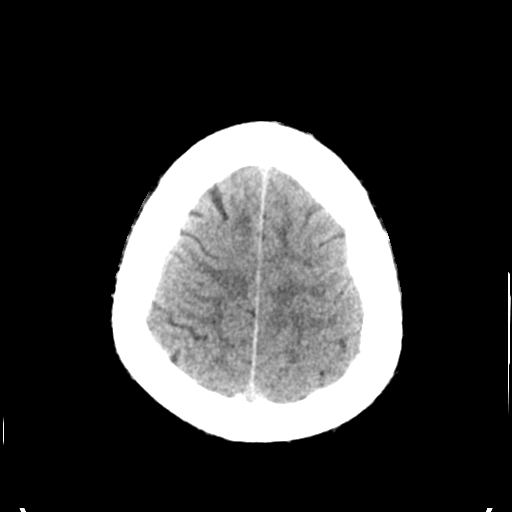
[im 29/33  brain]
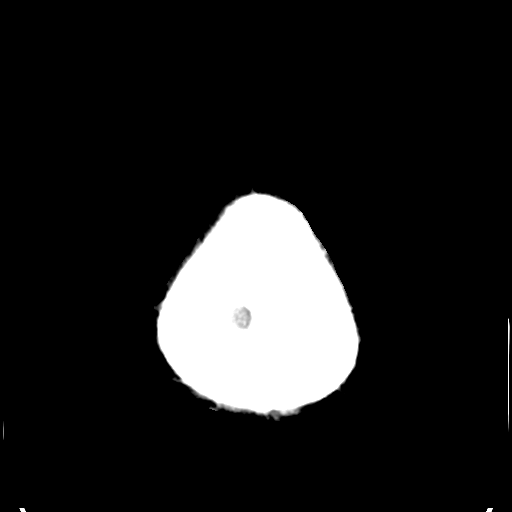

[Series 3: head bone · axial · 0.45mm/px · z∈[+759,+887]mm · 8 of 81 slices shown]
[im 9/81  bone]
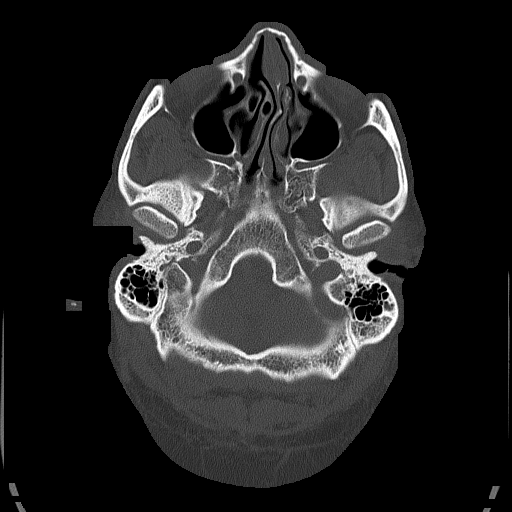
[im 17/81  bone]
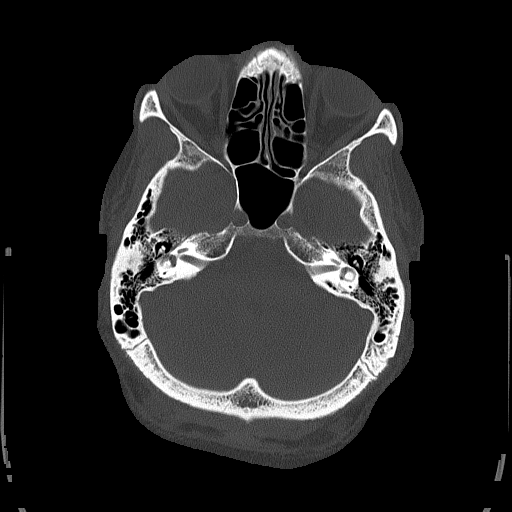
[im 25/81  bone]
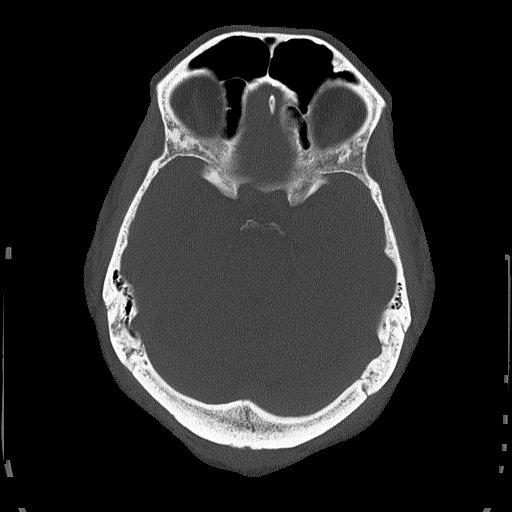
[im 37/81  bone]
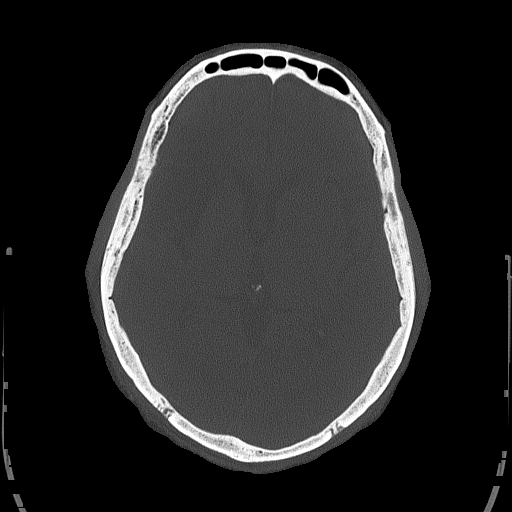
[im 45/81  bone]
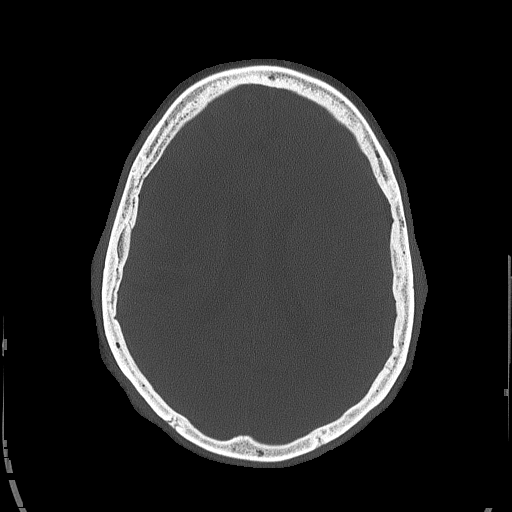
[im 57/81  bone]
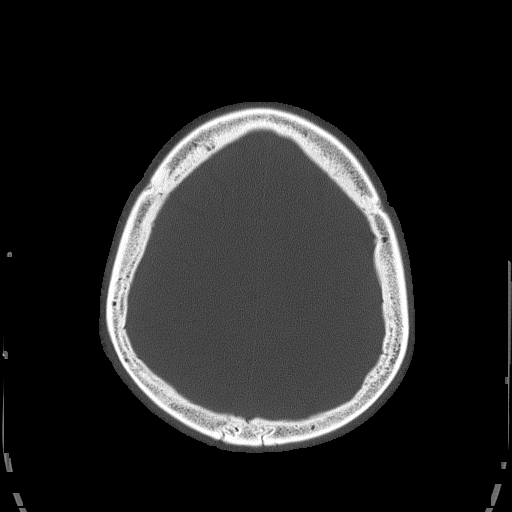
[im 65/81  bone]
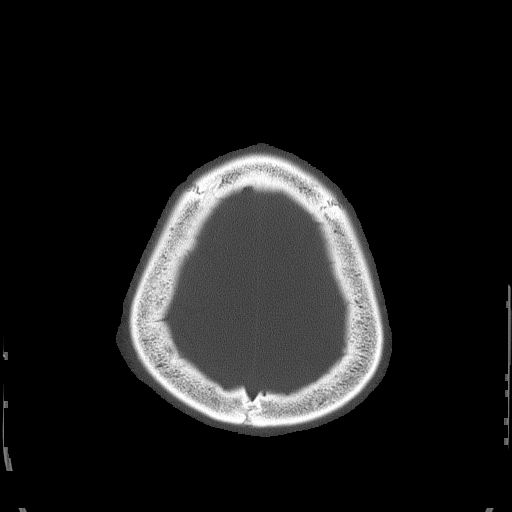
[im 73/81  bone]
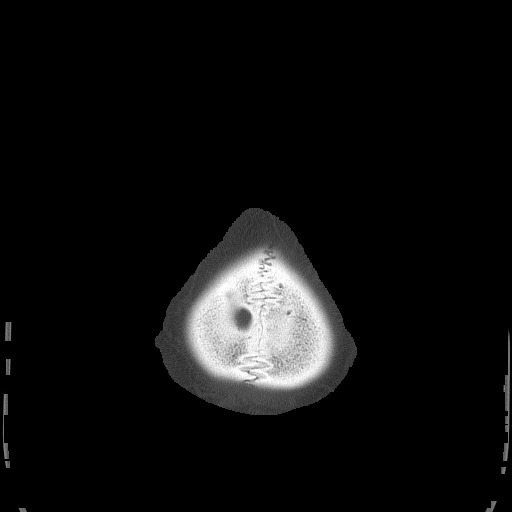

[15 of 30 positions shown; findings below may reference images not displayed]

FINDINGS: The ventricles and sulci are appropriate in size for patient's age.
Mild periventricular and deep white matter chronic microvascular
ischemic changes noted. There is an area of hypodensity in the right
frontal lobe in the region of the right MCA territory compatible
with an old infarct and encephalomalacia. There is a 5 mm high
attenuating segment of the M2 segment of the right MCA (series 2,
image 12) which may be artifactual and related to prominence of the
vessels on-end background of encephalomalacia. A M2 segment thrombus
is not excluded. Correlation with clinical exam recommended. CT
angiography may provide better evaluation if clinically indicated.
There is no acute intrapulmonary hemorrhage. No mass effect or
midline shift identified.

The visualized paranasal sinuses and mastoid air cells are clear.
The calvarium is intact.
IMPRESSION: No acute intracranial hemorrhage.

Large area of right MCA territory old infarct and encephalomalacia.

A 5 mm segmental high attenuation in the M2 segment of the right MCA
which may be artifactual. CT angiography may provide better
evaluation if clinically indicated.

## 2017-08-09 ENCOUNTER — Ambulatory Visit: Payer: Medicare HMO

## 2017-08-10 ENCOUNTER — Ambulatory Visit (INDEPENDENT_AMBULATORY_CARE_PROVIDER_SITE_OTHER): Payer: Medicare HMO

## 2017-08-10 DIAGNOSIS — Z23 Encounter for immunization: Secondary | ICD-10-CM | POA: Diagnosis not present

## 2017-08-10 DIAGNOSIS — Z0000 Encounter for general adult medical examination without abnormal findings: Secondary | ICD-10-CM

## 2017-08-10 NOTE — Patient Instructions (Addendum)
[name] [name] , Thank you for taking time to come for your Medicare Wellness Visit. I appreciate your ongoing commitment to your health goals. Please review the following plan we discussed and let me know if I can assist you in the future.   Please let Dr. Acie Fredrickson know about stopping the Lasix and hydralazine   Will try to complete AD; Given copy  Referred to [facility] for questions [place] offers free advance directive forms, as well as assistance in completing the forms themselves. For assistance, contact the Spiritual Care Department at [phone], or the Clinical Social Work Department at [phone].  Ask [employer] who you need to see to have your vision checked and how much will it cost out of your pocket?  Deaf & Hard of Hearing Division Services - can assist with hearing aid x 1  No reviews  [employer] Office  [address]  [phone]  http://clienthiadev.devcloud.acquia-[email or web address]/sites/default/files/hearingpedia/Guide_How_to_Buy_Hearing_Aids.pdf  A Tetanus is recommended every 10 years. Has one in 2001  Medicare covers a tetanus if you have a cut or wound; otherwise, there may be a charge. If you had not had a tetanus with pertusses, known as the Tdap, you can take this anytime.   Educated to check with insurance regarding coverage of Shingles vaccination on Part D or Part B and may have lower co-pay if provided on the Part D side    These are the goals we discussed: Goals    . Blood Pressure < 150/90    . Weight (lb) < 250 lb (113.4 kg)     Eats health diet  Vegan diet with lean meats  Fruits        This is a list of the screening recommended for you and due dates:  Health Maintenance  Topic Date Due  . Pneumonia vaccines (1 of 2 - PCV13) 02/14/2017  . Tetanus Vaccine  11/03/2017*  . Flu Shot  08/24/2017  . Colon Cancer Screening  09/03/2024  .  Hepatitis C: One time screening is recommended by Center for Disease Control  (CDC) for  adults born  from 50 through 1965.   Completed  . HIV Screening  Completed  *Topic was postponed. The date shown is not the original due date.      Fall Prevention in the Home Falls can cause injuries. They can happen to people of all ages. There are many things you can do to make your home safe and to help prevent falls. What can I do on the outside of my home?  Regularly fix the edges of walkways and driveways and fix any cracks.  Remove anything that might make you trip as you walk through a door, such as a raised step or threshold.  Trim any bushes or trees on the path to your home.  Use bright outdoor lighting.  Clear any walking paths of anything that might make someone trip, such as rocks or tools.  Regularly check to see if handrails are loose or broken. Make sure that both sides of any steps have handrails.  Any raised decks and porches should have guardrails on the edges.  Have any leaves, snow, or ice cleared regularly.  Use sand or salt on walking paths during winter.  Clean up any spills in your garage right away. This includes oil or grease spills. What can I do in the bathroom?  Use night lights.  Install grab bars by the toilet and in the tub and shower. Do not use towel  bars as grab bars.  Use non-skid mats or decals in the tub or shower.  If you need to sit down in the shower, use a plastic, non-slip stool.  Keep the floor dry. Clean up any water that spills on the floor as soon as it happens.  Remove soap buildup in the tub or shower regularly.  Attach bath mats securely with double-sided non-slip rug tape.  Do not have throw rugs and other things on the floor that can make you trip. What can I do in the bedroom?  Use night lights.  Make sure that you have a light by your bed that is easy to reach.  Do not use any sheets or blankets that are too big for your bed. They should not hang down onto the floor.  Have a firm chair that has side arms. You can use  this for support while you get dressed.  Do not have throw rugs and other things on the floor that can make you trip. What can I do in the kitchen?  Clean up any spills right away.  Avoid walking on wet floors.  Keep items that you use a lot in easy-to-reach places.  If you need to reach something above you, use a strong step stool that has a grab bar.  Keep electrical cords out of the way.  Do not use floor polish or wax that makes floors slippery. If you must use wax, use non-skid floor wax.  Do not have throw rugs and other things on the floor that can make you trip. What can I do with my stairs?  Do not leave any items on the stairs.  Make sure that there are handrails on both sides of the stairs and use them. Fix handrails that are broken or loose. Make sure that handrails are as long as the stairways.  Check any carpeting to make sure that it is firmly attached to the stairs. Fix any carpet that is loose or worn.  Avoid having throw rugs at the top or bottom of the stairs. If you do have throw rugs, attach them to the floor with carpet tape.  Make sure that you have a light switch at the top of the stairs and the bottom of the stairs. If you do not have them, ask someone to add them for you. What else can I do to help prevent falls?  Wear shoes that: ? Do not have high heels. ? Have rubber bottoms. ? Are comfortable and fit you well. ? Are closed at the toe. Do not wear sandals.  If you use a stepladder: ? Make sure that it is fully opened. Do not climb a closed stepladder. ? Make sure that both sides of the stepladder are locked into place. ? Ask someone to hold it for you, if possible.  Clearly mark and make sure that you can see: ? Any grab bars or handrails. ? First and last steps. ? Where the edge of each step is.  Use tools that help you move around (mobility aids) if they are needed. These include: ? Canes. ? Walkers. ? Scooters. ? Crutches.  Turn on  the lights when you go into a dark area. Replace any light bulbs as soon as they burn out.  Set up your furniture so you have a clear path. Avoid moving your furniture around.  If any of your floors are uneven, fix them.  If there are any pets around you, be aware of where they  are.  Review your medicines with your doctor. Some medicines can make you feel dizzy. This can increase your chance of falling. Ask your doctor what other things that you can do to help prevent falls. This information is not intended to replace advice given to you by your health care provider. Make sure you discuss any questions you have with your health care provider. Document Released: 11/06/2008 Document Revised: 06/18/2015 Document Reviewed: 02/14/2014 Elsevier Interactive Patient Education  2018 [place] Maintenance, Male A healthy lifestyle and preventive care is important for your health and wellness. Ask your health care provider about what schedule of regular examinations is right for you. What should I know about weight and diet? Eat a Healthy Diet  Eat plenty of vegetables, fruits, whole grains, low-fat dairy products, and lean protein.  Do not eat a lot of foods high in solid fats, added sugars, or salt.  Maintain a Healthy Weight Regular exercise can help you achieve or maintain a healthy weight. You should:  Do at least 150 minutes of exercise each week. The exercise should increase your heart rate and make you sweat (moderate-intensity exercise).  Do strength-training exercises at least twice a week.  Watch Your Levels of Cholesterol and Blood Lipids  Have your blood tested for lipids and cholesterol every 5 years starting at 65 years of age. If you are at high risk for heart disease, you should start having your blood tested when you are 65 years old. You may need to have your cholesterol levels checked more often if: ? Your lipid or cholesterol levels are high. ? You are older  than 65 years of age. ? You are at high risk for heart disease.  What should I know about cancer screening? Many types of cancers can be detected early and may often be prevented. Lung Cancer  You should be screened every year for lung cancer if: ? You are a current smoker who has smoked for at least 30 years. ? You are a former smoker who has quit within the past 15 years.  Talk to your health care provider about your screening options, when you should start screening, and how often you should be screened.  Colorectal Cancer  Routine colorectal cancer screening usually begins at 65 years of age and should be repeated every 5-10 years until you are 65 years old. You may need to be screened more often if early forms of precancerous polyps or small growths are found. Your health care provider may recommend screening at an earlier age if you have risk factors for colon cancer.  Your health care provider may recommend using home test kits to check for hidden blood in the stool.  A small camera at the end of a tube can be used to examine your colon (sigmoidoscopy or colonoscopy). This checks for the earliest forms of colorectal cancer.  Prostate and Testicular Cancer  Depending on your age and overall health, your health care provider may do certain tests to screen for prostate and testicular cancer.  Talk to your health care provider about any symptoms or concerns you have about testicular or prostate cancer.  Skin Cancer  Check your skin from head to toe regularly.  Tell your health care provider about any new moles or changes in moles, especially if: ? There is a change in a mole's size, shape, or color. ? You have a mole that is larger than a pencil eraser.  Always use sunscreen. Apply sunscreen liberally  and repeat throughout the day.  Protect yourself by wearing long sleeves, pants, a wide-brimmed hat, and sunglasses when outside.  What should I know about heart disease,  diabetes, and high blood pressure?  If you are 92-21 years of age, have your blood pressure checked every 3-5 years. If you are 62 years of age or older, have your blood pressure checked every year. You should have your blood pressure measured twice-once when you are at a hospital or clinic, and once when you are not at a hospital or clinic. Record the average of the two measurements. To check your blood pressure when you are not at a hospital or clinic, you can use: ? An automated blood pressure machine at a pharmacy. ? A home blood pressure monitor.  Talk to your health care provider about your target blood pressure.  If you are between 63-74 years old, ask your health care provider if you should take aspirin to prevent heart disease.  Have regular diabetes screenings by checking your fasting blood sugar level. ? If you are at a normal weight and have a low risk for diabetes, have this test once every three years after the age of 7. ? If you are overweight and have a high risk for diabetes, consider being tested at a younger age or more often.  A one-time screening for abdominal aortic aneurysm (AAA) by ultrasound is recommended for men aged 19-75 years who are current or former smokers. What should I know about preventing infection? Hepatitis B If you have a higher risk for hepatitis B, you should be screened for this virus. Talk with your health care provider to find out if you are at risk for hepatitis B infection. Hepatitis C Blood testing is recommended for:  Everyone born from [birth year] through 1963.  Anyone with known risk factors for hepatitis C.  Sexually Transmitted Diseases (STDs)  You should be screened each year for STDs including gonorrhea and chlamydia if: ? You are sexually active and are younger than 65 years of age. ? You are older than 65 years of age and your health care provider tells you that you are at risk for this type of infection. ? Your sexual activity has  changed since you were last screened and you are at an increased risk for chlamydia or gonorrhea. Ask your health care provider if you are at risk.  Talk with your health care provider about whether you are at high risk of being infected with HIV. Your health care provider may recommend a prescription medicine to help prevent HIV infection.  What else can I do?  Schedule regular health, dental, and eye exams.  Stay current with your vaccines (immunizations).  Do not use any tobacco products, such as cigarettes, chewing tobacco, and e-cigarettes. If you need help quitting, ask your health care provider.  Limit alcohol intake to no more than 2 drinks per day. One drink equals 12 ounces of beer, 5 ounces of wine, or 1 ounces of hard liquor.  Do not use street drugs.  Do not share needles.  Ask your health care provider for help if you need support or information about quitting drugs.  Tell your health care provider if you often feel depressed.  Tell your health care provider if you have ever been abused or do not feel safe at home. This information is not intended to replace advice given to you by your health care provider. Make sure you discuss any questions you have with your health  care provider. Document Released: 07/09/2007 Document Revised: 09/09/2015 Document Reviewed: 10/14/2014 Elsevier Interactive Patient Education  2018 Elsevier Inc.  

## 2017-08-10 NOTE — Progress Notes (Signed)
Subjective:   Amilcar Reever is a 67 y.o. male who presents for Medicare Annual/Subsequent preventive examination.  Reports health as getting better Stroke in [place] 2 children live here  Son and dtr lives here    C.o of issues with med  Stopped lasix due to voiding all the time ( has been off x 1 month now) stopped hydralazine due to what he thinks was an allergy Would advise he call Dr. Acie Fredrickson that you have stopped your medicine  Starting to exercise [facility] [employer] [facility] days a [facility] Does some cardio and then gets in the pool C/o of some issues with knee right ;recommends a knee replacement and hopes some this will help his knee   Sleeping ; sleeps better   Diet Trying to lose weight which is in range from his last visits Breakfast fruits Lunch salads vegetarian diet but eats fish and chicken   There are no preventive care reminders to display for this patient.   Had prevnar 45 today  Cardiac Risk Factors include: advanced age (>47men, >25 women);dyslipidemia;family history of premature cardiovascular disease;hypertension;male gender;obesity (BMI >30kg/m2)     Objective:    Vitals: BP 100/60   Pulse 63   Ht 6\' 4"  (1.93 m)   Wt 266 lb 6 oz (120.8 kg)   SpO2 94%   BMI 32.42 kg/m   Body mass index is 32.42 kg/m.  Advanced Directives 08/10/2017 11/15/2016 09/14/2016 07/14/2016 05/12/2016 01/28/2016 10/16/2015  Does Patient Have a Medical Advance Directive? No No No No No No No  Does patient want to make changes to medical advance directive? - - - - - - -  Would patient like information on creating a medical advance directive? - No - Patient declined No - Patient declined Yes (MAU/Ambulatory/Procedural Areas - Information given) - Yes (MAU/Ambulatory/Procedural Areas - Information given) No - patient declined information  Pre-existing out of facility DNR order (yellow form or pink MOST form) - - - - - - -    Tobacco Social History    Tobacco Use  Smoking Status Never Smoker  Smokeless Tobacco Never Used     Counseling given: Yes   Clinical Intake:     Past Medical History:  Diagnosis Date  . Arthritis    "left hip; right knee" (10/09/2013)  . Asthma    'as a child"   . Blood clotting disorder (HCC)    pt. states his family has a history of "slow clotting", had bleeding after oral surgery age 44 but none with extractions 2015 or adult surgeries; thought father (now deceased) had a hx of hemophilia  . Brain aneurysm 01/11/1989  . Brain aneurysm   . CHF (congestive heart failure) ([place])   . Daily headache    are due to the medication he is taking  . Full dentures   . History of gout   . Hypertension   . Shortness of breath    "comes on at anytime" (01/18/2013)  . Sickle cell trait ([place])   . Stroke ([place]) 12/1988  . Systolic heart failure (HCC)    EF is 35 to 40%  . Wears glasses    Past Surgical History:  Procedure Laterality Date  . CARDIAC CATHETERIZATION  12/1988; 12/2012  . COLONOSCOPY WITH PROPOFOL N/A 09/04/2014   Procedure: COLONOSCOPY WITH PROPOFOL;  Surgeon: [name], MD;  Location: [facility];  Service: Endoscopy;  Laterality: N/A;  . [place]  .  HERNIA REPAIR  2004?   "above the navel"   . LEFT AND RIGHT HEART CATHETERIZATION WITH CORONARY ANGIOGRAM N/A 01/21/2013   Procedure: LEFT AND RIGHT HEART CATHETERIZATION WITH CORONARY ANGIOGRAM;  Surgeon: Minus Breeding, MD;  Location[facility] CATH LAB;  Service: Cardiovascular;  Laterality: N/A;  . MULTIPLE TOOTH EXTRACTIONS  spring 2015   "took out 7"  . MUSCLE BIOPSY N/A 12/06/2013   Procedure: FAT PAD BIOPSY;  Surgeon: Erroll Luna, MD;  Location: [facility];  Service: General;  Laterality: N/A;  . NM PET  LYMPHOMA INITIAL  2004?   "benign"  . TOTAL HIP ARTHROPLASTY Left 10/08/2013   anterior approach  . TOTAL HIP ARTHROPLASTY Left 10/08/2013   Procedure: TOTAL HIP ARTHROPLASTY ANTERIOR APPROACH;  Surgeon:  Renette Butters, MD;  Location: [facility];  Service: Orthopedics;  Laterality: Left;   Family History  Problem Relation Age of Onset  . Heart attack Mother   . Hypertension Mother   . Varicose Veins Mother   . Cancer Father   . Hypertension Father   . Bleeding Disorder Father   . Hypertension Sister   . Cancer Brother   . Hypertension Brother   . Hyperlipidemia Brother    Social History   Socioeconomic History  . Marital status: Married    Spouse name: Gregary Signs  . Number of children: 4  . Years of education: Assoc.  . Highest education level: Not on file  Occupational History    Comment: not employed  Social Needs  . Financial resource strain: Not on file  . Food insecurity:    Worry: Not on file    Inability: Not on file  . Transportation needs:    Medical: Not on file    Non-medical: Not on file  Tobacco Use  . Smoking status: Never Smoker  . Smokeless tobacco: Never Used  Substance and Sexual Activity  . Alcohol use: No    Alcohol/week: 0.0 oz  . Drug use: Yes    Types: Marijuana    Comment: "stopped smoking marijuana in the late 1990's"  . Sexual activity: Yes  Lifestyle  . Physical activity:    Days per week: Not on file    Minutes per session: Not on file  . Stress: Not on file  Relationships  . Social connections:    Talks on phone: Not on file    Gets together: Not on file    Attends religious service: Not on file    Active member of club or organization: Not on file    Attends meetings of clubs or organizations: Not on file    Relationship status: Not on file  Other Topics Concern  . Not on file  Social History Narrative   Patient consumes caffeine occas.    Outpatient Encounter Medications as of 08/10/2017  Medication Sig  . allopurinol (ZYLOPRIM) 100 MG tablet Take 1 tablet (100 mg total) by mouth daily.  [name] atorvastatin (LIPITOR) 20 MG tablet TAKE 1 TABLET (20 MG TOTAL) BY MOUTH DAILY AT 6 PM.  . carvedilol (COREG) 25 MG tablet TAKE 1 TABLET BY  MOUTH TWICE DAILY WITH A MEAL  . lisinopril (PRINIVIL,ZESTRIL) 10 MG tablet Take 1 tablet (10 mg total) by mouth daily.  [name] spironolactone (ALDACTONE) 25 MG tablet Take 1 tablet (25 mg total) by mouth daily.  . [DISCONTINUED] atorvastatin (LIPITOR) 20 MG tablet Take 1 tablet (20 mg total) by mouth daily at 6 PM.  . furosemide (LASIX) 40 MG tablet Take 1  tablet (40 mg total) by mouth 3 (three) times a week. (Patient not taking: Reported on 08/10/2017)  . hydrALAZINE (APRESOLINE) 50 MG tablet Take 1.5 tablets (75 mg total) by mouth 3 (three) times daily. (Patient not taking: Reported on 08/10/2017)   No facility-administered encounter medications on file as of 08/10/2017.     Activities of Daily Living In your present state of health, do you have any difficulty performing the following activities: 08/10/2017 09/14/2016  Hearing? (No Data) N  Comment had hearing aid -  Vision? N N  Comment good but needs to have it checked -  Difficulty concentrating or making decisions? N N  Walking or climbing stairs? N N  Dressing or bathing? N N  Doing errands, shopping? N N  Preparing Food and eating ? N -  Using the Toilet? N -  In the past six months, have you accidently leaked urine? N -  Do you have problems with loss of bowel control? N -  Managing your Medications? N -  Managing your Finances? N -  Housekeeping or managing your Housekeeping? N -  Some recent data might be hidden    Patient Care Team: Vivi Barrack, MD as PCP - General (Family Medicine)   Assessment:   This is a routine wellness examination for [employer].  Exercise Activities and Dietary recommendations Current Exercise Habits: Structured exercise class, Type of exercise: strength training/weights;walking;Other - see comments, Time (Minutes): 60, Frequency (Times/Week): 3, Weekly Exercise (Minutes/Week): 180, Intensity: Moderate  Goals    . Blood Pressure < 150/90    . Weight (lb) < 250 lb (113.4 kg)     Eats health diet   Vegan diet with lean meats  Fruits        Fall Risk Fall Risk  08/10/2017 11/03/2016 09/14/2016 07/14/2016 01/28/2016  Falls in the past year? No No No No No  Number falls in past yr: - - - - -  Comment - - - - -  Injury with Fall? - - - - -  Risk for fall due to : - - - - -     Depression Screen PHQ 2/9 Scores 08/10/2017 11/03/2016 09/14/2016 07/14/2016  PHQ - 2 Score 0 0 0 0    Cognitive Function MMSE - Mini Mental State Exam 08/10/2017  Not completed: (No Data)    no issues with cognition     Immunization History  Administered Date(s) Administered  . Influenza,inj,Quad PF,6+ Mos 03/01/2013, 10/09/2013, 01/28/2016, 11/03/2016  . Pneumococcal Conjugate-13 08/10/2017  . Pneumococcal Polysaccharide-23 01/20/2013     Screening Tests Health Maintenance  Topic Date Due  . TETANUS/TDAP  11/03/2017 (Originally 02/15/1969)  . INFLUENZA VACCINE  08/24/2017  . PNA vac Low Risk Adult (2 of 2 - PPSV23) 08/11/2018  . COLONOSCOPY  09/03/2024  . Hepatitis C Screening  Completed  . HIV Screening  Completed       Plan:      PCP Notes   Health Maintenance Prevnar 13 given today Discussed eye exam and he will make an apt  Educated regarding shingrix - will take at the pharmacy He did have chicken pox Educated regarding tdap - declined here  Using his cpap   Abnormal Screens  CHF patient. See note below   Referrals  none  Patient concerns; Stopped lasix due to voiding all the time ( has been off x 1 month now) stopped hydralazine due to what he thinks was an allergy  Advised him to call Dr. Acie Fredrickson to  report discontinuing the meds  States he will see Dr. Acie Fredrickson at the end of the month. Weight is actually down, trying to lose more weight. Eating well; no edema , no sob     Nurse Concerns; As noted  Next PCP apt Was seen 01/2017  Apt with Cardiology on 08/23/2017      I have personally reviewed and noted the following in the patient's chart:   . Medical  and social history . Use of alcohol, tobacco or illicit drugs  . Current medications and supplements . Functional ability and status . Nutritional status . Physical activity . Advanced directives . List of other physicians . Hospitalizations, surgeries, and ER visits in previous 12 months . Vitals . Screenings to include cognitive, depression, and falls . Referrals and appointments  In addition, I have reviewed and discussed with patient certain preventive protocols, quality metrics, and best practice recommendations. A written personalized care plan for preventive services as well as general preventive health recommendations were provided to patient.     Wynetta Fines, RN  08/10/2017

## 2017-08-11 NOTE — Progress Notes (Signed)
I have personally reviewed the Medicare Annual Wellness Visit and agree with the assessment and plan.  Algis Greenhouse. Jerline Pain, MD 08/11/2017 8:02 AM

## 2017-08-23 ENCOUNTER — Encounter: Payer: Self-pay | Admitting: Nurse Practitioner

## 2017-08-23 ENCOUNTER — Ambulatory Visit: Payer: Medicare HMO | Admitting: Nurse Practitioner

## 2017-08-23 VITALS — BP 120/78 | HR 76 | Ht 76.0 in | Wt 268.4 lb

## 2017-08-23 DIAGNOSIS — I110 Hypertensive heart disease with heart failure: Secondary | ICD-10-CM | POA: Diagnosis not present

## 2017-08-23 DIAGNOSIS — I2510 Atherosclerotic heart disease of native coronary artery without angina pectoris: Secondary | ICD-10-CM

## 2017-08-23 DIAGNOSIS — I10 Essential (primary) hypertension: Secondary | ICD-10-CM

## 2017-08-23 DIAGNOSIS — I5042 Chronic combined systolic (congestive) and diastolic (congestive) heart failure: Secondary | ICD-10-CM

## 2017-08-23 DIAGNOSIS — E782 Mixed hyperlipidemia: Secondary | ICD-10-CM | POA: Diagnosis not present

## 2017-08-23 NOTE — Patient Instructions (Addendum)
We will be checking the following labs today - NONE   Medication Instructions:    Continue with your current medicines.     Testing/Procedures To Be Arranged:  N/A  Follow-Up:   See Dr. Acie Fredrickson in 6 months    Other Special Instructions:   Keep up the good work.     If you need a refill on your cardiac medications before your next appointment, please call your pharmacy.   Call the [place] office at [phone] if you have any questions, problems or concerns.

## 2017-08-23 NOTE — Progress Notes (Signed)
CARDIOLOGY OFFICE NOTE  Date:  08/23/2017    [name] Date of Birth: [birth year] Medical Record #[id number]  PCP:  Vivi Barrack, MD  Cardiologist:  N[place]  Chief Complaint  Patient presents with  . Congestive Heart Failure  . Coronary Artery Disease    Follow up visit - seen for Dr. Acie Fredrickson    History of Present Illness: Pauline Trainer is a 67 y.o. male who presents today for a follow up visit. Seen for Dr. Acie Fredrickson.   He has a hx of HTN, asthma, prior stroke, nonischemic cardiomyopathy, & combined systolic and diastolic HF.   His EF has been as low as 25-30% by echocardiogram in 2015. Cardiac catheterization in 2014 demonstrated mild nonobstructive CAD with mid RCA 40% stenosis. Most recent echocardiogram in 2/18 demonstrated normal LV function with EF 50-55% and severe LVH.  He has had issues with labile BP and has had to have medicines adjusted.   Last seen by Dr. Acie Fredrickson in December - felt to be doing well at that time.   Comes in today. Here with his wife. He is doing well. He has had some issues with his knee and had been advised to have knee surgery but he did not wish to do this - really worked on diet and has been successful with weight loss. He has cut out his last dose of Hydralazine due to itching. BP is great. No longer on Lasix as well - he had had some incontinence with this - he does still have if he were to need.   Past Medical History:  Diagnosis Date  . Arthritis    "left hip; right knee" (10/09/2013)  . Asthma    'as a child"   . Blood clotting disorder (HCC)    pt. states his family has a history of "slow clotting", had bleeding after oral surgery age 58 but none with extractions 2015 or adult surgeries; thought father (now deceased) had a hx of hemophilia  . Brain aneurysm 01/11/1989  . Brain aneurysm   . CHF (congestive heart failure) ([place])   . Daily headache    are due to the medication he is taking  . Full dentures   . History of gout     . Hypertension   . Shortness of breath    "comes on at anytime" (01/18/2013)  . Sickle cell trait ([place])   . Stroke ([place]) 12/1988  . Systolic heart failure (HCC)    EF is 35 to 40%  . Wears glasses     Past Surgical History:  Procedure Laterality Date  . CARDIAC CATHETERIZATION  12/1988; 12/2012  . COLONOSCOPY WITH PROPOFOL N/A 09/04/2014   Procedure: COLONOSCOPY WITH PROPOFOL;  Surgeon: [name], MD;  Location: [facility];  Service: Endoscopy;  Laterality: N/A;  . [place]  . HERNIA REPAIR  2004?   "above the navel"   . LEFT AND RIGHT HEART CATHETERIZATION WITH CORONARY ANGIOGRAM N/A 01/21/2013   Procedure: LEFT AND RIGHT HEART CATHETERIZATION WITH CORONARY ANGIOGRAM;  Surgeon: Minus Breeding, MD;  Location: [facility] CATH LAB;  Service: Cardiovascular;  Laterality: N/A;  . MULTIPLE TOOTH EXTRACTIONS  spring 2015   "took out 7"  . MUSCLE BIOPSY N/A 12/06/2013   Procedure: FAT PAD BIOPSY;  Surgeon: Erroll Luna, MD;  Location: [place];  Service: General;  Laterality: N/A;  . NM PET  LYMPHOMA INITIAL  2004?   "benign"  . TOTAL HIP ARTHROPLASTY Left 10/08/2013  anterior approach  . TOTAL HIP ARTHROPLASTY Left 10/08/2013   Procedure: TOTAL HIP ARTHROPLASTY ANTERIOR APPROACH;  Surgeon: Renette Butters, MD;  Location: [facility];  Service: Orthopedics;  Laterality: Left;     Medications: Current Meds  Medication Sig  . allopurinol (ZYLOPRIM) 100 MG tablet Take 1 tablet (100 mg total) by mouth daily.  [name] atorvastatin (LIPITOR) 20 MG tablet TAKE 1 TABLET (20 MG TOTAL) BY MOUTH DAILY AT 6 PM.  . carvedilol (COREG) 25 MG tablet TAKE 1 TABLET BY MOUTH TWICE DAILY WITH A MEAL  . hydrALAZINE (APRESOLINE) 50 MG tablet Take 75 mg by mouth 2 (two) times daily.  [name] lisinopril (PRINIVIL,ZESTRIL) 10 MG tablet Take 1 tablet (10 mg total) by mouth daily.  [name] spironolactone (ALDACTONE) 25 MG tablet Take 1 tablet (25 mg total) by mouth daily.     Allergies: Allergies   Allergen Reactions  . Hydrocodone Itching    Itch and crazy  . Hydrocodone-Acetaminophen Hives    Social History: The patient  reports that he has never smoked. He has never used smokeless tobacco. He reports that he has current or past drug history. Drug: Marijuana. He reports that he does not drink alcohol.   Family History: The patient's family history includes Bleeding Disorder in his father; Cancer in his brother and father; Heart attack in his mother; Hyperlipidemia in his brother; Hypertension in his brother, father, mother, and sister; Varicose Veins in his mother.   Review of Systems: Please see the history of present illness.   Otherwise, the review of systems is positive for none.   All other systems are reviewed and negative.   Physical Exam: VS:  BP 120/78 (BP Location: Left Arm, Patient Position: Sitting, Cuff Size: Normal)   Pulse 76   Ht 6\' 4"  (1.93 m)   Wt 268 lb 6.4 oz (121.7 kg)   SpO2 95% Comment: at rest  BMI 32.67 kg/m  .  BMI Body mass index is 32.67 kg/m.  Wt Readings from Last 3 Encounters:  08/23/17 268 lb 6.4 oz (121.7 kg)  08/10/17 266 lb 6 oz (120.8 kg)  01/26/17 277 lb 3.2 oz (125.7 kg)    General: Pleasant. Well developed, well nourished and in no acute distress. Down 9 pounds since January.   HEENT: Normal.  Neck: Supple, no JVD, carotid bruits, or masses noted.  Cardiac: Regular rate and rhythm. No murmurs, rubs, or gallops. No edema.  Respiratory:  Lungs are clear to auscultation bilaterally with normal work of breathing.  GI: Soft and nontender.  MS: No deformity or atrophy. Gait and ROM intact.  Skin: Warm and dry. Color is normal.  Neuro:  Strength and sensation are intact and no gross focal deficits noted.  Psych: Alert, appropriate and with normal affect.   LABORATORY DATA:  EKG:  EKG is not ordered today.  Lab Results  Component Value Date   WBC 12.0 (H) 09/28/2016   HGB 13.8 09/28/2016   HCT 40.5 09/28/2016   PLT 268  09/28/2016   GLUCOSE 103 (H) 07/13/2017   CHOL 139 07/13/2017   TRIG 82 07/13/2017   HDL 35 (L) 07/13/2017   LDLCALC 88 07/13/2017   ALT 20 07/13/2017   AST 20 07/13/2017   NA 141 07/13/2017   K 5.1 07/13/2017   CL 104 07/13/2017   CREATININE 1.17 07/13/2017   BUN 11 07/13/2017   CO2 24 07/13/2017   TSH 1.080 09/28/2016   PSA 2.49 05/22/2013   INR 1.09 09/27/2013  HGBA1C 5.6 01/28/2016     BNP (last 3 results) No results for input(s): BNP in the last 8760 hours.  ProBNP (last 3 results) No results for input(s): PROBNP in the last 8760 hours.   Other Studies Reviewed Today:  Echo Study Conclusions 02/2016  - Left ventricle: The cavity size was normal. There was severe   concentric hypertrophy. Systolic function was normal. The   estimated ejection fraction was in the range of 50% to 55%.   Diffuse hypokinesis. The study is not technically sufficient to   allow evaluation of LV diastolic function. - Aortic valve: Transvalvular velocity was within the normal range.   There was no stenosis. There was trivial regurgitation. - Mitral valve: Transvalvular velocity was within the normal range.   There was no evidence for stenosis. There was no regurgitation. - Right ventricle: Systolic function was normal. - Atrial septum: No defect or patent foramen ovale was identified   by color flow Doppler. - Tricuspid valve: There was trivial regurgitation. - Pulmonary arteries: Systolic pressure was within the normal   range. PA peak pressure: 26 mm Hg (S).   Carotid US 10/16/15 Bilateral ICA 1-39; R subclavian velocity increased  Echocardiogram 12/15 Severe LVH, EF 30-35, diffuse HK, grade 1 diastolic dysfunction, trivial AI, mildly dilated aortic root, mildly dilated ascending aorta, mild BAE  R/L Cardiac Catheterization 01/21/13  RCA luminal irregularities, mid 40 Mean PA 42, PCWP 26, LVEDP 29  Echocardiogram 6/15 EF 25-30, PASP 44, Gr 2 DD   Assessment/Plan:  1.  HTN - his BP is great. No changes made. He will continue to monitor. He is on less hydralazine.   2. Chronic combined systolic and diastolic HF - last EF normal. NYHA I/II - doing very well. No changes made today. Looks very compensated.   3. CKD - labs from June noted.   4. Nonobstructive CAD - he has no active chest pain. Would favor continued CV risk factor modification which he is currently being successful with.   5. Intentional weight loss. Encouraged him to keep up the good work.   Current medicines are reviewed with the patient today.  The patient does not have concerns regarding medicines other than what has been noted above.  The following changes have been made:  See above.  Labs/ tests ordered today include:   No orders of the defined types were placed in this encounter.    Disposition:   FU with Dr. Acie Fredrickson in 6 months.   Patient is agreeable to this plan and will call if any problems develop in the interim.   SignedTruitt Merle, NP  08/23/2017 8:53 AM  [facility] [address] [place], [place]  [address] Phone: [phone] Fax: [phone]

## 2017-11-24 ENCOUNTER — Ambulatory Visit (INDEPENDENT_AMBULATORY_CARE_PROVIDER_SITE_OTHER): Payer: Medicare HMO | Admitting: Family Medicine

## 2017-11-24 ENCOUNTER — Encounter: Payer: Self-pay | Admitting: Family Medicine

## 2017-11-24 ENCOUNTER — Other Ambulatory Visit: Payer: Self-pay | Admitting: Family Medicine

## 2017-11-24 VITALS — BP 124/80 | HR 68 | Temp 98.1°F | Ht 76.0 in | Wt 272.2 lb

## 2017-11-24 DIAGNOSIS — Z125 Encounter for screening for malignant neoplasm of prostate: Secondary | ICD-10-CM

## 2017-11-24 DIAGNOSIS — I130 Hypertensive heart and chronic kidney disease with heart failure and stage 1 through stage 4 chronic kidney disease, or unspecified chronic kidney disease: Secondary | ICD-10-CM | POA: Diagnosis not present

## 2017-11-24 DIAGNOSIS — M1A9XX0 Chronic gout, unspecified, without tophus (tophi): Secondary | ICD-10-CM | POA: Diagnosis not present

## 2017-11-24 DIAGNOSIS — E7800 Pure hypercholesterolemia, unspecified: Secondary | ICD-10-CM | POA: Diagnosis not present

## 2017-11-24 DIAGNOSIS — I5020 Unspecified systolic (congestive) heart failure: Secondary | ICD-10-CM | POA: Diagnosis not present

## 2017-11-24 DIAGNOSIS — R739 Hyperglycemia, unspecified: Secondary | ICD-10-CM | POA: Diagnosis not present

## 2017-11-24 DIAGNOSIS — R350 Frequency of micturition: Secondary | ICD-10-CM | POA: Diagnosis not present

## 2017-11-24 DIAGNOSIS — Z23 Encounter for immunization: Secondary | ICD-10-CM

## 2017-11-24 DIAGNOSIS — N182 Chronic kidney disease, stage 2 (mild): Secondary | ICD-10-CM | POA: Diagnosis not present

## 2017-11-24 LAB — POCT URINALYSIS DIP (MANUAL ENTRY)
Bilirubin, UA: NEGATIVE
GLUCOSE UA: NEGATIVE mg/dL
Ketones, POC UA: NEGATIVE mg/dL
Leukocytes, UA: NEGATIVE
NITRITE UA: NEGATIVE
PH UA: 6.5 (ref 5.0–8.0)
PROTEIN UA: NEGATIVE mg/dL
RBC UA: NEGATIVE
Spec Grav, UA: 1.01 (ref 1.010–1.025)
UROBILINOGEN UA: 0.2 U/dL

## 2017-11-24 LAB — URIC ACID: URIC ACID, SERUM: 5.4 mg/dL (ref 4.0–7.8)

## 2017-11-24 LAB — HEMOGLOBIN A1C: Hgb A1c MFr Bld: 5 % (ref 4.6–6.5)

## 2017-11-24 NOTE — Assessment & Plan Note (Signed)
Continue allopurinol 100 mg daily.  Check uric acid level.

## 2017-11-24 NOTE — Assessment & Plan Note (Signed)
Last LDL 88.  Continue atorvastatin 20 mg daily.

## 2017-11-24 NOTE — Assessment & Plan Note (Signed)
Likely BPH. No red flags. UA negative. Check PSA. Refer to urology per pt request.

## 2017-11-24 NOTE — Assessment & Plan Note (Signed)
Check A1c. 

## 2017-11-24 NOTE — Patient Instructions (Addendum)
It was very nice to see you today!  We will send you to the urologist.  Your urine sample is normal.   We will check blood work today including your PSA.  Come back to see me in 6-12 months, or sooner as needed.   Take care, Dr Jerline Pain

## 2017-11-24 NOTE — Progress Notes (Signed)
   Subjective:  [name] [name] is a 67 y.o. male who presents today with a chief complaint of urinary frequency.   HPI:  Urinary Frequency Started several months ago. Worsened recently. Worse at night. Some hesitancy. No dysuria or discharge. He is concerned about his prostate. No dribbling. No abdominal pain. No nausea or vomiting.   Gout Stable. On allopurinol 100mg  daily and tolerating well. No recent flares.  HTN Stable on Coreg 25 mg twice daily, hydralazine 75 mg twice daily, lisinopril 10 mg daily, aspirin lactone 25 mg daily.  No reported chest pain or shortness of breath.  HLD Stable on atorvastatin 20 mg daily.  ROS: Per HPI  PMH: He reports that he has never smoked. He has never used smokeless tobacco. He reports that he has current or past drug history. Drug: Marijuana. He reports that he does not drink alcohol.  Objective:  Physical Exam: BP 124/80 (BP Location: Left Arm, Patient Position: Sitting, Cuff Size: Large)   Pulse 68   Temp 98.1 F (36.7 C) (Oral)   Ht 6\' 4"  (1.93 m)   Wt 272 lb 3.2 oz (123.5 kg)   SpO2 98%   BMI 33.13 kg/m   Wt Readings from Last 3 Encounters:  11/24/17 272 lb 3.2 oz (123.5 kg)  08/23/17 268 lb 6.4 oz (121.7 kg)  08/10/17 266 lb 6 oz (120.8 kg)  Gen: NAD, resting comfortably CV: RRR with no murmurs appreciated Pulm: NWOB, CTAB with no crackles, wheezes, or rhonchi GI: Normal bowel sounds present. Soft, Nontender, Nondistended.  Results for orders placed or performed in visit on 11/24/17 (from the past 24 hour(s))  POCT urinalysis dipstick     Status: Abnormal   Collection Time: 11/24/17  8:36 AM  Result Value Ref Range   Color, UA yellow yellow   Clarity, UA cloudy (A) clear   Glucose, UA negative negative mg/dL   Bilirubin, UA negative negative   Ketones, POC UA negative negative mg/dL   Spec Grav, UA 1.010 1.010 - 1.025   Blood, UA negative negative   pH, UA 6.5 5.0 - 8.0   Protein Ur, POC negative negative mg/dL   Urobilinogen, UA 0.2 0.2 or 1.0 E.U./dL   Nitrite, UA Negative Negative   Leukocytes, UA Negative Negative    Assessment/Plan:  Urinary frequency Likely BPH. No red flags. UA negative. Check PSA. Refer to urology per pt request.   Hyperlipidemia Last LDL 88.  Continue atorvastatin 20 mg daily.  Hyperglycemia Check A1c.  Gout Continue allopurinol 100 mg daily.  Check uric acid level.  Benign hypertensive heart and kidney disease with systolic CHF, NYHA class 2 and CKD stage 2 (HCC) At goal.  Continue current doses of Coreg 25 mg twice daily, hydralazine 75 mg twice daily, lisinopril 10 mg daily, and spinal lactone 25 mg daily.  Preventative Healthcare Flu shot given today.  Algis Greenhouse. Jerline Pain, MD 11/24/2017 8:41 AM

## 2017-11-24 NOTE — Progress Notes (Signed)
Please inform patient of the following:  A1c and uric acid levels are normal.  Unfortunately his PSA level was not drawn. Would like for him to come back to recheck or he can wait until his urology appointment.  Algis Greenhouse. Jerline Pain, MD 11/24/2017 5:07 PM

## 2017-11-24 NOTE — Assessment & Plan Note (Signed)
At goal.  Continue current doses of Coreg 25 mg twice daily, hydralazine 75 mg twice daily, lisinopril 10 mg daily, and spinal lactone 25 mg daily.

## 2017-12-28 ENCOUNTER — Other Ambulatory Visit: Payer: Self-pay | Admitting: Cardiovascular Disease

## 2017-12-28 MED ORDER — ATORVASTATIN CALCIUM 20 MG PO TABS: 20.0000 mg | ORAL_TABLET | Freq: Every day | ORAL | 2 refills | Status: DC

## 2018-01-01 ENCOUNTER — Other Ambulatory Visit: Payer: Self-pay

## 2018-01-01 MED ORDER — ALLOPURINOL 100 MG PO TABS: 100.0000 mg | ORAL_TABLET | Freq: Every day | ORAL | 3 refills | Status: DC

## 2018-01-09 DIAGNOSIS — N401 Enlarged prostate with lower urinary tract symptoms: Secondary | ICD-10-CM | POA: Diagnosis not present

## 2018-01-09 DIAGNOSIS — R350 Frequency of micturition: Secondary | ICD-10-CM | POA: Diagnosis not present

## 2018-01-09 DIAGNOSIS — R3912 Poor urinary stream: Secondary | ICD-10-CM | POA: Diagnosis not present

## 2018-01-11 ENCOUNTER — Other Ambulatory Visit: Payer: Self-pay | Admitting: Family Medicine

## 2018-01-11 ENCOUNTER — Other Ambulatory Visit: Payer: Self-pay | Admitting: Cardiovascular Disease

## 2018-01-29 ENCOUNTER — Encounter: Payer: Self-pay | Admitting: Cardiovascular Disease

## 2018-01-29 ENCOUNTER — Ambulatory Visit: Payer: Medicare HMO | Admitting: Cardiovascular Disease

## 2018-01-29 VITALS — BP 120/72 | HR 73 | Ht 76.0 in | Wt 271.0 lb

## 2018-01-29 DIAGNOSIS — E782 Mixed hyperlipidemia: Secondary | ICD-10-CM

## 2018-01-29 DIAGNOSIS — H52209 Unspecified astigmatism, unspecified eye: Secondary | ICD-10-CM | POA: Diagnosis not present

## 2018-01-29 DIAGNOSIS — I5022 Chronic systolic (congestive) heart failure: Secondary | ICD-10-CM

## 2018-01-29 DIAGNOSIS — H5203 Hypermetropia, bilateral: Secondary | ICD-10-CM | POA: Diagnosis not present

## 2018-01-29 DIAGNOSIS — H524 Presbyopia: Secondary | ICD-10-CM | POA: Diagnosis not present

## 2018-01-29 DIAGNOSIS — H5213 Myopia, bilateral: Secondary | ICD-10-CM | POA: Diagnosis not present

## 2018-01-29 LAB — LIPID PANEL
CHOLESTEROL TOTAL: 140 mg/dL (ref 100–199)
Chol/HDL Ratio: 3.6 ratio (ref 0.0–5.0)
HDL: 39 mg/dL — ABNORMAL LOW (ref >39)
LDL CALC: 77 mg/dL (ref 0–99)
TRIGLYCERIDES: 122 mg/dL (ref 0–149)
VLDL Cholesterol Cal: 24 mg/dL (ref 5–40)

## 2018-01-29 LAB — BASIC METABOLIC PANEL
BUN/Creatinine Ratio: 15 (ref 10–24)
BUN: 21 mg/dL (ref 8–27)
CALCIUM: 9.9 mg/dL (ref 8.6–10.2)
CO2: 23 mmol/L (ref 20–29)
Chloride: 103 mmol/L (ref 96–106)
Creatinine, Ser: 1.39 mg/dL — ABNORMAL HIGH (ref 0.76–1.27)
GFR calc Af Amer: 61 mL/min/{1.73_m2} (ref >59)
GFR calc non Af Amer: 53 mL/min/{1.73_m2} — ABNORMAL LOW (ref >59)
Glucose: 116 mg/dL — ABNORMAL HIGH (ref 65–99)
Potassium: 4.7 mmol/L (ref 3.5–5.2)
Sodium: 142 mmol/L (ref 134–144)

## 2018-01-29 LAB — HEPATIC FUNCTION PANEL
ALT: 18 IU/L (ref 0–44)
AST: 22 IU/L (ref 0–40)
Albumin: 3.9 g/dL (ref 3.6–4.8)
Alkaline Phosphatase: 69 IU/L (ref 39–117)
BILIRUBIN TOTAL: 0.7 mg/dL (ref 0.0–1.2)
Bilirubin, Direct: 0.22 mg/dL (ref 0.00–0.40)
Total Protein: 6.5 g/dL (ref 6.0–8.5)
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
## 2018-01-29 NOTE — Patient Instructions (Signed)
Medication Instructions:  Your physician recommends that you continue on your current medications as directed. Please refer to the Current Medication list given to you today.  If you need a refill on your cardiac medications before your next appointment, please call your pharmacy.    Lab work: TODAY - cholesterol, liver panel, basic metabolic panel  If you have labs (blood work) drawn today and your tests are completely normal, you will receive your results only by: . MyChart Message (if you have MyChart) OR . A paper copy in the mail If you have any lab test that is abnormal or we need to change your treatment, we will call you to review the results.   Testing/Procedures: None Ordered   Follow-Up: At [facility], you and your health needs are our priority.  As part of our continuing mission to provide you with exceptional heart care, we have created designated Provider Care Teams.  These Care Teams include your primary Cardiologist (physician) and Advanced Practice Providers (APPs -  Physician Assistants and Nurse Practitioners) who all work together to provide you with the care you need, when you need it. You will need a follow up appointment in:  6 months.  Please call our office 2 months in advance to schedule this appointment.  You may see Philip Nahser, MD or one of the following Advanced Practice Providers on your designated Care Team: Scott Weaver, PA-C Vin Bhagat, PA-C . Janine Hammond, NP   

## 2018-01-31 DIAGNOSIS — H903 Sensorineural hearing loss, bilateral: Secondary | ICD-10-CM | POA: Diagnosis not present

## 2018-02-12 ENCOUNTER — Other Ambulatory Visit: Payer: Self-pay | Admitting: Cardiovascular Disease

## 2018-04-03 DIAGNOSIS — N401 Enlarged prostate with lower urinary tract symptoms: Secondary | ICD-10-CM | POA: Diagnosis not present

## 2018-04-03 DIAGNOSIS — R350 Frequency of micturition: Secondary | ICD-10-CM | POA: Diagnosis not present

## 2018-04-03 DIAGNOSIS — N5201 Erectile dysfunction due to arterial insufficiency: Secondary | ICD-10-CM | POA: Diagnosis not present

## 2018-04-16 ENCOUNTER — Other Ambulatory Visit: Payer: Self-pay | Admitting: Cardiovascular Disease

## 2018-07-12 ENCOUNTER — Other Ambulatory Visit: Payer: Self-pay | Admitting: Cardiovascular Disease

## 2018-07-19 ENCOUNTER — Other Ambulatory Visit: Payer: Self-pay | Admitting: Cardiovascular Disease

## 2018-08-13 ENCOUNTER — Ambulatory Visit: Payer: Medicare HMO

## 2018-08-20 ENCOUNTER — Ambulatory Visit: Payer: Medicare HMO | Admitting: Family Medicine

## 2018-10-17 ENCOUNTER — Other Ambulatory Visit: Payer: Self-pay | Admitting: Cardiovascular Disease

## 2019-01-15 ENCOUNTER — Telehealth: Payer: Self-pay | Admitting: Family Medicine

## 2019-01-15 NOTE — Telephone Encounter (Signed)
FYI

## 2019-01-15 NOTE — Telephone Encounter (Signed)
FYI. I called the patient to schedule AWV with Courtney.  He said that he has moved back to the [place] and has established with a PCP there.  He wanted to thank you for all that you've done for him.

## 2019-09-27 ENCOUNTER — Telehealth: Payer: Self-pay | Admitting: Cardiovascular Disease

## 2019-09-27 NOTE — Telephone Encounter (Signed)
Patient states he has moved to the [place] and has a new cardiologist.
